# Patient Record
Sex: Male | Born: 1950 | Race: White | Hispanic: No | Smoking: Former smoker
Health system: Southern US, Community
[De-identification: ages and names within clinical notes are randomized; demographics above are authoritative.]

## PROBLEM LIST (undated history)

## (undated) ENCOUNTER — Emergency Department (HOSPITAL_COMMUNITY): Payer: Medicare PPO

## (undated) DIAGNOSIS — I10 Essential (primary) hypertension: Secondary | ICD-10-CM

## (undated) DIAGNOSIS — I82409 Acute embolism and thrombosis of unspecified deep veins of unspecified lower extremity: Secondary | ICD-10-CM

## (undated) DIAGNOSIS — I639 Cerebral infarction, unspecified: Secondary | ICD-10-CM

## (undated) DIAGNOSIS — I809 Phlebitis and thrombophlebitis of unspecified site: Secondary | ICD-10-CM

## (undated) DIAGNOSIS — F32A Depression, unspecified: Secondary | ICD-10-CM

## (undated) DIAGNOSIS — E785 Hyperlipidemia, unspecified: Secondary | ICD-10-CM

## (undated) DIAGNOSIS — I517 Cardiomegaly: Secondary | ICD-10-CM

## (undated) DIAGNOSIS — J189 Pneumonia, unspecified organism: Secondary | ICD-10-CM

## (undated) DIAGNOSIS — M5430 Sciatica, unspecified side: Secondary | ICD-10-CM

## (undated) DIAGNOSIS — K219 Gastro-esophageal reflux disease without esophagitis: Secondary | ICD-10-CM

## (undated) DIAGNOSIS — M5431 Sciatica, right side: Secondary | ICD-10-CM

## (undated) DIAGNOSIS — G4730 Sleep apnea, unspecified: Secondary | ICD-10-CM

## (undated) DIAGNOSIS — J690 Pneumonitis due to inhalation of food and vomit: Secondary | ICD-10-CM

## (undated) DIAGNOSIS — Z9981 Dependence on supplemental oxygen: Secondary | ICD-10-CM

## (undated) DIAGNOSIS — F419 Anxiety disorder, unspecified: Secondary | ICD-10-CM

## (undated) DIAGNOSIS — M1990 Unspecified osteoarthritis, unspecified site: Secondary | ICD-10-CM

## (undated) DIAGNOSIS — F329 Major depressive disorder, single episode, unspecified: Secondary | ICD-10-CM

## (undated) DIAGNOSIS — N200 Calculus of kidney: Secondary | ICD-10-CM

## (undated) DIAGNOSIS — E119 Type 2 diabetes mellitus without complications: Secondary | ICD-10-CM

## (undated) HISTORY — PX: NASAL SEPTUM SURGERY: SHX37

## (undated) HISTORY — PX: KNEE CARTILAGE SURGERY: SHX688

## (undated) HISTORY — PX: FRACTURE SURGERY: SHX138

## (undated) HISTORY — PX: TONSILLECTOMY: SUR1361

## (undated) HISTORY — DX: Type 2 diabetes mellitus without complications: E11.9

---

## 1970-12-25 HISTORY — PX: FEMUR FRACTURE SURGERY: SHX633

## 2014-04-18 ENCOUNTER — Encounter (HOSPITAL_BASED_OUTPATIENT_CLINIC_OR_DEPARTMENT_OTHER): Payer: Self-pay | Admitting: Emergency Medicine

## 2014-04-18 ENCOUNTER — Emergency Department (HOSPITAL_BASED_OUTPATIENT_CLINIC_OR_DEPARTMENT_OTHER)
Admission: EM | Admit: 2014-04-18 | Discharge: 2014-04-18 | Disposition: A | Discharge status: Home / Self Care | Payer: Self-pay | Attending: Emergency Medicine | Admitting: Emergency Medicine

## 2014-04-18 DIAGNOSIS — T40605A Adverse effect of unspecified narcotics, initial encounter: Secondary | ICD-10-CM | POA: Insufficient documentation

## 2014-04-18 DIAGNOSIS — I10 Essential (primary) hypertension: Secondary | ICD-10-CM | POA: Insufficient documentation

## 2014-04-18 DIAGNOSIS — Z87891 Personal history of nicotine dependence: Secondary | ICD-10-CM | POA: Insufficient documentation

## 2014-04-18 DIAGNOSIS — T50905A Adverse effect of unspecified drugs, medicaments and biological substances, initial encounter: Secondary | ICD-10-CM

## 2014-04-18 DIAGNOSIS — M5430 Sciatica, unspecified side: Secondary | ICD-10-CM | POA: Insufficient documentation

## 2014-04-18 HISTORY — DX: Sleep apnea, unspecified: G47.30

## 2014-04-18 HISTORY — DX: Phlebitis and thrombophlebitis of unspecified site: I80.9

## 2014-04-18 HISTORY — DX: Sciatica, unspecified side: M54.30

## 2014-04-18 HISTORY — DX: Cardiomegaly: I51.7

## 2014-04-18 HISTORY — DX: Essential (primary) hypertension: I10

## 2014-04-18 MED ORDER — LISINOPRIL 40 MG PO TABS: 40.0000 mg | ORAL_TABLET | Freq: Every day | ORAL | Status: DC

## 2014-04-18 MED ORDER — HYDROCODONE-ACETAMINOPHEN 5-325 MG PO TABS: 1.0000 | ORAL_TABLET | Freq: Four times a day (QID) | ORAL | Status: DC | PRN

## 2014-04-18 MED ORDER — DIAZEPAM 5 MG PO TABS: 5.0000 mg | ORAL_TABLET | Freq: Three times a day (TID) | ORAL | Status: DC | PRN

## 2014-04-18 NOTE — ED Provider Notes (Signed)
CSN: [id number]     Arrival date & time 04/18/14  0413 History   First MD Initiated Contact with Patient 04/18/14 [phone]     Chief Complaint  Patient presents with  . Anxiety     (Consider location/radiation/quality/duration/timing/severity/associated sxs/prior Treatment) HPI This is a 63 year old male with a history of sciatica. His episodes of sciatica usually last about 3 days and resolve on their own. He's been having sciatic pain bilaterally, right greater than left, for about 2 weeks now. He had been taking 800 mg of ibuprofen 3 times a day. For the last 3 days he has been taking his mother's oxycodone tablets as needed for acute exacerbations of the pain. Since about 2:00 this morning he has felt anxious, agitated, fearful of going to sleep and claustrophobic. He is not accustomed to such symptoms and attributes it to the oxycodone. He states he has not taken any narcotic in years. Ortho-last date controlled substances data base shows no controlled substances dispensed to him in the past year.   Past Medical History  Diagnosis Date  . Sciatica   . Hypertension   . Enlarged heart   . Phlebitis   . Sleep apnea    Past Surgical History  Procedure Laterality Date  . Knee cartilage surgery    . Femur fracture surgery     History reviewed. No pertinent family history. History  Substance Use Topics  . Smoking status: Former Games developermoker  . Smokeless tobacco: Not on file  . Alcohol Use: Yes    Review of Systems  All other systems reviewed and are negative.   Allergies  Ivp dye  Home Medications   Prior to Admission medications   Medication Sig Start Date End Date Taking? Authorizing Provider  lisinopril (PRINIVIL,ZESTRIL) 40 MG tablet Take 40 mg by mouth daily.    Historical Provider, MD   BP 175/82  Pulse 74  Temp(Src) 97.8 F (36.6 C) (Oral)  Resp 20  Ht 5' 11.5" (1.816 m)  Wt 275 lb (124.739 kg)  BMI 37.82 kg/m2  SpO2 98%  Physical Exam General: Well-developed,  well-nourished male in no acute distress; appearance consistent with age of record HENT: normocephalic; atraumatic Eyes: pupils equal, round and reactive to light; extraocular muscles intact Neck: supple Heart: regular rate and rhythm Lungs: clear to auscultation bilaterally Abdomen: soft; nondistended Back: Mild right sciatic notch tenderness; negative left straight leg raise; positive right straight leg raise at about 75 Extremities: No deformity; full range of motion; pulses normal Neurologic: Awake, alert and oriented; motor function intact in all extremities and symmetric; no facial droop Skin: Warm and dry Psychiatric: Anxious    ED Course  Procedures (including critical care time)   MDM  We will have him discontinue the oxycodone as this may be causing his anxiety. We will have him take hydrocodone as this may be more tolerated.    Hanley SeamenJohn L Shlok Raz, MD 04/18/14 [phone]

## 2014-04-18 NOTE — ED Notes (Signed)
Pt ambulating independently w/ steady gait on d/c in no acute distress, A&Ox4. D/c instructions reviewed w/ pt - pt denies any further questions or concerns at present. Rx given x3  

## 2014-04-18 NOTE — ED Notes (Signed)
Pt reports hx of sciatica pain - pt recently tried taking his mother's oxycodone x3 days, today pt has been experiencing anxiety and feeling "jittery" - pt attributing this to possible side effects of medication. Pt has also been of BP medication x1 month.

## 2014-10-27 ENCOUNTER — Encounter (HOSPITAL_BASED_OUTPATIENT_CLINIC_OR_DEPARTMENT_OTHER): Payer: Self-pay

## 2014-10-27 ENCOUNTER — Emergency Department (HOSPITAL_BASED_OUTPATIENT_CLINIC_OR_DEPARTMENT_OTHER): Payer: Self-pay

## 2014-10-27 ENCOUNTER — Emergency Department (HOSPITAL_BASED_OUTPATIENT_CLINIC_OR_DEPARTMENT_OTHER)
Admission: EM | Admit: 2014-10-27 | Discharge: 2014-10-27 | Disposition: A | Discharge status: Home / Self Care | Payer: Self-pay | Attending: Emergency Medicine | Admitting: Emergency Medicine

## 2014-10-27 DIAGNOSIS — Z8739 Personal history of other diseases of the musculoskeletal system and connective tissue: Secondary | ICD-10-CM | POA: Insufficient documentation

## 2014-10-27 DIAGNOSIS — I10 Essential (primary) hypertension: Secondary | ICD-10-CM | POA: Insufficient documentation

## 2014-10-27 DIAGNOSIS — Z79899 Other long term (current) drug therapy: Secondary | ICD-10-CM | POA: Insufficient documentation

## 2014-10-27 DIAGNOSIS — Z87891 Personal history of nicotine dependence: Secondary | ICD-10-CM | POA: Insufficient documentation

## 2014-10-27 DIAGNOSIS — R0602 Shortness of breath: Secondary | ICD-10-CM | POA: Insufficient documentation

## 2014-10-27 LAB — COMPREHENSIVE METABOLIC PANEL
ALT: 17 U/L (ref 0–53)
AST: 24 U/L (ref 0–37)
Albumin: 4 g/dL (ref 3.5–5.2)
Alkaline Phosphatase: 70 U/L (ref 39–117)
Anion gap: 12 (ref 5–15)
BUN: 11 mg/dL (ref 6–23)
CO2: 27 mEq/L (ref 19–32)
Calcium: 9.8 mg/dL (ref 8.4–10.5)
Chloride: 101 mEq/L (ref 96–112)
Creatinine, Ser: 1 mg/dL (ref 0.50–1.35)
GFR calc Af Amer: >90 mL/min (ref >90)
GFR calc non Af Amer: 78 mL/min — ABNORMAL LOW (ref >90)
Glucose, Bld: 130 mg/dL — ABNORMAL HIGH (ref 70–99)
Potassium: 4.9 mEq/L (ref 3.7–5.3)
Sodium: 140 mEq/L (ref 137–147)
Total Bilirubin: 0.5 mg/dL (ref 0.3–1.2)
Total Protein: 7.9 g/dL (ref 6.0–8.3)

## 2014-10-27 LAB — CBC WITH DIFFERENTIAL/PLATELET
Basophils Absolute: 0 10*3/uL (ref 0.0–0.1)
Basophils Relative: 1 % (ref 0–1)
Eosinophils Absolute: 0.4 10*3/uL (ref 0.0–0.7)
Eosinophils Relative: 5 % (ref 0–5)
HCT: 44.1 % (ref 39.0–52.0)
Hemoglobin: 14.7 g/dL (ref 13.0–17.0)
Lymphocytes Relative: 16 % (ref 12–46)
Lymphs Abs: 1.4 10*3/uL (ref 0.7–4.0)
MCH: 31.2 pg (ref 26.0–34.0)
MCHC: 33.3 g/dL (ref 30.0–36.0)
MCV: 93.6 fL (ref 78.0–100.0)
Monocytes Absolute: 0.5 10*3/uL (ref 0.1–1.0)
Monocytes Relative: 6 % (ref 3–12)
Neutro Abs: 6.5 10*3/uL (ref 1.7–7.7)
Neutrophils Relative %: 72 % (ref 43–77)
Platelets: 227 10*3/uL (ref 150–400)
RBC: 4.71 MIL/uL (ref 4.22–5.81)
RDW: 13.8 % (ref 11.5–15.5)
WBC: 8.8 10*3/uL (ref 4.0–10.5)

## 2014-10-27 LAB — TROPONIN I: Troponin I: <0.3 ng/mL (ref <0.30)

## 2014-10-27 LAB — D-DIMER, QUANTITATIVE: D-Dimer, Quant: 0.32 ug/mL-FEU (ref 0.00–0.48)

## 2014-10-27 LAB — PRO B NATRIURETIC PEPTIDE: Pro B Natriuretic peptide (BNP): 145.9 pg/mL — ABNORMAL HIGH (ref 0–125)

## 2014-10-27 MED ORDER — LORAZEPAM 2 MG/ML IJ SOLN
1.0000 mg | Freq: Once | INTRAMUSCULAR | Status: AC
  Administered 2014-10-27: 1 mg via INTRAVENOUS
  Filled 2014-10-27: qty 1

## 2014-10-27 NOTE — Discharge Instructions (Signed)

## 2014-10-27 NOTE — ED Notes (Signed)
Walked into the room and the pt was very anxious and standing up. Pt stated that he just couldn't get comfortable and was very anxious. Pt stated that he closed his eyes and woke up and was startled and stated that he was breathing hard. Pt stated that he is scared to go to sleep because he is afraid that he will stop breathing.

## 2014-10-27 NOTE — ED Provider Notes (Signed)
CSN: [id number]     Arrival date & time 10/27/14  0820 History   First MD Initiated Contact with Patient 10/27/14 0857     Chief Complaint  Patient presents with  . Shortness of Breath     (Consider location/radiation/quality/duration/timing/severity/associated sxs/prior Treatment) HPI Comments: Patient is a 63 year old male with history of hypertension, DVT, and a "enlarged heart". He presents with complaints of shortness of breath that started yesterday evening around 9:00. He states that he is having increased discomfort from sciatica and took 2 of his mother's Percocet. When he tried to lie flat he became short of breath and "stops breathing". He is been awake all night as he has been afraid to go to sleep. He presents this morning for evaluation of the above complaints. He denies to me he is having any chest pain. He denies any fevers or chills. He denies any productive cough.   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  Patient is a 63 y.o. male presenting with shortness of breath. The history is provided by the patient.  Shortness of Breath Severity:  Moderate Onset quality:  Sudden Duration:  12 hours Timing:  Constant Progression:  Unchanged Chronicity:  New Relieved by:  Nothing Ineffective treatments:  None tried Associated symptoms: no chest pain, no cough and no fever     Past Medical History  Diagnosis Date  . Sciatica   . Hypertension   . Enlarged heart   . Phlebitis   . Sleep apnea    Past Surgical History  Procedure Laterality Date  . Knee cartilage surgery    . Femur fracture surgery     No family history on file. History  Substance Use Topics  . Smoking status: Former Smoker    Quit date: 10/27/1992  . Smokeless tobacco: Never Used  . Alcohol Use: Yes     Comment: Pt stated that he drinks scotch and wine on weekends    Review of Systems  Constitutional: Negative for fever.   Respiratory: Positive for shortness of breath. Negative for cough.   Cardiovascular: Negative for chest pain.  All other systems reviewed and are negative.     Allergies  Ivp dye  Home Medications   Prior to Admission medications   Medication Sig Start Date End Date Taking? Authorizing Provider  oxyCODONE (ROXICODONE) 15 MG immediate release tablet Take 15 mg by mouth every 4 (four) hours as needed for pain (Pt stated that he took oxycodone last evening at 1700 and again at midnight today for sciatica).   Yes Historical Provider, MD  lisinopril (PRINIVIL,ZESTRIL) 40 MG tablet Take 40 mg by mouth daily.    Historical Provider, MD  lisinopril (PRINIVIL,ZESTRIL) 40 MG tablet Take 1 tablet (40 mg total) by mouth daily. 04/18/14   John L Molpus, MD   BP 188/97 mmHg  Pulse 66  Temp(Src) 98.5 F (36.9 C) (Oral)  Resp 22  Ht 5\' 11"  (1.803 m)  Wt 280 lb (127.007 kg)  BMI 39.07 kg/m2  SpO2 100% Physical Exam  Constitutional: He is oriented to person, place, and time. He appears well-developed and well-nourished. No distress.  HENT:  Head: Normocephalic and atraumatic.  Neck: Normal range of motion. Neck supple.  Cardiovascular: Normal rate, regular rhythm and normal heart sounds.   No murmur heard. Pulmonary/Chest: Effort normal and breath sounds normal. No respiratory distress. He has no wheezes.  Abdominal: Soft. Bowel sounds  are normal.  Musculoskeletal:  The left lower extremity is enlarged in comparison to the right lower extremity. The patient tells me that this is normal for him. There is no pitting edema present. There is no calf tenderness bilaterally and Homans sign is absent bilaterally.  Neurological: He is alert and oriented to person, place, and time.  Skin: Skin is warm and dry. He is not diaphoretic.  Nursing note and vitals reviewed.   ED Course  Procedures (including critical care time) Labs Review Labs Reviewed  COMPREHENSIVE METABOLIC PANEL  CBC WITH  DIFFERENTIAL  D-DIMER, QUANTITATIVE  TROPONIN I  PRO B NATRIURETIC PEPTIDE    Imaging Review No results found.   EKG Interpretation   Date/Time:  Tuesday October 27 2014 09:05:55 EST Ventricular Rate:  69 PR Interval:  170 QRS Duration: 100 QT Interval:  428 QTC Calculation: 458 R Axis:   -12 Text Interpretation:  Sinus rhythm with occasional Premature ventricular  complexes Otherwise normal ECG Confirmed by DELOS  MD, Lavonne Kinderman ([id number]) on  10/27/2014 9:48:56 AM      MDM   Final diagnoses:  None    Patient presents with complaints of difficulty breathing. He states this started last night when he attempted to lie down. He had just taken 2 of his mother's Percocet prior to the onset of symptoms and it is possible that this could be a reaction to the medication. His workup reveals no evidence for a cardiopulmonary abnormality. Considered and ruled out are pulmonary embolism, congestive heart failure, pneumonia. What he describes sounds like sleep apnea. He has had this in the past, however could not complete a sleep study due to anxiety. He appears to be feeling better and oxygen saturations have remained stable. He was able to rest and sleep here for a little while without difficulty. At this point I feel as though he is appropriate for discharge, to follow-up with his doctor and return as needed if he worsens.    Geoffery Lyonsouglas Daritza Brees, MD 10/27/14 1128

## 2014-10-27 NOTE — ED Notes (Signed)
MD at bedside with this RN 

## 2014-10-27 NOTE — ED Notes (Signed)
Pt stated that he started having difficulty breathing last night around 2100. Pt stated that he started using his mother's oxygen bottles set at 3 Liters and it helped until this morning around 0200 the pt woke up and stated that his face was "feeling funny" and he was scared to go back to sleep. The pt stated that he got up at 0400 and is short of breath. Pt also stated that he took an oxycodone at 1700 yesterday afternoon and then another one at midnight for sciatica/back pain.

## 2015-12-10 ENCOUNTER — Inpatient Hospital Stay (HOSPITAL_COMMUNITY)
Admission: EM | Admit: 2015-12-10 | Discharge: 2015-12-12 | DRG: 064 | Disposition: A | Discharge status: Home / Self Care | Payer: Medicare Other | Attending: Internal Medicine | Admitting: Internal Medicine

## 2015-12-10 ENCOUNTER — Inpatient Hospital Stay (HOSPITAL_COMMUNITY): Payer: Medicare Other

## 2015-12-10 ENCOUNTER — Emergency Department (HOSPITAL_COMMUNITY): Payer: Medicare Other

## 2015-12-10 ENCOUNTER — Encounter (HOSPITAL_COMMUNITY): Payer: Self-pay | Admitting: Emergency Medicine

## 2015-12-10 DIAGNOSIS — R471 Dysarthria and anarthria: Secondary | ICD-10-CM | POA: Diagnosis present

## 2015-12-10 DIAGNOSIS — G8929 Other chronic pain: Secondary | ICD-10-CM | POA: Diagnosis present

## 2015-12-10 DIAGNOSIS — Z91041 Radiographic dye allergy status: Secondary | ICD-10-CM

## 2015-12-10 DIAGNOSIS — G4733 Obstructive sleep apnea (adult) (pediatric): Secondary | ICD-10-CM | POA: Diagnosis present

## 2015-12-10 DIAGNOSIS — M25551 Pain in right hip: Secondary | ICD-10-CM | POA: Diagnosis present

## 2015-12-10 DIAGNOSIS — I6339 Cerebral infarction due to thrombosis of other cerebral artery: Secondary | ICD-10-CM

## 2015-12-10 DIAGNOSIS — E669 Obesity, unspecified: Secondary | ICD-10-CM | POA: Diagnosis present

## 2015-12-10 DIAGNOSIS — G8194 Hemiplegia, unspecified affecting left nondominant side: Secondary | ICD-10-CM | POA: Diagnosis present

## 2015-12-10 DIAGNOSIS — K219 Gastro-esophageal reflux disease without esophagitis: Secondary | ICD-10-CM | POA: Diagnosis present

## 2015-12-10 DIAGNOSIS — R0602 Shortness of breath: Secondary | ICD-10-CM

## 2015-12-10 DIAGNOSIS — I10 Essential (primary) hypertension: Secondary | ICD-10-CM | POA: Diagnosis not present

## 2015-12-10 DIAGNOSIS — F1410 Cocaine abuse, uncomplicated: Secondary | ICD-10-CM | POA: Diagnosis present

## 2015-12-10 DIAGNOSIS — I6789 Other cerebrovascular disease: Secondary | ICD-10-CM

## 2015-12-10 DIAGNOSIS — W19XXXA Unspecified fall, initial encounter: Secondary | ICD-10-CM | POA: Diagnosis present

## 2015-12-10 DIAGNOSIS — M5430 Sciatica, unspecified side: Secondary | ICD-10-CM | POA: Diagnosis present

## 2015-12-10 DIAGNOSIS — I739 Peripheral vascular disease, unspecified: Secondary | ICD-10-CM | POA: Diagnosis present

## 2015-12-10 DIAGNOSIS — J9601 Acute respiratory failure with hypoxia: Secondary | ICD-10-CM | POA: Diagnosis not present

## 2015-12-10 DIAGNOSIS — E785 Hyperlipidemia, unspecified: Secondary | ICD-10-CM | POA: Diagnosis present

## 2015-12-10 DIAGNOSIS — E119 Type 2 diabetes mellitus without complications: Secondary | ICD-10-CM | POA: Diagnosis present

## 2015-12-10 DIAGNOSIS — Z6836 Body mass index (BMI) 36.0-36.9, adult: Secondary | ICD-10-CM

## 2015-12-10 DIAGNOSIS — J690 Pneumonitis due to inhalation of food and vomit: Secondary | ICD-10-CM | POA: Diagnosis present

## 2015-12-10 DIAGNOSIS — I639 Cerebral infarction, unspecified: Secondary | ICD-10-CM

## 2015-12-10 DIAGNOSIS — Z87891 Personal history of nicotine dependence: Secondary | ICD-10-CM

## 2015-12-10 DIAGNOSIS — D72829 Elevated white blood cell count, unspecified: Secondary | ICD-10-CM | POA: Diagnosis present

## 2015-12-10 DIAGNOSIS — Z79899 Other long term (current) drug therapy: Secondary | ICD-10-CM

## 2015-12-10 HISTORY — DX: Pneumonitis due to inhalation of food and vomit: J69.0

## 2015-12-10 HISTORY — DX: Cerebral infarction, unspecified: I63.9

## 2015-12-10 LAB — I-STAT CHEM 8, ED
BUN: 16 mg/dL (ref 6–20)
Calcium, Ion: 1.22 mmol/L (ref 1.13–1.30)
Chloride: 102 mmol/L (ref 101–111)
Creatinine, Ser: 0.9 mg/dL (ref 0.61–1.24)
Glucose, Bld: 118 mg/dL — ABNORMAL HIGH (ref 65–99)
HCT: 46 % (ref 39.0–52.0)
Hemoglobin: 15.6 g/dL (ref 13.0–17.0)
Potassium: 4.3 mmol/L (ref 3.5–5.1)
Sodium: 141 mmol/L (ref 135–145)
TCO2: 27 mmol/L (ref 0–100)

## 2015-12-10 LAB — CBC WITH DIFFERENTIAL/PLATELET
Basophils Absolute: 0 10*3/uL (ref 0.0–0.1)
Basophils Relative: 0 %
EOS PCT: 1 %
Eosinophils Absolute: 0.3 10*3/uL (ref 0.0–0.7)
HEMATOCRIT: 41.5 % (ref 39.0–52.0)
HEMOGLOBIN: 13.4 g/dL (ref 13.0–17.0)
LYMPHS PCT: 5 %
Lymphs Abs: 1.4 10*3/uL (ref 0.7–4.0)
MCH: 29.8 pg (ref 26.0–34.0)
MCHC: 32.3 g/dL (ref 30.0–36.0)
MCV: 92.2 fL (ref 78.0–100.0)
MONO ABS: 2 10*3/uL — AB (ref 0.1–1.0)
MONOS PCT: 7 %
NEUTROS PCT: 87 %
Neutro Abs: 24.8 10*3/uL — ABNORMAL HIGH (ref 1.7–7.7)
PLATELETS: 255 10*3/uL (ref 150–400)
RBC: 4.5 MIL/uL (ref 4.22–5.81)
RDW: 14.7 % (ref 11.5–15.5)
WBC MORPHOLOGY: INCREASED
WBC: 28.5 10*3/uL — AB (ref 4.0–10.5)

## 2015-12-10 LAB — COMPREHENSIVE METABOLIC PANEL
ALBUMIN: 3.5 g/dL (ref 3.5–5.0)
ALBUMIN: 3.1 g/dL — AB (ref 3.5–5.0)
ALK PHOS: 66 U/L (ref 38–126)
ALT: 18 U/L (ref 17–63)
ALT: 19 U/L (ref 17–63)
ANION GAP: 9 (ref 5–15)
ANION GAP: 9 (ref 5–15)
AST: 27 U/L (ref 15–41)
AST: 23 U/L (ref 15–41)
Alkaline Phosphatase: 65 U/L (ref 38–126)
BILIRUBIN TOTAL: 0.9 mg/dL (ref 0.3–1.2)
BUN: 13 mg/dL (ref 6–20)
BUN: 12 mg/dL (ref 6–20)
CALCIUM: 9.2 mg/dL (ref 8.9–10.3)
CHLORIDE: 104 mmol/L (ref 101–111)
CO2: 25 mmol/L (ref 22–32)
CO2: 26 mmol/L (ref 22–32)
Calcium: 9.4 mg/dL (ref 8.9–10.3)
Chloride: 104 mmol/L (ref 101–111)
Creatinine, Ser: 1.01 mg/dL (ref 0.61–1.24)
Creatinine, Ser: 0.99 mg/dL (ref 0.61–1.24)
GFR calc Af Amer: >60 mL/min (ref >60)
GFR calc Af Amer: >60 mL/min (ref >60)
GFR calc non Af Amer: >60 mL/min (ref >60)
GLUCOSE: 115 mg/dL — AB (ref 65–99)
GLUCOSE: 118 mg/dL — AB (ref 65–99)
POTASSIUM: 4.3 mmol/L (ref 3.5–5.1)
Potassium: 4.3 mmol/L (ref 3.5–5.1)
SODIUM: 139 mmol/L (ref 135–145)
Sodium: 138 mmol/L (ref 135–145)
TOTAL PROTEIN: 6.8 g/dL (ref 6.5–8.1)
Total Bilirubin: 0.8 mg/dL (ref 0.3–1.2)
Total Protein: 6.5 g/dL (ref 6.5–8.1)

## 2015-12-10 LAB — LIPID PANEL
CHOL/HDL RATIO: 4.6 ratio
Cholesterol: 123 mg/dL (ref 0–200)
HDL: 27 mg/dL — AB (ref >40)
LDL Cholesterol: 80 mg/dL (ref 0–99)
Triglycerides: 81 mg/dL (ref <150)
VLDL: 16 mg/dL (ref 0–40)

## 2015-12-10 LAB — URINALYSIS, ROUTINE W REFLEX MICROSCOPIC
Glucose, UA: NEGATIVE mg/dL
Hgb urine dipstick: NEGATIVE
KETONES UR: NEGATIVE mg/dL
LEUKOCYTES UA: NEGATIVE
NITRITE: NEGATIVE
PH: 7 (ref 5.0–8.0)
PROTEIN: NEGATIVE mg/dL
Specific Gravity, Urine: 1.029 (ref 1.005–1.030)

## 2015-12-10 LAB — DIFFERENTIAL
BASOS ABS: 0 10*3/uL (ref 0.0–0.1)
BASOS PCT: 0 %
EOS ABS: 0.1 10*3/uL (ref 0.0–0.7)
EOS PCT: 0 %
LYMPHS ABS: 0.7 10*3/uL (ref 0.7–4.0)
Lymphocytes Relative: 3 %
Monocytes Absolute: 1.6 10*3/uL — ABNORMAL HIGH (ref 0.1–1.0)
Monocytes Relative: 7 %
NEUTROS PCT: 90 %
Neutro Abs: 21.5 10*3/uL — ABNORMAL HIGH (ref 1.7–7.7)

## 2015-12-10 LAB — RAPID URINE DRUG SCREEN, HOSP PERFORMED
Amphetamines: NOT DETECTED
Barbiturates: NOT DETECTED
Benzodiazepines: POSITIVE — AB
Cocaine: POSITIVE — AB
OPIATES: POSITIVE — AB
TETRAHYDROCANNABINOL: NOT DETECTED

## 2015-12-10 LAB — CBC
HCT: 43.4 % (ref 39.0–52.0)
Hemoglobin: 14 g/dL (ref 13.0–17.0)
MCH: 29.6 pg (ref 26.0–34.0)
MCHC: 32.3 g/dL (ref 30.0–36.0)
MCV: 91.8 fL (ref 78.0–100.0)
Platelets: 229 K/uL (ref 150–400)
RBC: 4.73 MIL/uL (ref 4.22–5.81)
RDW: 14.6 % (ref 11.5–15.5)
WBC: 23.9 K/uL — ABNORMAL HIGH (ref 4.0–10.5)

## 2015-12-10 LAB — I-STAT TROPONIN, ED: TROPONIN I, POC: 0.03 ng/mL (ref 0.00–0.08)

## 2015-12-10 LAB — LACTIC ACID, PLASMA: LACTIC ACID, VENOUS: 1.4 mmol/L (ref 0.5–2.0)

## 2015-12-10 LAB — CK: Total CK: 57 U/L (ref 49–397)

## 2015-12-10 LAB — PROTIME-INR
INR: 1.14 (ref 0.00–1.49)
Prothrombin Time: 14.8 s (ref 11.6–15.2)

## 2015-12-10 LAB — CBG MONITORING, ED: Glucose-Capillary: 105 mg/dL — ABNORMAL HIGH (ref 65–99)

## 2015-12-10 LAB — APTT: APTT: 30 s (ref 24–37)

## 2015-12-10 LAB — PROCALCITONIN: PROCALCITONIN: 0.81 ng/mL

## 2015-12-10 MED ORDER — ALBUTEROL SULFATE (2.5 MG/3ML) 0.083% IN NEBU: 2.5000 mg | INHALATION_SOLUTION | RESPIRATORY_TRACT | Status: DC | PRN

## 2015-12-10 MED ORDER — MORPHINE SULFATE (PF) 2 MG/ML IV SOLN
2.0000 mg | INTRAVENOUS | Status: DC | PRN
  Administered 2015-12-10 – 2015-12-12 (×5): 2 mg via INTRAVENOUS
  Filled 2015-12-10 (×5): qty 1

## 2015-12-10 MED ORDER — ASPIRIN 300 MG RE SUPP
300.0000 mg | Freq: Every day | RECTAL | Status: DC
Start: 2015-12-10 — End: 2015-12-12
  Filled 2015-12-10: qty 1

## 2015-12-10 MED ORDER — ACETAMINOPHEN 325 MG PO TABS
650.0000 mg | ORAL_TABLET | Freq: Four times a day (QID) | ORAL | Status: DC | PRN
Start: 2015-12-10 — End: 2015-12-10

## 2015-12-10 MED ORDER — SODIUM CHLORIDE 0.9 % IV SOLN
INTRAVENOUS | Status: DC
  Administered 2015-12-10: 06:00:00 via INTRAVENOUS

## 2015-12-10 MED ORDER — OXYCODONE HCL 5 MG PO TABS
10.0000 mg | ORAL_TABLET | Freq: Four times a day (QID) | ORAL | Status: DC | PRN
  Administered 2015-12-10 – 2015-12-12 (×6): 10 mg via ORAL
  Filled 2015-12-10 (×7): qty 2

## 2015-12-10 MED ORDER — HYDRALAZINE HCL 20 MG/ML IJ SOLN: 10.0000 mg | INTRAMUSCULAR | Status: DC | PRN

## 2015-12-10 MED ORDER — ACETAMINOPHEN 325 MG PO TABS: 650.0000 mg | ORAL_TABLET | Freq: Four times a day (QID) | ORAL | Status: DC | PRN

## 2015-12-10 MED ORDER — SENNOSIDES-DOCUSATE SODIUM 8.6-50 MG PO TABS: 1.0000 | ORAL_TABLET | Freq: Every evening | ORAL | Status: DC | PRN

## 2015-12-10 MED ORDER — ASPIRIN 325 MG PO TABS
325.0000 mg | ORAL_TABLET | Freq: Every day | ORAL | Status: DC
  Administered 2015-12-10 – 2015-12-12 (×3): 325 mg via ORAL
  Filled 2015-12-10 (×3): qty 1

## 2015-12-10 MED ORDER — ATORVASTATIN CALCIUM 10 MG PO TABS
20.0000 mg | ORAL_TABLET | Freq: Every day | ORAL | Status: DC
  Administered 2015-12-10 – 2015-12-11 (×2): 20 mg via ORAL
  Filled 2015-12-10 (×2): qty 2

## 2015-12-10 MED ORDER — OXYCODONE HCL 5 MG PO TABS
10.0000 mg | ORAL_TABLET | Freq: Four times a day (QID) | ORAL | Status: DC | PRN
  Administered 2015-12-10: 10 mg via ORAL
  Filled 2015-12-10: qty 2

## 2015-12-10 MED ORDER — ACETAMINOPHEN 650 MG RE SUPP: 650.0000 mg | RECTAL | Status: DC | PRN

## 2015-12-10 MED ORDER — AMPICILLIN-SULBACTAM SODIUM 3 (2-1) G IJ SOLR
3.0000 g | Freq: Three times a day (TID) | INTRAMUSCULAR | Status: DC
  Administered 2015-12-10 – 2015-12-12 (×6): 3 g via INTRAVENOUS
  Filled 2015-12-10 (×7): qty 3

## 2015-12-10 MED ORDER — PANTOPRAZOLE SODIUM 40 MG PO TBEC
40.0000 mg | DELAYED_RELEASE_TABLET | Freq: Every day | ORAL | Status: DC
  Administered 2015-12-10 – 2015-12-12 (×3): 40 mg via ORAL
  Filled 2015-12-10 (×3): qty 1

## 2015-12-10 MED ORDER — IPRATROPIUM-ALBUTEROL 0.5-2.5 (3) MG/3ML IN SOLN
3.0000 mL | Freq: Four times a day (QID) | RESPIRATORY_TRACT | Status: DC
  Administered 2015-12-10 – 2015-12-12 (×7): 3 mL via RESPIRATORY_TRACT
  Filled 2015-12-10 (×8): qty 3

## 2015-12-10 MED ORDER — ENOXAPARIN SODIUM 40 MG/0.4ML ~~LOC~~ SOLN
40.0000 mg | SUBCUTANEOUS | Status: DC
Start: 2015-12-10 — End: 2015-12-12
  Administered 2015-12-10 – 2015-12-12 (×3): 40 mg via SUBCUTANEOUS
  Filled 2015-12-10 (×3): qty 0.4

## 2015-12-10 MED ORDER — STROKE: EARLY STAGES OF RECOVERY BOOK
Freq: Once | Status: AC
  Administered 2015-12-10: 1
  Filled 2015-12-10: qty 1

## 2015-12-10 NOTE — H&P (Signed)
[facility] History and Physical  George ErmJames Gatt ZOX:[id number] DOB: 07-10-1951 DOA: 12/10/2015  Referring physician: Dr.Oni. PCP: Emilio AspenJACOBUCCI,NICOLA J, MD  Specialists: None.  Chief Complaint: Left-sided weakness.  HPI: George Frank is a 64 y.o. male with history of hypertension presently on no medications since we have because of left-sided weakness. Patient's symptoms started last evening around 4 PM. At around 8 PM and patient came back from the bathroom and sat on the bed patient slowly slid to the floor. In the ER patient was found to have left upper and lower extremity weakness and slurred speech with some facial asymmetry. CT of the head did not show anything acute. On-call neurologist Dr. Noel Christmasharles Stewart was consulted. Patient has been admitted for stroke. Patient states he has not been taking antihypertensives for last 5 years after his blood pressure was found to be improved. Patient also has leukocytosis with mild fever. Has mild cough but chest x-ray and physical exam does not show any definite evidence of pneumonia. Denies any dysuria and UA is pending. Denies any nausea vomiting or diarrhea.  Review of Systems: As presented in the history of presenting illness, rest negative.  Past Medical History  Diagnosis Date  . Sciatica   . Hypertension   . Enlarged heart   . Phlebitis   . Sleep apnea    Past Surgical History  Procedure Laterality Date  . Knee cartilage surgery    . Femur fracture surgery     Social History:  reports that he quit smoking about 23 years ago. He has never used smokeless tobacco. He reports that he drinks alcohol. He reports that he uses illicit drugs (Cocaine). Where does patient live home. Can patient participate in ADLs? Yes.  Allergies  Allergen Reactions  . Ivp Dye [Iodinated Diagnostic Agents]     Family History:  Family History  Problem Relation Age of Onset  . Stroke Father   . Diabetes Mellitus II Neg Hx       Prior to  Admission medications   Medication Sig Start Date End Date Taking? Authorizing Provider  lisinopril (PRINIVIL,ZESTRIL) 40 MG tablet Take 1 tablet (40 mg total) by mouth daily. Patient not taking: Reported on 12/10/2015 04/18/14   Paula LibraJohn Molpus, MD    Physical Exam: Filed Vitals:   12/10/15 0345 12/10/15 0400 12/10/15 0405 12/10/15 0430  BP: 164/88 164/90  126/85  Pulse: 92 101  88  Temp:   98.9 F (37.2 C) 98.4 F (36.9 C)  TempSrc:    Oral  Resp: 26 17  18   Height:      Weight:      SpO2: 91% 96%  92%     General:  Moderately built and nourished.  Eyes: Anicteric no pallor.  ENT: No discharge from the ears eyes nose and mouth.  Neck: No mass felt. No neck rigidity.  Cardiovascular: S1 and S2 heard.  Respiratory: No rhonchi or crepitations.  Abdomen: Soft nontender bowel sounds present.  Skin: No rash.  Musculoskeletal: No edema.  Psychiatric: Appears normal.  Neurologic: Alert awake oriented to time place and person. Left upper extremity is 3 x 5 in strength and left lower extremity is 2 x 5 in strength. Mild left facial asymmetry. Tongue is midline. PERRLA positive.  Labs on Admission:  Basic Metabolic Panel:  Recent Labs Lab 12/10/15 0149 12/10/15 0155  NA 138 141  K 4.3 4.3  CL 104 102  CO2 25  --   GLUCOSE 115* 118*  BUN 13 16  CREATININE 1.01 0.90  CALCIUM 9.4  --    Liver Function Tests:  Recent Labs Lab 12/10/15 0149  AST 27  ALT 18  ALKPHOS 65  BILITOT 0.9  PROT 6.8  ALBUMIN 3.5   No results for input(s): LIPASE, AMYLASE in the last 168 hours. No results for input(s): AMMONIA in the last 168 hours. CBC:  Recent Labs Lab 12/10/15 0149 12/10/15 0155  WBC 23.9*  --   NEUTROABS 21.5*  --   HGB 14.0 15.6  HCT 43.4 46.0  MCV 91.8  --   PLT 229  --    Cardiac Enzymes: No results for input(s): CKTOTAL, CKMB, CKMBINDEX, TROPONINI in the last 168 hours.  BNP (last 3 results) No results for input(s): BNP in the last 8760  hours.  ProBNP (last 3 results) No results for input(s): PROBNP in the last 8760 hours.  CBG:  Recent Labs Lab 12/10/15 0148  GLUCAP 105*    Radiological Exams on Admission: Dg Chest 2 View  12/10/2015  CLINICAL DATA:  Left-sided weakness in the arm and leg. Slurred speech with asymmetrical smile. Fall. Elevated white cell count. EXAM: CHEST  2 VIEW COMPARISON:  06/22/2015 FINDINGS: Atelectasis in the lung bases. Normal heart size and pulmonary vascularity. No focal airspace disease. No blunting of costophrenic angles. No pneumothorax. Tortuous aorta. Degenerative changes in the spine. IMPRESSION: Atelectasis in the lung bases. Electronically Signed   By: Burman Nieves M.D.   On: 12/10/2015 03:15   Ct Head Wo Contrast  12/10/2015  CLINICAL DATA:  Left-sided weakness EXAM: CT HEAD WITHOUT CONTRAST TECHNIQUE: Contiguous axial images were obtained from the base of the skull through the vertex without intravenous contrast. COMPARISON:  None. FINDINGS: Ventricles and sulci appear symmetrical. Mild ventricular dilatation consistent with mild central atrophy. No mass effect or midline shift. No abnormal extra-axial fluid collections. Gray-white matter junctions are distinct. Basal cisterns are not effaced. No evidence of acute intracranial hemorrhage. No depressed skull fractures. Retention cyst in the right maxillary antrum. No acute air-fluid levels in the paranasal sinuses. Mastoid air cells are not opacified. IMPRESSION: No acute intracranial abnormalities.  Mild central atrophy. Electronically Signed   By: Burman Nieves M.D.   On: 12/10/2015 02:57    EKG: Independently reviewed. Sinus tachycardia.  Assessment/Plan Principal Problem:   Stroke with cerebral ischemia [facility]) Active Problems:   Essential hypertension   Leucocytosis   Stroke (cerebrum) (HCC)   1. Stroke with left-sided hemiplegia - appreciate neurology consult. Patient has been placed on neurochecks for evaluation.  Check MRI/MRA brain 2-D echo and carotid Doppler. Physical therapy consult. Check hemoglobin A1c and lipid panel. Aspirin. 2. Hypertension - has not been taking medications for last 5 years. For now and allow permissive hypertension due to possible stroke. I have placed patient on when necessary IV hydralazine for systolic blood pressure more than 210. 3. Leukocytosis - patient is mildly febrile. I have ordered blood cultures procalcitonin levels. There is no definite source for infection at this time. Closely follow CBC. UA is pending.   DVT Prophylaxis Lovenox.  Code Status: Full code.  Family Communication: Discussed with patient.  Disposition Plan: Admit to inpatient.    Kazuma Elena N. [facility] Pager [phone].  If 7PM-7AM, please contact night-coverage [email or web address] Password [email or web address] 12/10/2015, 4:58 AM

## 2015-12-10 NOTE — Progress Notes (Signed)
Utilization review completed. Anum Palecek, RN, BSN. 

## 2015-12-10 NOTE — Progress Notes (Signed)
PROGRESS NOTE    George Frank WJX:[id number] DOB: 1951-11-02 DOA: 12/10/2015 PCP: Emilio Aspen, MD  HPI/Brief narrative 64 year old male with history of HTN-not on medications, OSA-could not tolerate CPAP and hence not using, former heavy smoker, sciatica presented to [facility] ED with left-sided weakness and slurred speech. CT head negative but MRI confirmed acute stroke. Neurology following. On 12/16, complained of dyspnea and chest x-ray suggestive of aspiration pneumonia.   Assessment/Plan:  Stroke: Non-dominant infarct secondary to small vessel disease. Resultant facial asymmetry MRI Acute RIGHT paramedian pontine infarct MRA - Central atrophy with relatively mild small vessel disease.Dolichoectatic cerebral vasculature without proximal stenosis or dissection. Carotid Doppler - pending 2D Echo - pending LDL - 80 HgbA1c pending No antithrombotic prior to admission, now on aspirin 325 mg daily Therapy recommendations: Pending Diet: Pending speech therapy evaluation.  Right lower lobe aspiration pneumonia - Patient complains of nonproductive cough, dyspnea, has confirmed stroke and possible dysphagia related to that, low-grade fever and marked leukocytosis with bandemia - Speech therapy consulted for swallow evaluation - IV Unasyn per pharmacy - We will need follow-up chest x-ray in 3-4 weeks to ensure resolution of pneumonia findings.  Acute respiratory failure with hypoxia - Secondary to aspiration pneumonia complicating underlying OSA and possible COPD - Treat pneumonia. Oxygen, bronchodilator nebulizations - Discussed extensively with patient's nursing. If patient decompensates, will need transfer to ICU and CCM consult.  Essential hypertension - Controlled. Allow for permissive hypertension given recent stroke.  Hyperlipidemia - LDL 80, goal <70. Started Lipitor 20 MG daily  Former smoker - States that he smoked 2-3 packs per day for 40 years and quit when his  wife was pregnant with their first child. Denies alcohol abuse.  Obesity/Body mass index is 36.59 kg/(m^2).   OSA, not on CPAP   DVT prophylaxis: Lovenox Code Status: Full Family Communication: None at bedside Disposition Plan: To be determined.   Consultants:  Neurology  Procedures:  None  Antibiotics:  IV Unasyn 12/16 >   Subjective: Planed of chronic right hip pain/sciatica and asking for pain meds. Dyspnea and nonproductive cough.  Objective: Filed Vitals:   12/10/15 0400 12/10/15 0405 12/10/15 0430 12/10/15 0630  BP: 164/90  126/85 149/65  Pulse: 101  88 77  Temp:  98.9 F (37.2 C) 98.4 F (36.9 C) 97.9 F (36.6 C)  TempSrc:   Oral Oral  Resp: Height:      Weight:      SpO2: 96%  92% 96%   No intake or output data in the 24 hours ending 12/10/15 0729 Filed Weights   12/10/15 0126  Weight: 120.657 kg (266 lb)     Exam:  General exam: Moderately built and morbidly obese male patient sitting up at edge of bed this morning with mild increased work of breathing. Respiratory system: Reduced breath sounds bilaterally with scattered bilateral few medium pitched expiratory rhonchi and occasional basal crackles. No increased work of breathing. Cardiovascular system: S1 & S2 heard, RRR. No JVD, murmurs, gallops, clicks or pedal edema. Telemetry: Sinus rhythm. Gastrointestinal system: Abdomen is nondistended, soft and nontender. Normal bowel sounds heard. Central nervous system: Alert and oriented 3. Dysarthria. Facial asymmetry. No focal neurological deficits. Extremities: Symmetric 5 x 5 power.   Data Reviewed: Basic Metabolic Panel:  Recent Labs Lab 12/10/15 0149 12/10/15 0155 12/10/15 0600  NA 138 141 139  K 4.3 4.3 4.3  CL 104 102 104  CO2 25  --  26  GLUCOSE 115* 118*  118*  BUN 13 16 12   CREATININE 1.01 0.90 0.99  CALCIUM 9.4  --  9.2   Liver Function Tests:  Recent Labs Lab 12/10/15 0149 12/10/15 0600  AST 27 23  ALT 18  19  ALKPHOS 65 66  BILITOT 0.9 0.8  PROT 6.8 6.5  ALBUMIN 3.5 3.1*   No results for input(s): LIPASE, AMYLASE in the last 168 hours. No results for input(s): AMMONIA in the last 168 hours. CBC:  Recent Labs Lab 12/10/15 0149 12/10/15 0155 12/10/15 0600  WBC 23.9*  --  28.5*  NEUTROABS 21.5*  --  PENDING  HGB 14.0 15.6 13.4  HCT 43.4 46.0 41.5  MCV 91.8  --  92.2  PLT 229  --  255   Cardiac Enzymes:  Recent Labs Lab 12/10/15 0600  CKTOTAL 57   BNP (last 3 results) No results for input(s): PROBNP in the last 8760 hours. CBG:  Recent Labs Lab 12/10/15 0148  GLUCAP 105*    No results found for this or any previous visit (from the past 240 hour(s)).         Studies: Dg Chest 2 View  12/10/2015  CLINICAL DATA:  Left-sided weakness in the arm and leg. Slurred speech with asymmetrical smile. Fall. Elevated white cell count. EXAM: CHEST  2 VIEW COMPARISON:  06/22/2015 FINDINGS: Atelectasis in the lung bases. Normal heart size and pulmonary vascularity. No focal airspace disease. No blunting of costophrenic angles. No pneumothorax. Tortuous aorta. Degenerative changes in the spine. IMPRESSION: Atelectasis in the lung bases. Electronically Signed   By: Burman NievesWilliam  Stevens M.D.   On: 12/10/2015 03:15   Ct Head Wo Contrast  12/10/2015  CLINICAL DATA:  Left-sided weakness EXAM: CT HEAD WITHOUT CONTRAST TECHNIQUE: Contiguous axial images were obtained from the base of the skull through the vertex without intravenous contrast. COMPARISON:  None. FINDINGS: Ventricles and sulci appear symmetrical. Mild ventricular dilatation consistent with mild central atrophy. No mass effect or midline shift. No abnormal extra-axial fluid collections. Gray-white matter junctions are distinct. Basal cisterns are not effaced. No evidence of acute intracranial hemorrhage. No depressed skull fractures. Retention cyst in the right maxillary antrum. No acute air-fluid levels in the paranasal sinuses.  Mastoid air cells are not opacified. IMPRESSION: No acute intracranial abnormalities.  Mild central atrophy. Electronically Signed   By: Burman NievesWilliam  Stevens M.D.   On: 12/10/2015 02:57        Scheduled Meds: . aspirin  300 mg Rectal Daily   Or  . aspirin  325 mg Oral Daily  . enoxaparin (LOVENOX) injection  40 mg Subcutaneous Q24H   Continuous Infusions: . sodium chloride 50 mL/hr at 12/10/15 0544    Principal Problem:   Stroke with cerebral ischemia [facility](HCC) Active Problems:   Essential hypertension   Leucocytosis   Stroke (cerebrum) (HCC)    Time spent: 45 minutes.    Marcellus ScottHONGALGI,ANAND, MD, FACP, FHM. [facility] Pager [phone]  If 7PM-7AM, please contact night-coverage [email or web address] Password [email or web address] 12/10/2015, 7:29 AM    LOS: 0 days

## 2015-12-10 NOTE — Progress Notes (Signed)
Paged Speech regarding evaluation. Awaiting for call back.   Sim BoastHavy, RN

## 2015-12-10 NOTE — ED Provider Notes (Signed)
CSN: [id number]     Arrival date & time 12/10/15  0113 History  By signing my name below, I, Bethel Born, attest that this documentation has been prepared under the direction and in the presence of Tomasita Crumble, MD. Electronically Signed: Bethel Born, ED Scribe. 12/10/2015. 3:16 AM.   Chief Complaint  Patient presents with  . Weakness    The history is provided by the patient. No language interpreter was used.   Brought in by EMS from home, George Frank is a 64 y.o. male with PMHx of HTN and sciatica who presents to the Emergency Department complaining of new left-sided weakness with onset yesterday at approximately 4 PM. Last night the pt slid to the floor while trying to get in bed and called EMS for help. Associated symptoms include slurred speech. Pt denies recent illness including fever, rhinorrhea, cough, vomiting, and diarrhea. He is on oxycodone for his sciatica. He denies history of stroke.   Past Medical History  Diagnosis Date  . Sciatica   . Hypertension   . Enlarged heart   . Phlebitis   . Sleep apnea    Past Surgical History  Procedure Laterality Date  . Knee cartilage surgery    . Femur fracture surgery     No family history on file. Social History  Substance Use Topics  . Smoking status: Former Smoker    Quit date: 10/27/1992  . Smokeless tobacco: Never Used  . Alcohol Use: Yes     Comment: Pt stated that he drinks scotch and wine on weekends    Review of Systems 10 Systems reviewed and all are negative for acute change except as noted in the HPI.  Allergies  Ivp dye  Home Medications   Prior to Admission medications   Medication Sig Start Date End Date Taking? Authorizing Provider  lisinopril (PRINIVIL,ZESTRIL) 40 MG tablet Take 40 mg by mouth daily.    Historical Provider, MD  lisinopril (PRINIVIL,ZESTRIL) 40 MG tablet Take 1 tablet (40 mg total) by mouth daily. 04/18/14   John Molpus, MD  oxyCODONE (ROXICODONE) 15 MG immediate release tablet  Take 15 mg by mouth every 4 (four) hours as needed for pain (Pt stated that he took oxycodone last evening at 1700 and again at midnight today for sciatica).    Historical Provider, MD   BP 150/82 mmHg  Pulse 103  Temp(Src) 99.2 F (37.3 C) (Oral)  Resp 19  Ht 5' 11.5" (1.816 m)  Wt 266 lb (120.657 kg)  BMI 36.59 kg/m2  SpO2 96% Physical Exam  Constitutional: He is oriented to person, place, and time. Vital signs are normal. He appears well-developed and well-nourished.  Non-toxic appearance. He does not appear ill. No distress.  HENT:  Head: Normocephalic and atraumatic.  Nose: Nose normal.  Mouth/Throat: Oropharynx is clear and moist. No oropharyngeal exudate.  Eyes: Conjunctivae and EOM are normal. Pupils are equal, round, and reactive to light. No scleral icterus.  Neck: Normal range of motion. Neck supple. No tracheal deviation, no edema, no erythema and normal range of motion present. No thyroid mass and no thyromegaly present.  Cardiovascular: Normal rate, regular rhythm, S1 normal, S2 normal, normal heart sounds, intact distal pulses and normal pulses.  Exam reveals no gallop and no friction rub.   No murmur heard. Pulmonary/Chest: Effort normal and breath sounds normal. No respiratory distress. He has no wheezes. He has no rhonchi. He has no rales.  Abdominal: Soft. Normal appearance and bowel sounds are normal. He exhibits no  distension, no ascites and no mass. There is no hepatosplenomegaly. There is no tenderness. There is no rebound, no guarding and no CVA tenderness.  Musculoskeletal: Normal range of motion. He exhibits no edema or tenderness.  Lymphadenopathy:    He has no cervical adenopathy.  Neurological: He is alert and oriented to person, place, and time. No cranial nerve deficit or sensory deficit.  Slurred speech  Left upper and lower extremity strength 4/5 Right upper and lower extremity strength  5/5   Skin: Skin is warm, dry and intact. No petechiae and no rash  noted. He is not diaphoretic. No erythema. No pallor.  Psychiatric: He has a normal mood and affect. His behavior is normal. Judgment normal.  Nursing note and vitals reviewed.   ED Course  Procedures (including critical care time) DIAGNOSTIC STUDIES: Oxygen Saturation is 96% on RA,  normal by my interpretation.    COORDINATION OF CARE: 1:53 AM Discussed treatment plan which includes lab work, CT head, and EKG with pt at bedside and pt agreed to plan.  2:04 AM-Consult complete with Dr. Roseanne Reno (Neurology). Patient case explained and discussed. Recommends to admit to hospitalist for further evaluation and treatment. Call ended at 2:05 AM  3:16 AM-Consult complete with Dr. Toniann Fail [facility]). Patient case explained and discussed. Agrees to admit patient for further evaluation and treatment. Call ended at 3:16 AM  Labs Review Labs Reviewed  CBC - Abnormal; Notable for the following:    WBC 23.9 (*)    All other components within normal limits  DIFFERENTIAL - Abnormal; Notable for the following:    Neutro Abs 21.5 (*)    Monocytes Absolute 1.6 (*)    All other components within normal limits  COMPREHENSIVE METABOLIC PANEL - Abnormal; Notable for the following:    Glucose, Bld 115 (*)    All other components within normal limits  CBG MONITORING, ED - Abnormal; Notable for the following:    Glucose-Capillary 105 (*)    All other components within normal limits  I-STAT CHEM 8, ED - Abnormal; Notable for the following:    Glucose, Bld 118 (*)    All other components within normal limits  PROTIME-INR  APTT  URINALYSIS, ROUTINE W REFLEX MICROSCOPIC (NOT AT [facility])  I-STAT TROPOININ, ED    Imaging Review Ct Head Wo Contrast  12/10/2015  CLINICAL DATA:  Left-sided weakness EXAM: CT HEAD WITHOUT CONTRAST TECHNIQUE: Contiguous axial images were obtained from the base of the skull through the vertex without intravenous contrast. COMPARISON:  None. FINDINGS: Ventricles and sulci  appear symmetrical. Mild ventricular dilatation consistent with mild central atrophy. No mass effect or midline shift. No abnormal extra-axial fluid collections. Gray-white matter junctions are distinct. Basal cisterns are not effaced. No evidence of acute intracranial hemorrhage. No depressed skull fractures. Retention cyst in the right maxillary antrum. No acute air-fluid levels in the paranasal sinuses. Mastoid air cells are not opacified. IMPRESSION: No acute intracranial abnormalities.  Mild central atrophy. Electronically Signed   By: Burman Nieves M.D.   On: 12/10/2015 02:57   I have personally reviewed and evaluated these images and lab results as part of my medical decision-making.   EKG Interpretation   Date/Time:  Friday December 10 2015 01:28:12 EST Ventricular Rate:  103 PR Interval:  168 QRS Duration: 107 QT Interval:  350 QTC Calculation: 458 R Axis:   45 Text Interpretation:  Sinus tachycardia Borderline low voltage, extremity  leads Abnormal R-wave progression, late transition PVCs have resolved  Confirmed by  Tomasita CrumbleOni, Deontay Ladnier [place] [phone]([id number]) on 12/10/2015 1:44:30 AM      MDM   Final diagnoses:  None   Patient presents emergency department for possible stroke symptoms. My physical exam shows continued neurologic abnormalities. I spoke with Dr. Roseanne RenoStewart with neurology who recommends the patient be admitted to the hospitalist for stroke workup.  CT scan of the head does not show any acute bleeding. Will page the hospitalist for admission.  I personally performed the services described in this documentation, which was scribed in my presence. The recorded information has been reviewed and is accurate.      Tomasita CrumbleAdeleke Quanasia Defino, MD 12/10/15 [phone]

## 2015-12-10 NOTE — Progress Notes (Signed)
Report to oncoming on RN to call MD if pt's decline per Dr. Waymon AmatoHongalgi request.   Sim BoastHavy, RN

## 2015-12-10 NOTE — Progress Notes (Signed)
STROKE TEAM PROGRESS NOTE   HISTORY George Frank is a 64 y.o. male history of hypertension, cardiac enlargement, sleep apnea and sciatica, presenting with new onset weakness involving left face arm and leg. In terms started at 4 PM on 12/09/2015. He has no previous history of stroke nor TIA. He has not been on antiplatelet therapy. CT scan of his head showed no acute intracranial abnormality. NIH stroke score was 4.  LSN: 1 PM on 12/09/2015 tPA Given: No: Beyond time window for treatment consideration mRankin:   SUBJECTIVE (INTERVAL HISTORY) No family members present. The patient complains of feeling short of breath. He has a previous tobacco history. There are some concerns that he may have aspirated. We will ask speech therapy to reevaluate the patient. The patient also reports a history of reflux. He requests medications for treatment of such.   OBJECTIVE Temp:  [97.9 F (36.6 C)-99.2 F (37.3 C)] 98.2 F (36.8 C) (12/16 0800) Pulse Rate:  [70-103] 70 (12/16 0800) Cardiac Rhythm:  [-] Normal sinus rhythm (12/16 0500) Resp:  [14-26] 20 (12/16 0800) BP: (126-164)/(65-116) 129/116 mmHg (12/16 0800) SpO2:  [91 %-96 %] 92 % (12/16 0800) Weight:  [120.657 kg (266 lb)] 120.657 kg (266 lb) (12/16 0126)  CBC:  Recent Labs Lab 12/10/15 0149 12/10/15 0155 12/10/15 0600  WBC 23.9*  --  28.5*  NEUTROABS 21.5*  --  24.8*  HGB 14.0 15.6 13.4  HCT 43.4 46.0 41.5  MCV 91.8  --  92.2  PLT 229  --  255    Basic Metabolic Panel:  Recent Labs Lab 12/10/15 0149 12/10/15 0155 12/10/15 0600  NA 138 141 139  K 4.3 4.3 4.3  CL 104 102 104  CO2 25  --  26  GLUCOSE 115* 118* 118*  BUN CREATININE 1.01 0.90 0.99  CALCIUM 9.4  --  9.2    Lipid Panel:    Component Value Date/Time   CHOL 123 12/10/2015 0600   TRIG 81 12/10/2015 0600   HDL 27* 12/10/2015 0600   CHOLHDL 4.6 12/10/2015 0600   VLDL 16 12/10/2015 0600   LDLCALC 80 12/10/2015 0600   HgbA1c: No results found  for: HGBA1C Urine Drug Screen: No results found for: LABOPIA, COCAINSCRNUR, LABBENZ, AMPHETMU, THCU, LABBARB    IMAGING  Dg Chest 2 View 12/10/2015   Atelectasis in the lung bases.   Ct Head Wo Contrast 12/10/2015   No acute intracranial abnormalities.  Mild central atrophy.   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 This abnormality is not visible on prior CT. Central atrophy with relatively mild small vessel disease. 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     PHYSICAL EXAM Middle-aged Caucasian male in mild respiratory distress. . Afebrile. Head is nontraumatic. Neck is supple without bruit.    Cardiac exam no murmur or gallop. Lungs are clear to auscultation. Distal pulses are well felt. Neurological Exam :  Awake alert oriented x 3 normal speech and language. Mild left lower face asymmetry. Tongue midline. No drift. Mild diminished fine finger movements on left. Orbits right over left upper extremity. Mild left grip weak.. Normal sensation . Normal coordination. ASSESSMENT/PLAN Mr. Odis Turck is a 64 y.o. male with history of hypertension, obstructive sleep apnea, remote tobacco history, and history of cocaine use  presenting with dysarthria and left hemiparesis. He did not receive IV t-PA due to late presentation.  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  Resultant  facial asymmetry  MRI  Acute RIGHT paramedian pontine infarct   MRA - Central atrophy with relatively mild small vessel disease.Dolichoectatic cerebral vasculature without proximal stenosis or dissection.  Carotid Doppler - pending  2D Echo - pending  LDL - 80  HgbA1c pending  VTE prophylaxis - Lovenox  Diet Heart Room service appropriate?: Yes; Fluid consistency:: Thin  No antithrombotic prior to admission, now on aspirin 325 mg daily  Patient counseled to be compliant with his  antithrombotic medications  Ongoing aggressive stroke risk factor management  Therapy recommendations:  Pending  Disposition: Pending  Hypertension  Blood pressure running somewhat high  Permissive hypertension (OK if < 220/120) but gradually normalize in 5-7 days  Hyperlipidemia  Home meds:  No lipid lowering medications prior to admission  LDL 80, goal < 70  Add Lipitor 20 mg daily  Continue statin at discharge  Diabetes  HgbA1c pending, goal < 7.0  Controlled  Other Stroke Risk Factors  Advanced age  Cigarette smoker, quit smoking 23 years ago  ETOH use  Obesity, Body mass index is 36.59 kg/(m^2).   Family hx stroke (father)  Obstructive sleep apnea, on CPAP at home  Other Active Problems  IVP dye allergy  Leukocytosis - afebrile - a #2 IV Unasyn  Rule out aspiration  Hospital day # 0  Delton SeeDavid Rinehuls PA-C [facility] Pager [phone] 12/10/2015, 12:34 PM I have personally examined this patient, reviewed notes, independently viewed imaging studies, participated in medical decision making and plan of care. I have made any additions or clarifications directly to the above note. Agree with note above. He presented with left-sided numbness and weakness secondary to right paramedian pontine infarct etiology likely small vessel disease. He remains at risk for neurological worsening, recurrent stroke, TIA needs ongoing stroke evaluation and aggressive risk factor modification. Start aspirin and Lipitor He has mild respiratory distress and may have possible aspiration pneumonia. Advise nothing by mouth until evaluated by speech pathologist. Continue oxygen. Discussed with Dr. Gust RungHongalgi  Pramod Sethi, MD Medical Director [facility] Pager: [phone] 12/10/2015 3:43 PM     To contact Stroke Continuity provider, please refer to [email or web address]. After hours, contact General Neurology

## 2015-12-10 NOTE — Progress Notes (Signed)
Pt arrived to unit from ED via NT x 1. Pt alert and oriented upon arrival. No complaints of pain or discomfort. No signs or symptoms of acute distress. Pt oriented to unit, as well as unit procedures. Pt connected to telemetry and central monitoring notified. Pt now resting in bed at lowest position, bed alarm, call light in reach. Will continue to monitor. Delfino Lovettichie Mathan Darroch, RN, BSN 12/10/2015 5:03 AM

## 2015-12-10 NOTE — Consult Note (Signed)
Admission H&P    Chief Complaint: Onset left-sided weakness.  HPI: George Frank is an 64 y.o. male history of hypertension, cardiac enlargement, sleep apnea sciatica, presenting with new onset weakness involving left face arm and leg. In terms started at 4 PM on 12/09/2015. He has no previous history of stroke nor TIA. He has not been on antiplatelet therapy. CT scan of his head showed no acute intracranial abnormality. NIH stroke score was 4.  LSN: 1 PM on 12/09/2015 tPA Given: No: Beyond time window for treatment consideration mRankin:  Past Medical History  Diagnosis Date  . Sciatica   . Hypertension   . Enlarged heart   . Phlebitis   . Sleep apnea     Past Surgical History  Procedure Laterality Date  . Knee cartilage surgery    . Femur fracture surgery      Family history: Positive for stroke involving his father.  Social History:  reports that he quit smoking about 23 years ago. He has never used smokeless tobacco. He reports that he drinks alcohol. He reports that he uses illicit drugs (Cocaine).  Allergies:  Allergies  Allergen Reactions  . Ivp Dye [Iodinated Diagnostic Agents]     Medications: Patient's preadmission medications were reviewed by me.  ROS: History obtained from the patient  General ROS: negative for - chills, fatigue, fever, night sweats, weight gain or weight loss Psychological ROS: negative for - behavioral disorder, hallucinations, memory difficulties, mood swings or suicidal ideation Ophthalmic ROS: negative for - blurry vision, double vision, eye pain or loss of vision ENT ROS: negative for - epistaxis, nasal discharge, oral lesions, sore throat, tinnitus or vertigo Allergy and Immunology ROS: negative for - hives or itchy/watery eyes Hematological and Lymphatic ROS: negative for - bleeding problems, bruising or swollen lymph nodes Endocrine ROS: negative for - galactorrhea, hair pattern changes, polydipsia/polyuria or temperature  intolerance Respiratory ROS: negative for - cough, hemoptysis, shortness of breath or wheezing Cardiovascular ROS: negative for - chest pain, dyspnea on exertion, edema or irregular heartbeat Gastrointestinal ROS: negative for - abdominal pain, diarrhea, hematemesis, nausea/vomiting or stool incontinence Genito-Urinary ROS: negative for - dysuria, hematuria, incontinence or urinary frequency/urgency Musculoskeletal ROS: negative for - joint swelling or muscular weakness Neurological ROS: as noted in HPI Dermatological ROS: negative for rash and skin lesion changes  Physical Examination: Blood pressure 149/79, pulse 96, temperature 99.2 F (37.3 C), temperature source Oral, resp. rate 18, height 5' 11.5" (1.816 m), weight 120.657 kg (266 lb), SpO2 96 %.  HEENT-  Normocephalic, no lesions, without obvious abnormality.  Normal external eye and conjunctiva.  Normal TM's bilaterally.  Normal auditory canals and external ears. Normal external nose, mucus membranes and septum.  Normal pharynx. Neck supple with no masses, nodes, nodules or enlargement. Cardiovascular - regular rate and rhythm, S1, S2 normal, no murmur, click, rub or gallop Lungs - chest clear, no wheezing, rales, normal symmetric air entry Abdomen - soft, non-tender; bowel sounds normal; no masses,  no organomegaly Extremities - no joint deformities, effusion, or inflammation, bilateral distal lower extremity edema, left greater than right  Neurologic Examination: Mental Status: Alert, oriented, thought content appropriate.  Speech moderately dysarthric without evidence of aphasia. Able to follow commands without difficulty. Cranial Nerves: II-Visual fields were normal. III/IV/VI-Pupils were equal and reacted normally to light. Extraocular movements were full and conjugate.    V/VII-no facial numbness; mild left lower facial weakness. VIII-normal. X-moderate dysarthria; symmetrical palatal movement. XI: trapezius strength/neck  flexion strength normal bilaterally XII-midline  tongue extension with normal strength. Motor: 5/5 bilaterally with normal tone and bulk Sensory: Normal throughout. Deep Tendon Reflexes: 1+ and symmetric. Plantars: Mute bilaterally Cerebellar: Normal finger-to-nose testing slightly impaired with use of left upper extremity. Carotid auscultation: Normal  Results for orders placed or performed during the hospital encounter of 12/10/15 (from the past 48 hour(s))  CBG monitoring, ED     Status: Abnormal   Collection Time: 12/10/15  1:48 AM  Result Value Ref Range   Glucose-Capillary 105 (H) 65 - 99 mg/dL  Protime-INR     Status: None   Collection Time: 12/10/15  1:49 AM  Result Value Ref Range   Prothrombin Time 14.8 11.6 - 15.2 seconds   INR 1.14 0.00 - 1.49  APTT     Status: None   Collection Time: 12/10/15  1:49 AM  Result Value Ref Range   aPTT 30 24 - 37 seconds  CBC     Status: Abnormal   Collection Time: 12/10/15  1:49 AM  Result Value Ref Range   WBC 23.9 (H) 4.0 - 10.5 K/uL   RBC 4.73 4.22 - 5.81 MIL/uL   Hemoglobin 14.0 13.0 - 17.0 g/dL   HCT 43.4 39.0 - 52.0 %   MCV 91.8 78.0 - 100.0 fL   MCH 29.6 26.0 - 34.0 pg   MCHC 32.3 30.0 - 36.0 g/dL   RDW 14.6 11.5 - 15.5 %   Platelets 229 150 - 400 K/uL  Differential     Status: Abnormal   Collection Time: 12/10/15  1:49 AM  Result Value Ref Range   Neutrophils Relative % 90 %   Neutro Abs 21.5 (H) 1.7 - 7.7 K/uL   Lymphocytes Relative 3 %   Lymphs Abs 0.7 0.7 - 4.0 K/uL   Monocytes Relative 7 %   Monocytes Absolute 1.6 (H) 0.1 - 1.0 K/uL   Eosinophils Relative 0 %   Eosinophils Absolute 0.1 0.0 - 0.7 K/uL   Basophils Relative 0 %   Basophils Absolute 0.0 0.0 - 0.1 K/uL  Comprehensive metabolic panel     Status: Abnormal   Collection Time: 12/10/15  1:49 AM  Result Value Ref Range   Sodium 138 135 - 145 mmol/L   Potassium 4.3 3.5 - 5.1 mmol/L   Chloride 104 101 - 111 mmol/L   CO2 25 22 - 32 mmol/L   Glucose, Bld  115 (H) 65 - 99 mg/dL   BUN 13 6 - 20 mg/dL   Creatinine, Ser 1.01 0.61 - 1.24 mg/dL   Calcium 9.4 8.9 - 10.3 mg/dL   Total Protein 6.8 6.5 - 8.1 g/dL   Albumin 3.5 3.5 - 5.0 g/dL   AST 27 15 - 41 U/L   ALT 18 17 - 63 U/L   Alkaline Phosphatase 65 38 - 126 U/L   Total Bilirubin 0.9 0.3 - 1.2 mg/dL   GFR calc non Af Amer >60 >60 mL/min   GFR calc Af Amer >60 >60 mL/min    Comment: (NOTE) The eGFR has been calculated using the CKD EPI equation. This calculation has not been validated in all clinical situations. eGFR's persistently <60 mL/min signify possible Chronic Kidney Disease.    Anion gap 9 5 - 15  I-stat troponin, ED (not at [facility])     Status: None   Collection Time: 12/10/15  1:52 AM  Result Value Ref Range   Troponin i, poc 0.03 0.00 - 0.08 ng/mL   Comment 3  Comment: Due to the release kinetics of cTnI, a negative result within the first hours of the onset of symptoms does not rule out myocardial infarction with certainty. If myocardial infarction is still suspected, repeat the test at appropriate intervals.   I-Stat Chem 8, ED  (not at [facility], [facility])     Status: Abnormal   Collection Time: 12/10/15  1:55 AM  Result Value Ref Range   Sodium 141 135 - 145 mmol/L   Potassium 4.3 3.5 - 5.1 mmol/L   Chloride 102 101 - 111 mmol/L   BUN 16 6 - 20 mg/dL   Creatinine, Ser 0.90 0.61 - 1.24 mg/dL   Glucose, Bld 118 (H) 65 - 99 mg/dL   Calcium, Ion 1.22 1.13 - 1.30 mmol/L   TCO2 27 0 - 100 mmol/L   Hemoglobin 15.6 13.0 - 17.0 g/dL   HCT 46.0 39.0 - 52.0 %   Dg Chest 2 View  12/10/2015  CLINICAL DATA:  Left-sided weakness in the arm and leg. Slurred speech with asymmetrical smile. Fall. Elevated white cell count. EXAM: CHEST  2 VIEW COMPARISON:  06/22/2015 FINDINGS: Atelectasis in the lung bases. Normal heart size and pulmonary vascularity. No focal airspace disease. No blunting of costophrenic angles. No pneumothorax. Tortuous aorta. Degenerative changes in the  spine. IMPRESSION: Atelectasis in the lung bases. Electronically Signed   By: Lucienne Capers M.D.   On: 12/10/2015 03:15   Ct Head Wo Contrast  12/10/2015  CLINICAL DATA:  Left-sided weakness EXAM: CT HEAD WITHOUT CONTRAST TECHNIQUE: Contiguous axial images were obtained from the base of the skull through the vertex without intravenous contrast. COMPARISON:  None. FINDINGS: Ventricles and sulci appear symmetrical. Mild ventricular dilatation consistent with mild central atrophy. No mass effect or midline shift. No abnormal extra-axial fluid collections. Gray-white matter junctions are distinct. Basal cisterns are not effaced. No evidence of acute intracranial hemorrhage. No depressed skull fractures. Retention cyst in the right maxillary antrum. No acute air-fluid levels in the paranasal sinuses. Mastoid air cells are not opacified. IMPRESSION: No acute intracranial abnormalities.  Mild central atrophy. Electronically Signed   By: Lucienne Capers M.D.   On: 12/10/2015 02:57    Assessment: 64 y.o. male presenting with probable acute right subcortical ischemic cerebral infarction.  Stroke Risk Factors - family history and hypertension  Plan: 1. HgbA1c, fasting lipid panel 2. MRI, MRA  of the brain without contrast 3. PT consult, OT consult, Speech consult 4. Echocardiogram 5. Carotid dopplers 6. Prophylactic therapy-Antiplatelet med: Aspirin  7. Risk factor modification 8. Telemetry monitoring  C.R. Nicole Kindred, MD [facility] [phone]  12/10/2015, 3:27 AM

## 2015-12-10 NOTE — ED Notes (Signed)
Pt given urinal to attempt to give urine specimen

## 2015-12-10 NOTE — Progress Notes (Signed)
ANTIBIOTIC CONSULT NOTE - INITIAL  Pharmacy Consult for Unasyn Indication: aspiration pneumonia  Allergies  Allergen Reactions  . Ivp Dye [Iodinated Diagnostic Agents] Other (See Comments)    intolerance    Patient Measurements: Height: 5' 11.5" (181.6 cm) Weight: 266 lb (120.657 kg) IBW/kg (Calculated) : 76.45  Vital Signs: Temp: 98.2 F (36.8 C) (12/16 0800) Temp Source: Oral (12/16 0800) BP: 129/116 mmHg (12/16 0800) Pulse Rate: 70 (12/16 0800) Intake/Output from previous day:   Intake/Output from this shift:    Labs:  Recent Labs  12/10/15 0149 12/10/15 0155 12/10/15 0600  WBC 23.9*  --  28.5*  HGB 14.0 15.6 13.4  PLT 229  --  255  CREATININE 1.01 0.90 0.99   Estimated Creatinine Clearance: 99.1 mL/min (by C-G formula based on Cr of 0.99). No results for input(s): VANCOTROUGH, VANCOPEAK, VANCORANDOM, GENTTROUGH, GENTPEAK, GENTRANDOM, TOBRATROUGH, TOBRAPEAK, TOBRARND, AMIKACINPEAK, AMIKACINTROU, AMIKACIN in the last 72 hours.   Microbiology: No results found for this or any previous visit (from the past 720 hour(s)).  Medical History: Past Medical History  Diagnosis Date  . Sciatica   . Hypertension   . Enlarged heart   . Phlebitis   . Sleep apnea    Assessment:  64 y/o M with pmh of HTN and sciatica presents on 12/16 w/ new left-sided weakness. Pharmacy is consulted to start Unasyn for aspiration pneumonia. CrCl ~ 100 mL/min. Afeb, WBC 28.5.   Goal of Therapy:  Resolution of infection  Plan:  Unasyn 3g q8h F/u renal function, C&S, LOT  Sandi CarneNick Aedon Deason, PharmD Pharmacy Resident Pager: [phone] 12/10/2015,11:02 AM

## 2015-12-10 NOTE — ED Notes (Signed)
EMS reports that pt called for help b/c he could not get, he "slid down his mattress to the floor".  Upon arrival they noticed left sided weakness in the arm and leg, speech is slurred and smile is asymetrical.

## 2015-12-10 NOTE — Progress Notes (Signed)
Echocardiogram 2D Echocardiogram has been performed.  George Frank, George Frank 12/10/2015, 12:22 PM

## 2015-12-11 ENCOUNTER — Inpatient Hospital Stay (HOSPITAL_COMMUNITY): Payer: Medicare Other

## 2015-12-11 DIAGNOSIS — D72829 Elevated white blood cell count, unspecified: Secondary | ICD-10-CM

## 2015-12-11 DIAGNOSIS — J690 Pneumonitis due to inhalation of food and vomit: Secondary | ICD-10-CM

## 2015-12-11 DIAGNOSIS — I639 Cerebral infarction, unspecified: Secondary | ICD-10-CM

## 2015-12-11 DIAGNOSIS — J9601 Acute respiratory failure with hypoxia: Secondary | ICD-10-CM

## 2015-12-11 LAB — CBC
HCT: 37.7 % — ABNORMAL LOW (ref 39.0–52.0)
HEMOGLOBIN: 12.6 g/dL — AB (ref 13.0–17.0)
MCH: 30.4 pg (ref 26.0–34.0)
MCHC: 33.4 g/dL (ref 30.0–36.0)
MCV: 90.8 fL (ref 78.0–100.0)
Platelets: 241 10*3/uL (ref 150–400)
RBC: 4.15 MIL/uL — AB (ref 4.22–5.81)
RDW: 14.7 % (ref 11.5–15.5)
WBC: 18.8 10*3/uL — ABNORMAL HIGH (ref 4.0–10.5)

## 2015-12-11 LAB — HEMOGLOBIN A1C
HEMOGLOBIN A1C: 6.2 % — AB (ref 4.8–5.6)
Mean Plasma Glucose: 131 mg/dL
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
## 2015-12-11 NOTE — Evaluation (Signed)
Clinical/Bedside Swallow Evaluation Patient Details  Name: George Frank MRN: [id number] Date of Birth: 1951-07-08  Today's Date: 12/11/2015 Time: SLP Start Time (ACUTE ONLY): 0804 SLP Stop Time (ACUTE ONLY): 0826 SLP Time Calculation (min) (ACUTE ONLY): 22 min  Past Medical History:  Past Medical History  Diagnosis Date  . Sciatica   . Hypertension   . Enlarged heart   . Phlebitis   . Sleep apnea    Past Surgical History:  Past Surgical History  Procedure Laterality Date  . Knee cartilage surgery    . Femur fracture surgery     HPI:  Pt is a 64 year old male with history of HTN-not on medications, OSA-could not tolerate CPAP and hence not using, former heavy smoker, sciatica [facility] with left-sided weakness and slurred speech. 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 On 12/16, complained of dyspnea and chest x-ray suggestive of aspiration pneumonia with patchy infiltrate at right base.    Assessment / Plan / Recommendation Clinical Impression  Pt without overt signs or symptoms of reduced airway protection this date with any consistencies. Delayed throat clear exhibited x1 following thin liquids via straw sip, however vocal quality remained clear throughout BSE.  Oral motor exam unremarkable. Swallow initiation appeared timely. RN reports difficulty with pm meds last night with overt coughing exhibited. Note right base infiltrate on recent chest x ray. Recommend diet implementation of thin liquids and regular consistencies with aspiration precautions including no straws and medicines whole in puree. ST to follow warranted to ensure diet tolerance.      Aspiration Risk  Mild aspiration risk    Diet Recommendation     Medication Administration: Whole meds with puree    Other  Recommendations Oral Care Recommendations: Oral care BID   Follow up Recommendations       Frequency and Duration min 2x/week  1  week       Prognosis Prognosis for Safe Diet Advancement: Good      Swallow Study   General Date of Onset: 12/10/15 HPI: Pt is a 64 year old male with history of HTN-not on medications, OSA-could not tolerate CPAP and hence not using, former heavy smoker, sciatica [facility] with left-sided weakness and slurred speech. 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 On 12/16, complained of dyspnea and chest x-ray suggestive of aspiration pneumonia with patchy infiltrate at right base.  Type of Study: Bedside Swallow Evaluation Diet Prior to this Study: NPO Temperature Spikes Noted: Yes Respiratory Status: Nasal cannula History of Recent Intubation: No Behavior/Cognition: Alert;Cooperative;Pleasant mood Oral Cavity Assessment: Dry Oral Cavity - Dentition: Missing dentition;Adequate natural dentition Vision: Functional for self-feeding Self-Feeding Abilities: Able to feed self Patient Positioning: Upright in bed Baseline Vocal Quality: Normal Volitional Cough: Strong Volitional Swallow: Able to elicit    Oral/Motor/Sensory Function Overall Oral Motor/Sensory Function: Within functional limits   Ice Chips Ice chips: Within functional limits   Thin Liquid Thin Liquid: Impaired Presentation: Cup;Straw;Spoon Pharyngeal  Phase Impairments: Throat Clearing - Delayed    Nectar Thick Nectar Thick Liquid: Not tested   Honey Thick Honey Thick Liquid: Not tested   Puree Puree: Within functional limits   Solid Solid: Within functional limits      George Duoshelsea Sumney MA, CCC-SLP Acute Care Speech Language Pathologist    George DuosSumney, George Frank E 12/11/2015,8:35 AM

## 2015-12-11 NOTE — Progress Notes (Signed)
STROKE TEAM PROGRESS NOTE   HISTORY George Frank is a 64 y.o. male history of hypertension, cardiac enlargement, sleep apnea and sciatica, presenting with new onset weakness involving left face arm and leg. In terms started at 4 PM on 12/09/2015. He has no previous history of stroke nor TIA. He has not been on antiplatelet therapy. CT scan of his head showed no acute intracranial abnormality. NIH stroke score was 4.  LSN: 1 PM on 12/09/2015 tPA Given: No: Beyond time window for treatment consideration mRankin:   SUBJECTIVE (INTERVAL HISTORY) No family members present. The patient states he's feeling much better today. He believes he is getting stronger. He has been up walking in the halls with nursing assistance.   OBJECTIVE Temp:  [98.1 F (36.7 C)-99.3 F (37.4 C)] 98.4 F (36.9 C) (12/17 1005) Pulse Rate:  [74-98] 91 (12/17 1005) Cardiac Rhythm:  [-] Normal sinus rhythm (12/17 0701) Resp:  [18-20] 18 (12/17 1005) BP: (116-166)/(42-85) 149/63 mmHg (12/17 1005) SpO2:  [90 %-97 %] 94 % (12/17 1005) FiO2 (%):  [2 %] 2 % (12/17 0514)  CBC:  Recent Labs Lab 12/10/15 0149  12/10/15 0600 12/11/15 0503  WBC 23.9*  --  28.5* 18.8*  NEUTROABS 21.5*  --  24.8*  --   HGB 14.0  < > 13.4 12.6*  HCT 43.4  < > 41.5 37.7*  MCV 91.8  --  92.2 90.8  PLT 229  --  255 241  < > = values in this interval not displayed.  Basic Metabolic Panel:   Recent Labs Lab 12/10/15 0149 12/10/15 0155 12/10/15 0600  NA 138 141 139  K 4.3 4.3 4.3  CL 104 102 104  CO2 25  --  26  GLUCOSE 115* 118* 118*  BUN CREATININE 1.01 0.90 0.99  CALCIUM 9.4  --  9.2    Lipid Panel:     Component Value Date/Time   CHOL 123 12/10/2015 0600   TRIG 81 12/10/2015 0600   HDL 27* 12/10/2015 0600   CHOLHDL 4.6 12/10/2015 0600   VLDL 16 12/10/2015 0600   LDLCALC 80 12/10/2015 0600   HgbA1c:  Lab Results  Component Value Date   HGBA1C 6.2* 12/10/2015   Urine Drug Screen:     Component Value  Date/Time   LABOPIA POSITIVE* 12/10/2015 1110   COCAINSCRNUR POSITIVE* 12/10/2015 1110   LABBENZ POSITIVE* 12/10/2015 1110   AMPHETMU NONE DETECTED 12/10/2015 1110   THCU NONE DETECTED 12/10/2015 1110   LABBARB NONE DETECTED 12/10/2015 1110      IMAGING  Dg Chest 2 View 12/10/2015   Atelectasis in the lung bases.   Dg Chest portable 1 view 12/10/2015 Patchy infiltrate in the right base. Question early pneumonia versus aspiration. Lungs elsewhere are clear except for mild scarring in the left base which is stable. Stable cardiac prominence.  Ct Head Wo Contrast 12/10/2015   No acute intracranial abnormalities.  Mild central atrophy.   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 This abnormality is not visible on prior CT. Central atrophy with relatively mild small vessel disease. 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     PHYSICAL EXAM Middle-aged Caucasian male in mild respiratory distress. . Afebrile. Head is nontraumatic. Neck is supple without bruit.    Cardiac exam no murmur or gallop. Lungs are clear to auscultation. Distal pulses are well felt. Neurological Exam :  Awake alert oriented x 3 normal speech and language.  Mild left lower face asymmetry. Tongue midline. No drift. Mild diminished fine finger movements on left. Orbits right over left upper extremity. Mild left grip weak.. Normal sensation . Normal coordination.    ASSESSMENT/PLAN George Frank is a 64 y.o. male with history of hypertension, obstructive sleep apnea, remote tobacco history, and history of cocaine use  presenting with dysarthria and left hemiparesis. He did not receive IV t-PA due to late presentation.  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  Resultant  facial asymmetry  MRI  Acute RIGHT paramedian pontine infarct   MRA - Central atrophy with relatively mild small vessel  disease.Dolichoectatic cerebral vasculature without proximal stenosis or dissection.  Carotid Doppler - pending  2D Echo - EF 60-65%. No cardiac source of emboli identified.  LDL - 80  HgbA1c 6.2  VTE prophylaxis - Lovenox Diet regular Room service appropriate?: Yes; Fluid consistency:: Thin  No antithrombotic prior to admission, now on aspirin 325 mg daily  Patient counseled to be compliant with his antithrombotic medications  Ongoing aggressive stroke risk factor management  Therapy recommendations:  Pending  Disposition: Pending  Hypertension  Blood pressure running somewhat high  Permissive hypertension (OK if < 220/120) but gradually normalize in 5-7 days  Hyperlipidemia  Home meds:  No lipid lowering medications prior to admission  LDL 80, goal < 70  Add Lipitor 20 mg daily  Continue statin at discharge  Diabetes  HgbA1c 6.2, goal < 7.0  Controlled  Other Stroke Risk Factors  Advanced age  Cigarette smoker, quit smoking 23 years ago  ETOH use  Obesity, Body mass index is 36.59 kg/(m^2).   Family hx stroke (father)  Obstructive sleep apnea, on CPAP at home  Other Active Problems  IVP dye allergy  Aspiration pneumonia- a #2 IV Unasyn  The therapist reviewed the patient's swallowing again. He is at mild risk for aspiration. Recommendation is for thin  liquids and regular consistencies with aspiration precautions.  UDS - positive for opiates, benzodiazepines, and cocaine  Hospital day # 1  Delton SeeDavid Rinehuls PA-C [facility] Pager [phone] 12/11/2015, 1:08 PM  I have personally examined this patient, reviewed notes, independently viewed imaging studies, participated in medical decision making and plan of care. I have made any additions or clarifications directly to the above note. Agree with note above.   PT/OT D/c planning   Pauletta BrownsZEYLIKMAN, Helma Argyle    To contact Stroke Continuity provider, please refer to [email or web address]. After  hours, contact General Neurology

## 2015-12-11 NOTE — Progress Notes (Signed)
PROGRESS NOTE    George Frank WUJ:[id number] DOB: [birth year] DOA: 12/10/2015 PCP: Emilio Aspen, MD  HPI/Brief narrative 64 year old male with history of HTN-not on medications, OSA-could not tolerate CPAP and hence not using, former heavy smoker, sciatica presented to [facility] ED with left-sided weakness and slurred speech. CT head negative but MRI confirmed acute stroke. Neurology following. On 12/16, complained of dyspnea and chest x-ray suggestive of aspiration pneumonia.   Assessment/Plan:  Stroke: Non-dominant infarct secondary to small vessel disease. Resultant facial asymmetry MRI Acute RIGHT paramedian pontine infarct MRA - Central atrophy with relatively mild small vessel disease.Dolichoectatic cerebral vasculature without proximal stenosis or dissection. Carotid Doppler - pending 2D Echo - LVEF 60 - 65% LDL - 80  HgbA1c: 6.2 No antithrombotic prior to admission, now on aspirin 325 mg daily Therapy recommendations: Pending Diet: Pending speech therapy evaluation.  Right lower lobe aspiration pneumonia - On 12/16, Patient complained of nonproductive cough, dyspnea, has confirmed stroke and possible dysphagia related to that, low-grade fever and marked leukocytosis with bandemia - Speech therapy consulted for swallow evaluation - IV Unasyn per pharmacy - We will need follow-up chest x-ray in 3-4 weeks to ensure resolution of pneumonia findings. -  Improved. Continue additional day of IV antibiotics and switch to oral Augmentin on 12/ 18  Acute respiratory failure with hypoxia - Secondary to aspiration pneumonia complicating underlying OSA and possible COPD - Treat pneumonia. Oxygen, bronchodilator nebulizations -  Improved. Wean off of oxygen as long as oxygen saturations >92%.  Essential hypertension - Controlled. Allow for permissive hypertension given recent stroke.  Hyperlipidemia - LDL 80, goal <70. Started Lipitor 20 MG daily  Former smoker - States  that he smoked 2-3 packs per day for 40 years and quit when his wife was pregnant with their first child. Denies alcohol abuse.  Cocaine abuse -  Cessation counseled  Obesity/Body mass index is 36.59 kg/(m^2).   OSA, not on CPAP   DVT prophylaxis: Lovenox Code Status: Full Family Communication: None at bedside Disposition Plan:  DC home possibly 12/ 18   Consultants:  Neurology  Procedures:   2-D echo 12/10/15: Study Conclusions  - Left ventricle: The cavity size was normal. Wall thickness was normal. Systolic function was normal. The estimated ejection fraction was in the range of 60% to 65%. Doppler parameters are consistent with abnormal left ventricular relaxation (grade 1 diastolic dysfunction). - Left atrium: The atrium was mildly dilated. - Right ventricle: The cavity size was mildly dilated. - Right atrium: The atrium was mildly dilated.  Antibiotics:  IV Unasyn 12/16 >   Subjective:  feels much better. No dyspnea. Cough improved. Chronic right hip /Sciatica pain.  No focal weakness or numbness.  Objective: Filed Vitals:   12/10/15 2037 12/10/15 2133 12/11/15 0135 12/11/15 0514  BP:  144/68 116/42 166/85  Pulse:  98 84 85  Temp:  99.3 F (37.4 C) 98.2 F (36.8 C) 98.5 F (36.9 C)  TempSrc:  Oral Oral Oral  Resp:  Height:      Weight:      SpO2: 96% 90% 95% 94%    Intake/Output Summary (Last 24 hours) at 12/11/15 0726 Last data filed at 12/10/15 1800  Gross per 24 hour  Intake    340 ml  Output    625 ml  Net   -285 ml   Filed Weights   12/10/15 0126  Weight: 120.657 kg (266 lb)     Exam:  General exam: Moderately built  and morbidly obese male patient sitting in chair this morning. Looks much improved compared to yesterday. Respiratory system:  Much improved breath sounds. Occasional right basal crackles otherwise clear to auscultation. No increased work of breathing. Cardiovascular system: S1 & S2 heard, RRR. No JVD,  murmurs, gallops, clicks or pedal edema. Telemetry: Sinus rhythm. Gastrointestinal system: Abdomen is nondistended, soft and nontender. Normal bowel sounds heard. Central nervous system: Alert and oriented 3. Dysarthria -resolved. Facial asymmetry improved/resolved. No focal neurological deficits. Extremities: Symmetric 5 x 5 power.   Data Reviewed: Basic Metabolic Panel:  Recent Labs Lab 12/10/15 0149 12/10/15 0155 12/10/15 0600  NA 138 141 139  K 4.3 4.3 4.3  CL 104 102 104  CO2 25  --  26  GLUCOSE 115* 118* 118*  BUN CREATININE 1.01 0.90 0.99  CALCIUM 9.4  --  9.2   Liver Function Tests:  Recent Labs Lab 12/10/15 0149 12/10/15 0600  AST 27 23  ALT 18 19  ALKPHOS 65 66  BILITOT 0.9 0.8  PROT 6.8 6.5  ALBUMIN 3.5 3.1*   No results for input(s): LIPASE, AMYLASE in the last 168 hours. No results for input(s): AMMONIA in the last 168 hours. CBC:  Recent Labs Lab 12/10/15 0149 12/10/15 0155 12/10/15 0600 12/11/15 0503  WBC 23.9*  --  28.5* 18.8*  NEUTROABS 21.5*  --  24.8*  --   HGB 14.0 15.6 13.4 12.6*  HCT 43.4 46.0 41.5 37.7*  MCV 91.8  --  92.2 90.8  PLT 229  --  255 241   Cardiac Enzymes:  Recent Labs Lab 12/10/15 0600  CKTOTAL 57   BNP (last 3 results) No results for input(s): PROBNP in the last 8760 hours. CBG:  Recent Labs Lab 12/10/15 0148  GLUCAP 105*    No results found for this or any previous visit (from the past 240 hour(s)).         Studies: Dg Chest 2 View  12/10/2015  CLINICAL DATA:  Left-sided weakness in the arm and leg. Slurred speech with asymmetrical smile. Fall. Elevated white cell count. EXAM: CHEST  2 VIEW COMPARISON:  06/22/2015 FINDINGS: Atelectasis in the lung bases. Normal heart size and pulmonary vascularity. No focal airspace disease. No blunting of costophrenic angles. No pneumothorax. Tortuous aorta. Degenerative changes in the spine. IMPRESSION: Atelectasis in the lung bases. Electronically  Signed   By: Burman Nieves M.D.   On: 12/10/2015 03:15   Dg Pelvis 1-2 Views  12/10/2015  CLINICAL DATA:  Fall yesterday.  Low back and pelvic pain. EXAM: PELVIS - 1-2 VIEW COMPARISON:  None. FINDINGS: Mild degenerative changes in the hips bilaterally and visualized lower lumbar spine. SI joints are symmetric and unremarkable. No acute bony abnormality. Specifically, no fracture, subluxation, or dislocation. Soft tissues are intact. IMPRESSION: No acute bony abnormality. Electronically Signed   By: Charlett Nose M.D.   On: 12/10/2015 09:29   Ct Head Wo Contrast  12/10/2015  CLINICAL DATA:  Left-sided weakness EXAM: CT HEAD WITHOUT CONTRAST TECHNIQUE: Contiguous axial images were obtained from the base of the skull through the vertex without intravenous contrast. COMPARISON:  None. FINDINGS: Ventricles and sulci appear symmetrical. Mild ventricular dilatation consistent with mild central atrophy. No mass effect or midline shift. No abnormal extra-axial fluid collections. Gray-white matter junctions are distinct. Basal cisterns are not effaced. No evidence of acute intracranial hemorrhage. No depressed skull fractures. Retention cyst in the right maxillary antrum. No acute air-fluid levels in the paranasal sinuses.  Mastoid air cells are not opacified. IMPRESSION: No acute intracranial abnormalities.  Mild central atrophy. Electronically Signed   By: Burman Nieves M.D.   On: 12/10/2015 02:57   Mr Brain Wo Contrast  12/10/2015  CLINICAL DATA:  Acute onset of symptoms that started 12/09/2015 around 4 p.m. LEFT-sided weakness. Slurred speech. Stroke risk factors include hypertension. EXAM: MRI HEAD WITHOUT CONTRAST MRA HEAD WITHOUT CONTRAST TECHNIQUE: Multiplanar, multiecho pulse sequences of the brain and surrounding structures were obtained without intravenous contrast. Angiographic images of the head were obtained using MRA technique without contrast. COMPARISON:  None. FINDINGS: MRI HEAD FINDINGS The  patient had difficulty holding still and the exam was prematurely truncated. Overall the exam is diagnostic for ischemia. There is acute infarction affecting the RIGHT paramedian pons. This extends over an anterior-posterior length of approximately 15 mm, but is only 5 mm thick maximally, near its base. Within limits for assessment due to the truncated exam, there is no associated hemorrhage, mass lesion, or extra-axial fluid. Hydrocephalus ex vacuo representing central atrophy. Minor T2 and FLAIR hyperintensities throughout the white matter, likely chronic microvascular ischemic change. Negative orbits. No acute sinus or mastoid disease. No midline abnormality is evident. MRA HEAD FINDINGS There are dolichoectatic but widely patent BILATERAL internal carotid arteries. Both anterior cerebral arteries are robust. Proximal M1 MCA vessels widely patent. Artifactual signal loss at the BILATERAL MCA trifurcations, as well as the BILATERAL ambient segments PCA, as seen on axial source images from series 5. Basilar artery shows no irregularity, stenosis, or dissection. Both vertebrals are contributory, LEFT dominant. No cerebellar branch occlusion. Proximal PCA vessels widely patent. No visible intracranial aneurysm. 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 This abnormality is not visible on prior CT. Central atrophy with relatively mild small vessel disease. 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 Electronically Signed   By: Elsie Stain M.D.   On: 12/10/2015 09:25   Dg Chest Port 1 View  12/10/2015  CLINICAL DATA:  Shortness of breath with productive cough EXAM: PORTABLE CHEST 1 VIEW COMPARISON:  December 10, 2015 study obtained earlier in the day and October 27, 2014 FINDINGS: There is patchy airspace opacity in the right base. There is mild scarring in the left base. Lungs elsewhere clear. Heart is borderline prominent with pulmonary  vascularity within normal limits. No adenopathy. There is degenerative change in the thoracic spine. IMPRESSION: Patchy infiltrate in the right base. Question early pneumonia versus aspiration. Lungs elsewhere are clear except for mild scarring in the left base which is stable. Stable cardiac prominence. Electronically Signed   By: Bretta Bang III M.D.   On: 12/10/2015 10:37   Mr Maxine Glenn Head/brain Wo Cm  12/10/2015  CLINICAL DATA:  Acute onset of symptoms that started 12/09/2015 around 4 p.m. LEFT-sided weakness. Slurred speech. Stroke risk factors include hypertension. EXAM: MRI HEAD WITHOUT CONTRAST MRA HEAD WITHOUT CONTRAST TECHNIQUE: Multiplanar, multiecho pulse sequences of the brain and surrounding structures were obtained without intravenous contrast. Angiographic images of the head were obtained using MRA technique without contrast. COMPARISON:  None. FINDINGS: MRI HEAD FINDINGS The patient had difficulty holding still and the exam was prematurely truncated. Overall the exam is diagnostic for ischemia. There is acute infarction affecting the RIGHT paramedian pons. This extends over an anterior-posterior length of approximately 15 mm, but is only 5 mm thick maximally, near its base. Within limits for assessment due to the truncated exam, there is no associated hemorrhage, mass lesion, or extra-axial fluid. Hydrocephalus ex  vacuo representing central atrophy. Minor T2 and FLAIR hyperintensities throughout the white matter, likely chronic microvascular ischemic change. Negative orbits. No acute sinus or mastoid disease. No midline abnormality is evident. MRA HEAD FINDINGS There are dolichoectatic but widely patent BILATERAL internal carotid arteries. Both anterior cerebral arteries are robust. Proximal M1 MCA vessels widely patent. Artifactual signal loss at the BILATERAL MCA trifurcations, as well as the BILATERAL ambient segments PCA, as seen on axial source images from series 5. Basilar artery shows  no irregularity, stenosis, or dissection. Both vertebrals are contributory, LEFT dominant. No cerebellar branch occlusion. Proximal PCA vessels widely patent. No visible intracranial aneurysm. 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 This abnormality is not visible on prior CT. Central atrophy with relatively mild small vessel disease. 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 Electronically Signed   By: Elsie StainJohn T Curnes M.D.   On: 12/10/2015 09:25        Scheduled Meds: . ampicillin-sulbactam (UNASYN) IV  3 g Intravenous Q8H  . aspirin  300 mg Rectal Daily   Or  . aspirin  325 mg Oral Daily  . atorvastatin  20 mg Oral q1800  . enoxaparin (LOVENOX) injection  40 mg Subcutaneous Q24H  . ipratropium-albuterol  3 mL Nebulization Q6H  . pantoprazole  40 mg Oral Daily   Continuous Infusions:    Principal Problem:   Stroke with cerebral ischemia (HCC) Active Problems:   Essential hypertension   Leucocytosis   Stroke (cerebrum) (HCC)    Time spent: 25 minutes.    Marcellus ScottHONGALGI,Priscella Donna, MD, FACP, FHM. [facility] Pager [phone]  If 7PM-7AM, please contact night-coverage [email or web address] Password [email or web address] 12/11/2015, 7:26 AM    LOS: 1 day

## 2015-12-12 DIAGNOSIS — J9601 Acute respiratory failure with hypoxia: Secondary | ICD-10-CM | POA: Diagnosis not present

## 2015-12-12 DIAGNOSIS — G4733 Obstructive sleep apnea (adult) (pediatric): Secondary | ICD-10-CM | POA: Diagnosis present

## 2015-12-12 DIAGNOSIS — J690 Pneumonitis due to inhalation of food and vomit: Secondary | ICD-10-CM | POA: Diagnosis not present

## 2015-12-12 LAB — CBC
HCT: 39.6 % (ref 39.0–52.0)
Hemoglobin: 12.8 g/dL — ABNORMAL LOW (ref 13.0–17.0)
MCH: 29.5 pg (ref 26.0–34.0)
MCHC: 32.3 g/dL (ref 30.0–36.0)
MCV: 91.2 fL (ref 78.0–100.0)
PLATELETS: 228 10*3/uL (ref 150–400)
RBC: 4.34 MIL/uL (ref 4.22–5.81)
RDW: 14.5 % (ref 11.5–15.5)
WBC: 13.4 10*3/uL — AB (ref 4.0–10.5)

## 2015-12-12 LAB — PROCALCITONIN: Procalcitonin: 0.27 ng/mL

## 2015-12-12 MED ORDER — PANTOPRAZOLE SODIUM 40 MG PO TBEC: 40.0000 mg | DELAYED_RELEASE_TABLET | Freq: Every day | ORAL | Status: DC

## 2015-12-12 MED ORDER — AMOXICILLIN-POT CLAVULANATE 875-125 MG PO TABS
1.0000 | ORAL_TABLET | Freq: Two times a day (BID) | ORAL | Status: DC
  Administered 2015-12-12: 1 via ORAL
  Filled 2015-12-12: qty 1

## 2015-12-12 MED ORDER — ATORVASTATIN CALCIUM 20 MG PO TABS: 20.0000 mg | ORAL_TABLET | Freq: Every day | ORAL | Status: DC

## 2015-12-12 MED ORDER — AMOXICILLIN-POT CLAVULANATE 875-125 MG PO TABS: 1.0000 | ORAL_TABLET | Freq: Two times a day (BID) | ORAL | Status: DC

## 2015-12-12 MED ORDER — ASPIRIN EC 325 MG PO TBEC: 325.0000 mg | DELAYED_RELEASE_TABLET | Freq: Every day | ORAL | Status: DC

## 2015-12-12 NOTE — Progress Notes (Signed)
Speech Language Pathology Treatment: Dysphagia  Patient Details Name: George Frank MRN: [id number] DOB: 1951-06-13 Today's Date: 12/12/2015 Time: 0755-0809 SLP Time Calculation (min) (ACUTE ONLY): 14 min  Assessment / Plan / Recommendation Clinical Impression  Pt continues to tolerate diet with no signs of aspiration. Pt observed with am meal. Taking small single cup sips independently and able to verbalize strategy from prior SLP. When asked to take straw sips for diagnostic purpose, pt still tolerated well with no signs of aspiration. He reports articulation and oral weakness has resolved and only very slight asymmetry visible to SLP. Will defer cognitive linguistic eval as pt appears [facility] He may continue diet with basic precautions. No SLP f/u needed at this time.    HPI HPI: Pt is a 64 year old male with history of HTN-not on medications, OSA-could not tolerate CPAP and hence not using, former heavy smoker, sciatica presented to [facility] ED with left-sided weakness and slurred speech. 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 On 12/16, complained of dyspnea and chest x-ray suggestive of aspiration pneumonia with patchy infiltrate at right base.       SLP Plan  Discharge SLP treatment due to (comment);All goals met     Recommendations  Diet recommendations: Regular;Thin liquid Liquids provided via: Cup Medication Administration: Whole meds with puree Supervision: Patient able to self feed Compensations: Slow rate;Small sips/bites;Lingual sweep for clearance of pocketing Postural Changes and/or Swallow Maneuvers: Seated upright 90 degrees              Oral Care Recommendations: Oral care BID Follow up Recommendations: None Plan: Discharge SLP treatment due to (comment);All goals met  Herbie [place], [place] CCC-SLP [phone]  Lynann Beaver 12/12/2015, 8:10 AM

## 2015-12-12 NOTE — Progress Notes (Signed)
STROKE TEAM PROGRESS NOTE   HISTORY George Frank is a 64 y.o. male history of hypertension, cardiac enlargement, sleep apnea and sciatica, presenting with new onset weakness involving left face arm and leg. In terms started at 4 PM on 12/09/2015. He has no previous history of stroke nor TIA. He has not been on antiplatelet therapy. CT scan of his head showed no acute intracranial abnormality. NIH stroke score was 4.  LSN: 1 PM on 12/09/2015 tPA Given: No: Beyond time window for treatment consideration mRankin:   SUBJECTIVE (INTERVAL HISTORY) No family members present. The patient states he feels fine. He is anxious for discharge.   OBJECTIVE Temp:  [98.3 F (36.8 C)-99 F (37.2 C)] 98.6 F (37 C) (12/18 0535) Pulse Rate:  [58-91] 68 (12/18 0535) Cardiac Rhythm:  [-] Normal sinus rhythm (12/17 1900) Resp:  [16-18] 18 (12/18 0535) BP: (119-149)/(59-83) 130/67 mmHg (12/18 0535) SpO2:  [90 %-98 %] 91 % (12/18 0844)  CBC:  Recent Labs Lab 12/10/15 0149  12/10/15 0600 12/11/15 0503 12/12/15 0500  WBC 23.9*  --  28.5* 18.8* 13.4*  NEUTROABS 21.5*  --  24.8*  --   --   HGB 14.0  < > 13.4 12.6* 12.8*  HCT 43.4  < > 41.5 37.7* 39.6  MCV 91.8  --  92.2 90.8 91.2  PLT 229  --  255 241 228  < > = values in this interval not displayed.  Basic Metabolic Panel:   Recent Labs Lab 12/10/15 0149 12/10/15 0155 12/10/15 0600  NA 138 141 139  K 4.3 4.3 4.3  CL 104 102 104  CO2 25  --  26  GLUCOSE 115* 118* 118*  BUN CREATININE 1.01 0.90 0.99  CALCIUM 9.4  --  9.2    Lipid Panel:     Component Value Date/Time   CHOL 123 12/10/2015 0600   TRIG 81 12/10/2015 0600   HDL 27* 12/10/2015 0600   CHOLHDL 4.6 12/10/2015 0600   VLDL 16 12/10/2015 0600   LDLCALC 80 12/10/2015 0600   HgbA1c:  Lab Results  Component Value Date   HGBA1C 6.2* 12/10/2015   Urine Drug Screen:     Component Value Date/Time   LABOPIA POSITIVE* 12/10/2015 1110   COCAINSCRNUR POSITIVE*  12/10/2015 1110   LABBENZ POSITIVE* 12/10/2015 1110   AMPHETMU NONE DETECTED 12/10/2015 1110   THCU NONE DETECTED 12/10/2015 1110   LABBARB NONE DETECTED 12/10/2015 1110      IMAGING  Dg Chest 2 View 12/10/2015   Atelectasis in the lung bases.   Dg Chest portable 1 view 12/10/2015 Patchy infiltrate in the right base. Question early pneumonia versus aspiration. Lungs elsewhere are clear except for mild scarring in the left base which is stable. Stable cardiac prominence.  Ct Head Wo Contrast 12/10/2015   No acute intracranial abnormalities.  Mild central atrophy.   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 This abnormality is not visible on prior CT. Central atrophy with relatively mild small vessel disease. 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     PHYSICAL EXAM Middle-aged Caucasian male in mild respiratory distress. . Afebrile. Head is nontraumatic. Neck is supple without bruit.    Cardiac exam no murmur or gallop. Lungs are clear to auscultation. Distal pulses are well felt. Neurological Exam :  Awake alert oriented x 3 normal speech and language. Mild left lower face asymmetry. Tongue midline. No drift. Mild diminished fine finger  movements on left. Orbits right over left upper extremity. Mild left grip weak.. Normal sensation . Normal coordination.    ASSESSMENT/PLAN George Frank is a 64 y.o. male with history of hypertension, obstructive sleep apnea, remote tobacco history, and history of cocaine use  presenting with dysarthria and left hemiparesis. He did not receive IV t-PA due to late presentation.  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  Resultant  facial asymmetry  MRI  Acute RIGHT paramedian pontine infarct   MRA - Central atrophy with relatively mild small vessel disease.Dolichoectatic cerebral vasculature without proximal stenosis or  dissection.  Carotid Doppler - Bilateral: No significant (1-39%) ICA stenosis. Antegrade vertebral flow.   2D Echo - EF 60-65%. No cardiac source of emboli identified.  LDL - 80  HgbA1c 6.2  VTE prophylaxis - Lovenox Diet regular Room service appropriate?: Yes; Fluid consistency:: Thin  No antithrombotic prior to admission, now on aspirin 325 mg daily  Patient counseled to be compliant with his antithrombotic medications  Ongoing aggressive stroke risk factor management  Therapy recommendations:  No follow-up physical therapy recommended.  Disposition: Discharge today  Hypertension  Blood pressure running somewhat high  Permissive hypertension (OK if < 220/120) but gradually normalize in 5-7 days  Hyperlipidemia  Home meds:  No lipid lowering medications prior to admission  LDL 80, goal < 70  Add Lipitor 20 mg daily  Continue statin at discharge  Diabetes  HgbA1c 6.2, goal < 7.0  Controlled  Other Stroke Risk Factors  Advanced age  Cigarette smoker, quit smoking 23 years ago  ETOH use  Obesity, Body mass index is 36.59 kg/(m^2).   Family hx stroke (father)  Obstructive sleep apnea, on CPAP at home  Other Active Problems  IVP dye allergy  Aspiration pneumonia- treated  The therapist reviewed the patient's swallowing again. He is at mild risk for aspiration. Recommendation is for thin  liquids and regular consistencies with aspiration precautions.  UDS - positive for opiates, benzodiazepines, and cocaine  Follow-up with Dr. Pearlean BrownieSethi in 2 months  Hospital day # 2  Delton SeeDavid Dolan Xia PA-C [facility] Pager [phone] 12/12/2015, 9:12 AM  I have personally examined this patient, reviewed notes, independently viewed imaging studies, participated in medical decision making and plan of care. I have made any additions or clarifications directly to the above note. Agree with note above.      To contact Stroke Continuity provider,  please refer to [email or web address]. After hours, contact General Neurology
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
## 2015-12-12 NOTE — Discharge Summary (Addendum)
Physician DischarPPI started. PPIge Summary  George Frank George Frank:[id number] DOB: 08-09-1951 DOA: 12/10/2015  PCP: Dennard Schaumann, MD  Admit date: 12/10/2015 Discharge date: 12/12/2015  Time spent: Greater than 30 minutes  Recommendations for Outpatient Follow-up:  1. Dr. Dennard Schaumann, PCP in 5 days. Please follow final blood culture results that were sent from the hospital. 2. Recommend follow-up chest x-ray in 3-4 weeks to insure resolution of pneumonia findings. 3. Recommend outpatient follow-up with PCP regarding evaluation for sleep apnea with repeat sleep study. 4. Dr. Delia Heady, Neurology in 2 months. 5. DME Cane  Discharge Diagnoses:  Principal Problem:   Stroke with cerebral ischemia [facility]) Active Problems:   Essential hypertension   Leucocytosis   Stroke (cerebrum) (HCC)   Discharge Condition: Improved & Stable  Diet recommendation: Heart healthy diet.   Filed Weights   12/10/15 0126  Weight: 120.657 kg (266 lb)    History of present illness:  64 year old male with history of HTN-not on medications, OSA-could not tolerate CPAP and hence not using, former heavy smoker, cocaine abuse, sciatica on opioids prescribed by PCP, presented to [facility] ED with left-sided weakness and slurred speech. CT head negative but MRI confirmed acute stroke. Neurology following. On 12/16, complained of dyspnea and chest x-ray suggestive of aspiration pneumonia  Hospital Course:   Stroke: Non-dominant infarct secondary to small vessel disease. Resultantfacial asymmetry-resolved.  MRI Acute RIGHT paramedian pontine infarct MRA - Central atrophy with relatively mild small vessel disease.Dolichoectatic cerebral vasculature without proximal stenosis or dissection. Carotid Doppler - no significant ICA stenosis. 2D Echo - LVEF 60 - 65% LDL - 80  HgbA1c: 6.2 No antithrombotic prior to admission, now on aspirin 325 mg daily Therapy recommendations: PT recommends no PT follow up. ST  recommends regular diet & thin liquids.  Right lower lobe aspiration pneumonia - On 12/16, Patient complained of nonproductive cough, dyspnea, has confirmed stroke and possible dysphagia related to that, low-grade fever and marked leukocytosis with bandemia - Speech therapy consulted for swallow evaluation > recommended regular diet and thin liquids.  - Blood cultures 2: Negative to date. - Treated for 2 days with IV Unasyn. Clinically improved. No fevers. Leukocytosis almost resolved. We'll transition to oral Augmentin to complete total 7 days treatment. - Recommend repeating chest x-ray in 3-4 weeks to ensure resolution of pneumonia findings.  Acute respiratory failure with hypoxia - Secondary to aspiration pneumonia complicating underlying OSA and possible COPD - Treat pneumonia. Oxygen, bronchodilator nebulizations - Improved.  - Reassessed oxygen requirement this morning and desaturates on room air with activity. Case management consultation for home oxygen which she may need briefly and can be evaluated as outpatient and weaned off as deemed necessary. - Also recommended outpatient follow-up for repeat sleep study.  Essential hypertension - Controlled off medications.  Hyperlipidemia - LDL 80, goal <70. Started Lipitor 20 MG daily  Former smoker - States that he smoked 2-3 packs per day for 40 years and quit when his wife was pregnant with their first child. Denies alcohol abuse.  Cocaine abuse - Cessation counseled  Obesity/Body mass index is 36.59 kg/(m^2).   OSA, not on CPAP - Recommend outpatient repeat sleep study and evaluation for CPAP.  Presumed GERD - PPI started.   Sciatica OP follow up.   Consultants:  Neurology  Procedures:  2-D echo 12/10/15: Study Conclusions  - Left ventricle: The cavity size was normal. Wall thickness was normal. Systolic function was normal. The estimated ejection fraction was in the range of 60% to 65%. Doppler  parameters are consistent with abnormal left ventricular relaxation (grade 1 diastolic dysfunction). - Left atrium: The atrium was mildly dilated. - Right ventricle: The cavity size was mildly dilated. - Right atrium: The atrium was mildly dilated.  Vascular Ultrasound Carotid Duplex (Doppler) has been completed. Preliminary findings: Bilateral: No significant (1-39%) ICA stenosis. Antegrade vertebral flow.    Discharge Exam:  Complaints:  continues to improve. Denies slurred speech or asymmetric limb weakness or numbness. Dyspnea resolved. Occasional dry cough. No chest pain reported. Anxious to go home.   Filed Vitals:   12/12/15 0103 12/12/15 0535 12/12/15 0844 12/12/15 1021  BP: 119/69 130/67  134/67  Pulse: 58 68  71  Temp: 98.8 F (37.1 C) 98.6 F (37 C)    TempSrc: Oral Oral    Resp: 18 18  17   Height:      Weight:      SpO2: 90% 91% 91% 94%    General exam: Moderately built and morbidly obese male patient sitting up comfortably in chair this morning. Respiratory system: Occasional right basal crackles otherwise clear to auscultation. No increased work of breathing. Cardiovascular system: S1 & S2 heard, RRR. No JVD, murmurs, gallops, clicks or pedal edema. Telemetry: Sinus rhythm. Gastrointestinal system: Abdomen is nondistended, soft and nontender. Normal bowel sounds heard. Central nervous system: Alert and oriented 3. Dysarthria -resolved. Facial asymmetry improved/resolved. No focal neurological deficits. Extremities: Symmetric 5 x 5 power.  Discharge Instructions      Discharge Instructions    Ambulatory referral to Neurology    Complete by:  As directed   An appointment is requested in approximately: 8 weeks     Call MD for:  difficulty breathing, headache or visual disturbances    Complete by:  As directed      Call MD for:  extreme fatigue    Complete by:  As directed      Call MD for:  persistant dizziness or light-headedness    Complete by:  As  directed      Call MD for:  persistant nausea and vomiting    Complete by:  As directed      Call MD for:  severe uncontrolled pain    Complete by:  As directed      Call MD for:  temperature >100.4    Complete by:  As directed      Call MD for:    Complete by:  As directed   Strokelike symptoms.  Strokelike symptoms.     Diet - low sodium heart healthy    Complete by:  As directed      Discharge instructions    Complete by:  As directed   Oxygen via nasal cannula at 2 L/m, continuously but especially with activity.     Increase activity slowly    Complete by:  As directed             Medication List    STOP taking these medications        HYDROcodone-acetaminophen 5-325 MG tablet  Commonly known as:  NORCO/VICODIN     naproxen 500 MG tablet  Commonly known as:  NAPROSYN      TAKE these medications        amoxicillin-clavulanate 875-125 MG tablet  Commonly known as:  AUGMENTIN  Take 1 tablet by mouth 2 (two) times daily.     aspirin EC 325 MG tablet  Take 1 tablet (325 mg total) by mouth daily.     atorvastatin 20 MG  tablet  Commonly known as:  LIPITOR  Take 1 tablet (20 mg total) by mouth daily at 6 PM.     ergocalciferol 50000 UNITS capsule  Commonly known as:  VITAMIN D2  Take 50,000 Units by mouth once a week.     Oxycodone HCl 10 MG Tabs  Take 10 mg by mouth 3 (three) times daily.     pantoprazole 40 MG tablet  Commonly known as:  PROTONIX  Take 1 tablet (40 mg total) by mouth daily.       Follow-up Information    Follow up with PARUCHURI,VAMSEE, MD. Schedule an appointment as soon as possible for a visit in 5 days.   Specialty:  Internal Medicine   Why:  At the [facility] in [place], [place]. Post hospital discharge follow-up. Will need follow-up chest x-ray in 3-4 weeks.      Follow up with SETHI,PRAMOD, MD. Schedule an appointment as soon as possible for a visit in 2 months.   Specialties:  Neurology, Radiology   Why:  Stroke  follow-up.   Contact information:   [address] [place] [address] [phone]        The results of significant diagnostics from this hospitalization (including imaging, microbiology, ancillary and laboratory) are listed below for reference.    Significant Diagnostic Studies: Dg Chest 2 View  12/10/2015  CLINICAL DATA:  Left-sided weakness in the arm and leg. Slurred speech with asymmetrical smile. Fall. Elevated white cell count. EXAM: CHEST  2 VIEW COMPARISON:  06/22/2015 FINDINGS: Atelectasis in the lung bases. Normal heart size and pulmonary vascularity. No focal airspace disease. No blunting of costophrenic angles. No pneumothorax. Tortuous aorta. Degenerative changes in the spine. IMPRESSION: Atelectasis in the lung bases. Electronically Signed   By: Burman Nieves M.D.   On: 12/10/2015 03:15   Dg Pelvis 1-2 Views  12/10/2015  CLINICAL DATA:  Fall yesterday.  Low back and pelvic pain. EXAM: PELVIS - 1-2 VIEW COMPARISON:  None. FINDINGS: Mild degenerative changes in the hips bilaterally and visualized lower lumbar spine. SI joints are symmetric and unremarkable. No acute bony abnormality. Specifically, no fracture, subluxation, or dislocation. Soft tissues are intact. IMPRESSION: No acute bony abnormality. Electronically Signed   By: Charlett Nose M.D.   On: 12/10/2015 09:29   Ct Head Wo Contrast  12/10/2015  CLINICAL DATA:  Left-sided weakness EXAM: CT HEAD WITHOUT CONTRAST TECHNIQUE: Contiguous axial images were obtained from the base of the skull through the vertex without intravenous contrast. COMPARISON:  None. FINDINGS: Ventricles and sulci appear symmetrical. Mild ventricular dilatation consistent with mild central atrophy. No mass effect or midline shift. No abnormal extra-axial fluid collections. Gray-white matter junctions are distinct. Basal cisterns are not effaced. No evidence of acute intracranial hemorrhage. No depressed skull fractures. Retention cyst  in the right maxillary antrum. No acute air-fluid levels in the paranasal sinuses. Mastoid air cells are not opacified. IMPRESSION: No acute intracranial abnormalities.  Mild central atrophy. Electronically Signed   By: Burman Nieves M.D.   On: 12/10/2015 02:57   Mr Brain Wo Contrast  12/10/2015  CLINICAL DATA:  Acute onset of symptoms that started 12/09/2015 around 4 p.m. LEFT-sided weakness. Slurred speech. Stroke risk factors include hypertension. EXAM: MRI HEAD WITHOUT CONTRAST MRA HEAD WITHOUT CONTRAST TECHNIQUE: Multiplanar, multiecho pulse sequences of the brain and surrounding structures were obtained without intravenous contrast. Angiographic images of the head were obtained using MRA technique without contrast. COMPARISON:  None. FINDINGS: MRI HEAD FINDINGS  The patient had difficulty holding still and the exam was prematurely truncated. Overall the exam is diagnostic for ischemia. There is acute infarction affecting the RIGHT paramedian pons. This extends over an anterior-posterior length of approximately 15 mm, but is only 5 mm thick maximally, near its base. Within limits for assessment due to the truncated exam, there is no associated hemorrhage, mass lesion, or extra-axial fluid. Hydrocephalus ex vacuo representing central atrophy. Minor T2 and FLAIR hyperintensities throughout the white matter, likely chronic microvascular ischemic change. Negative orbits. No acute sinus or mastoid disease. No midline abnormality is evident. MRA HEAD FINDINGS There are dolichoectatic but widely patent BILATERAL internal carotid arteries. Both anterior cerebral arteries are robust. Proximal M1 MCA vessels widely patent. Artifactual signal loss at the BILATERAL MCA trifurcations, as well as the BILATERAL ambient segments PCA, as seen on axial source images from series 5. Basilar artery shows no irregularity, stenosis, or dissection. Both vertebrals are contributory, LEFT dominant. No cerebellar branch occlusion.  Proximal PCA vessels widely patent. No visible intracranial aneurysm. 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 This abnormality is not visible on prior CT. Central atrophy with relatively mild small vessel disease. 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 Electronically Signed   By: Elsie Stain M.D.   On: 12/10/2015 09:25   Dg Chest Port 1 View  12/10/2015  CLINICAL DATA:  Shortness of breath with productive cough EXAM: PORTABLE CHEST 1 VIEW COMPARISON:  December 10, 2015 study obtained earlier in the day and October 27, 2014 FINDINGS: There is patchy airspace opacity in the right base. There is mild scarring in the left base. Lungs elsewhere clear. Heart is borderline prominent with pulmonary vascularity within normal limits. No adenopathy. There is degenerative change in the thoracic spine. IMPRESSION: Patchy infiltrate in the right base. Question early pneumonia versus aspiration. Lungs elsewhere are clear except for mild scarring in the left base which is stable. Stable cardiac prominence. Electronically Signed   By: Bretta Bang III M.D.   On: 12/10/2015 10:37   Mr Maxine Glenn Head/brain Wo Cm  12/10/2015  CLINICAL DATA:  Acute onset of symptoms that started 12/09/2015 around 4 p.m. LEFT-sided weakness. Slurred speech. Stroke risk factors include hypertension. EXAM: MRI HEAD WITHOUT CONTRAST MRA HEAD WITHOUT CONTRAST TECHNIQUE: Multiplanar, multiecho pulse sequences of the brain and surrounding structures were obtained without intravenous contrast. Angiographic images of the head were obtained using MRA technique without contrast. COMPARISON:  None. FINDINGS: MRI HEAD FINDINGS The patient had difficulty holding still and the exam was prematurely truncated. Overall the exam is diagnostic for ischemia. There is acute infarction affecting the RIGHT paramedian pons. This extends over an anterior-posterior length of approximately 15 mm,  but is only 5 mm thick maximally, near its base. Within limits for assessment due to the truncated exam, there is no associated hemorrhage, mass lesion, or extra-axial fluid. Hydrocephalus ex vacuo representing central atrophy. Minor T2 and FLAIR hyperintensities throughout the white matter, likely chronic microvascular ischemic change. Negative orbits. No acute sinus or mastoid disease. No midline abnormality is evident. MRA HEAD FINDINGS There are dolichoectatic but widely patent BILATERAL internal carotid arteries. Both anterior cerebral arteries are robust. Proximal M1 MCA vessels widely patent. Artifactual signal loss at the BILATERAL MCA trifurcations, as well as the BILATERAL ambient segments PCA, as seen on axial source images from series 5. Basilar artery shows no irregularity, stenosis, or dissection. Both vertebrals are contributory, LEFT dominant. No cerebellar branch occlusion. Proximal PCA vessels widely patent.  No visible intracranial aneurysm. 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 This abnormality is not visible on prior CT. Central atrophy with relatively mild small vessel disease. 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 Electronically Signed   By: Elsie StainJohn T Curnes M.D.   On: 12/10/2015 09:25    Microbiology: Recent Results (from the past 240 hour(s))  Culture, blood (routine x 2)     Status: None (Preliminary result)   Collection Time: 12/10/15  4:12 AM  Result Value Ref Range Status   Specimen Description BLOOD RIGHT ARM  Final   Special Requests BOTTLES DRAWN AEROBIC AND ANAEROBIC 5ML  Final   Culture NO GROWTH 2 DAYS  Final   Report Status PENDING  Incomplete  Culture, blood (routine x 2)     Status: None (Preliminary result)   Collection Time: 12/10/15  4:17 AM  Result Value Ref Range Status   Specimen Description BLOOD RIGHT HAND  Final   Special Requests AEROBIC BOTTLE ONLY 5ML  Final   Culture NO  GROWTH 2 DAYS  Final   Report Status PENDING  Incomplete     Labs: Basic Metabolic Panel:  Recent Labs Lab 12/10/15 0149 12/10/15 0155 12/10/15 0600  NA 138 141 139  K 4.3 4.3 4.3  CL 104 102 104  CO2 25  --  26  GLUCOSE 115* 118* 118*  BUN 13 16 12   CREATININE 1.01 0.90 0.99  CALCIUM 9.4  --  9.2   Liver Function Tests:  Recent Labs Lab 12/10/15 0149 12/10/15 0600  AST 27 23  ALT 18 19  ALKPHOS 65 66  BILITOT 0.9 0.8  PROT 6.8 6.5  ALBUMIN 3.5 3.1*   No results for input(s): LIPASE, AMYLASE in the last 168 hours. No results for input(s): AMMONIA in the last 168 hours. CBC:  Recent Labs Lab 12/10/15 0149 12/10/15 0155 12/10/15 0600 12/11/15 0503 12/12/15 0500  WBC 23.9*  --  28.5* 18.8* 13.4*  NEUTROABS 21.5*  --  24.8*  --   --   HGB 14.0 15.6 13.4 12.6* 12.8*  HCT 43.4 46.0 41.5 37.7* 39.6  MCV 91.8  --  92.2 90.8 91.2  PLT 229  --  255 241 228   Cardiac Enzymes:  Recent Labs Lab 12/10/15 0600  CKTOTAL 57   BNP: BNP (last 3 results) No results for input(s): BNP in the last 8760 hours.  ProBNP (last 3 results) No results for input(s): PROBNP in the last 8760 hours.  CBG:  Recent Labs Lab 12/10/15 0148  GLUCAP 105*     Signed:  Marcellus ScottHONGALGI,ANAND, MD, FACP, FHM. [facility] Pager [phone]  If 7PM-7AM, please contact night-coverage [email or web address] Password [email or web address] 12/12/2015, 1:18 PM

## 2015-12-12 NOTE — Progress Notes (Signed)
Patient discharged home, escorted to daughter's car via wheelchair and tech. Patient denied any pain, neuro assessment unchanged, patient left with oxygen. Ambulating saturations on 2L [place] are 94%. Patient educated again regarding oxygenation.

## 2015-12-12 NOTE — Progress Notes (Signed)
*  PRELIMINARY RESULTS* Vascular Ultrasound Carotid Duplex (Doppler) has been completed.  Preliminary findings: Bilateral: No significant (1-39%) ICA stenosis. Antegrade vertebral flow.    Farrel DemarkJill Eunice, RDMS, RVT 12/11/15

## 2015-12-12 NOTE — Progress Notes (Signed)
Patient's sitting O2 92% SaO2 with pulse 72, standing SaO2 93% with pulse 80. With ambulation, oxygen saturation is 85% with pulse 78. Patient denies any lightheadedness, dizziness, patient's palor remains normal. Will continue to monitor.

## 2015-12-12 NOTE — Progress Notes (Signed)
Pharmacy Antibiotic Follow-up Note  Vita ErmJames Schiraldi is a 64 y.o. year-old male admitted on 12/10/2015.  The patient is currently on day Day #3 of Unasyn for PNA. To change to po augmentin today.  Assessment/Plan: Augmentin 875mg  po q12h Please consider 7-8 day total LOT with abx  Temp (24hrs), Avg:98.7 F (37.1 C), Min:98.3 F (36.8 C), Max:99 F (37.2 C)   Recent Labs Lab 12/10/15 0149 12/10/15 0600 12/11/15 0503 12/12/15 0500  WBC 23.9* 28.5* 18.8* 13.4*    Recent Labs Lab 12/10/15 0149 12/10/15 0155 12/10/15 0600  CREATININE 1.01 0.90 0.99   Estimated Creatinine Clearance: 99.1 mL/min (by C-G formula based on Cr of 0.99).    Allergies  Allergen Reactions  . Ivp Dye [Iodinated Diagnostic Agents] Other (See Comments)    intolerance    Antimicrobials this admission: 12/16 Unasyn-->12/18 12/18 Augmentin>>  Microbiology results: 12/16 blood cx x 2 --> ngtd  Thank you for allowing pharmacy to be a part of this patient's care.  Lavonia Danamend, Brayley Mackowiak George PharmD 12/12/2015 7:44 AM

## 2015-12-12 NOTE — Care Management Note (Signed)
Case Management Note  Patient Details  Name: Vita ErmJames Malicki MRN: [id number] Date of Birth: July 09, 1951  Subjective/Objective:                  Acute respiratory failure with hypoxia  Action/Plan: Referral for home oxygen called to [facility]. [facility]. Oxygen will be delivered to the patient's room. CM spoke with patient at the bedside. Verbalizes understanding [facility] will provide home oxygen.   Expected Discharge Date:                  Expected Discharge Plan:  Home/Self Care  In-House Referral:     Discharge planning Services  CM Consult  Post Acute Care Choice:    Choice offered to:     DME Arranged:  Oxygen DME Agency:  [facility]  HH Arranged:  NA HH Agency:  NA  Status of Service:  Completed, signed off  Medicare Important Message Given:    Date Medicare IM Given:    Medicare IM give by:    Date Additional Medicare IM Given:    Additional Medicare Important Message give by:     If discussed at Long Length of Stay Meetings, dates discussed:    Additional Comments:  Antony HasteBennett, Kurtiss Wence Harris, RN 12/12/2015, 2:25 PM

## 2015-12-12 NOTE — Discharge Instructions (Signed)
Stroke Prevention Some medical conditions and behaviors are associated with an increased chance of having a stroke. You may prevent a stroke by making healthy choices and managing medical conditions. HOW CAN I REDUCE MY RISK OF HAVING A STROKE?   Stay physically active. Get at least 30 minutes of activity on most or all days.  Do not smoke. It may also be helpful to avoid exposure to secondhand smoke.  Limit alcohol use. Moderate alcohol use is considered to be:  No more than 2 drinks per day for men.  No more than 1 drink per day for nonpregnant women.  Eat healthy foods. This involves:  Eating 5 or more servings of fruits and vegetables a day.  Making dietary changes that address high blood pressure (hypertension), high cholesterol, diabetes, or obesity.  Manage your cholesterol levels.  Making food choices that are high in fiber and low in saturated fat, trans fat, and cholesterol may control cholesterol levels.  Take any prescribed medicines to control cholesterol as directed by your health care provider.  Manage your diabetes.  Controlling your carbohydrate and sugar intake is recommended to manage diabetes.  Take any prescribed medicines to control diabetes as directed by your health care provider.  Control your hypertension.  Making food choices that are low in salt (sodium), saturated fat, trans fat, and cholesterol is recommended to manage hypertension.  Ask your health care provider if you need treatment to lower your blood pressure. Take any prescribed medicines to control hypertension as directed by your health care provider.  If you are 75-81 years of age, have your blood pressure checked every 3-5 years. If you are 35 years of age or older, have your blood pressure checked every year.  Maintain a healthy weight.  Reducing calorie intake and making food choices that are low in sodium, saturated fat, trans fat, and cholesterol are recommended to manage  weight.  Stop drug abuse.  Avoid taking birth control pills.  Talk to your health care provider about the risks of taking birth control pills if you are over 67 years old, smoke, get migraines, or have ever had a blood clot.  Get evaluated for sleep disorders (sleep apnea).  Talk to your health care provider about getting a sleep evaluation if you snore a lot or have excessive sleepiness.  Take medicines only as directed by your health care provider.  For some people, aspirin or blood thinners (anticoagulants) are helpful in reducing the risk of forming abnormal blood clots that can lead to stroke. If you have the irregular heart rhythm of atrial fibrillation, you should be on a blood thinner unless there is a good reason you cannot take them.  Understand all your medicine instructions.  Make sure that other conditions (such as anemia or atherosclerosis) are addressed. SEEK IMMEDIATE MEDICAL CARE IF:   You have sudden weakness or numbness of the face, arm, or leg, especially on one side of the body.  Your face or eyelid droops to one side.  You have sudden confusion.  You have trouble speaking (aphasia) or understanding.  You have sudden trouble seeing in one or both eyes.  You have sudden trouble walking.  You have dizziness.  You have a loss of balance or coordination.  You have a sudden, severe headache with no known cause.  You have new chest pain or an irregular heartbeat. Any of these symptoms may represent a serious problem that is an emergency. Do not wait to see if the symptoms will  go away. Get medical help at once. Call your local emergency services (911 in [place] Do not drive yourself to the hospital.   This information is not intended to replace advice given to you by your health care provider. Make sure you discuss any questions you have with your health care provider.   Document Released: 01/18/2005 Document Revised: 01/01/2015 Document Reviewed:  06/13/2013 Elsevier Interactive Patient Education 2016 Elsevier Inc.  Aspiration Pneumonia Aspiration pneumonia is an infection in your lungs. It occurs when food, liquid, or stomach contents (vomit) are inhaled (aspirated) into your lungs. When these things get into your lungs, swelling (inflammation) and infection can occur. This can make it difficult for you to breathe. Aspiration pneumonia is a serious condition and can be life threatening. RISK FACTORS Aspiration pneumonia is more likely to occur when a person's cough (gag) reflex or ability to swallow has been decreased. Some things that can do this include:   Having a brain injury or disease, such as stroke, seizures, Parkinson's disease, dementia, or amyotrophic lateral sclerosis (ALS).   Being given general anesthetic for procedures.   Being in a coma (unconscious).   Having a narrowing of the tube that carries food to the stomach (esophagus).   Drinking too much alcohol. If a person passes out and vomits, vomit can be swallowed into the lungs.   Taking certain medicines, such as tranquilizers or sedatives.  SIGNS AND SYMPTOMS   Coughing after swallowing food or liquids.   Breathing problems, such as wheezing or shortness of breath.   Bluish skin. This can be caused by lack of oxygen.   Coughing up food or mucus. The mucus might contain blood, greenish material, or yellowish-white fluid (pus).   Fever.   Chest pain.   Being more tired than usual (fatigue).   Sweating more than usual.   Bad breath.  DIAGNOSIS  A physical exam will be done. During the exam, the health care provider will listen to your lungs with a stethoscope to check for:   Crackling sounds in the lungs.  Decreased breath sounds.  A rapid heartbeat. Various tests may be ordered. These may include:   Chest X-ray.   CT scan.   Swallowing study. This test looks at how food is swallowed and whether it goes into your breathing  tube (trachea) or food pipe (esophagus).   Sputum culture. Saliva and mucus (sputum) are collected from the lungs or the tubes that carry air to the lungs (bronchi). The sputum is then tested for bacteria.   Bronchoscopy. This test uses a flexible tube (bronchoscope) to see inside the lungs. TREATMENT  Treatment will usually include antibiotic medicines. Other medicines may also be used to reduce fever or pain. You may need to be treated in the hospital. In the hospital, your breathing will be carefully monitored. Depending on how well you are breathing, you may need to be given oxygen, or you may need breathing support from a breathing machine (ventilator). For people who fail a swallowing study, a feeding tube might be placed in the stomach, or they may be asked to avoid certain food textures or liquids when they eat. HOME CARE INSTRUCTIONS   Carefully follow any special eating instructions you were given, such as avoiding certain food textures or thickening liquids. This reduces the risk of developing aspiration pneumonia again.  Only take over-the-counter or prescription medicines as directed by your health care provider. Follow the directions carefully.   If you were prescribed antibiotics, take them as  directed. Finish them even if you start to feel better.   Rest as instructed by your health care provider.   Keep all follow-up appointments with your health care provider.  SEEK MEDICAL CARE IF:   You develop worsening shortness of breath, wheezing, or difficulty breathing.   You develop a fever.   You have chest pain.  MAKE SURE YOU:   Understand these instructions.  Will watch your condition.  Will get help right away if you are not doing well or get worse.   This information is not intended to replace advice given to you by your health care provider. Make sure you discuss any questions you have with your health care provider.   Document Released: 10/08/2009  Document Revised: 12/16/2013 Document Reviewed: 05/29/2013 Elsevier Interactive Patient Education [employer]2016 Elsevier Inc.

## 2015-12-12 NOTE — Progress Notes (Signed)
RN discussed discharge instructions with patient. Patient vocalized understanding of follow up instructions, new medications, EMMI, and DME oxygen. Patient is to be discharged with 2L [place] d/t desaturation r/t pneumonia. Patient was given discharge instructions, prescriptions have been escribed to pharmacy which was confirmed by patient. EMMI consent signed, faxed, order placed. IV removed, tele removed. Patient is currently waiting on DME oxygen delivery to room. Patient to be escorted by wheelchair with nursing staff to daughter's car for transportation.

## 2015-12-12 NOTE — Progress Notes (Signed)
SATURATION QUALIFICATIONS: (This note is used to comply with regulatory documentation for home oxygen)  Patient Saturations on Room Air at Rest = 91% Patient Saturations on Room Air while Ambulating = 86% Patient Saturations on 2 Liters of oxygen while Ambulating = 92%  Please briefly explain why patient needs home oxygen: Patient desaturated while ambulating. Patient's saturations also occasionally desaturate while patient is sitting in chair. Patient's O2 will also desaturate to 85% while sleeping.

## 2015-12-15 LAB — CULTURE, BLOOD (ROUTINE X 2)
CULTURE: NO GROWTH
Culture: NO GROWTH

## 2015-12-20 ENCOUNTER — Inpatient Hospital Stay (HOSPITAL_BASED_OUTPATIENT_CLINIC_OR_DEPARTMENT_OTHER)
Admission: EM | Admit: 2015-12-20 | Discharge: 2015-12-22 | DRG: 871 | Disposition: A | Discharge status: Home / Self Care | Payer: Medicare Other | Attending: Internal Medicine | Admitting: Internal Medicine

## 2015-12-20 ENCOUNTER — Encounter (HOSPITAL_BASED_OUTPATIENT_CLINIC_OR_DEPARTMENT_OTHER): Payer: Self-pay

## 2015-12-20 ENCOUNTER — Emergency Department (HOSPITAL_BASED_OUTPATIENT_CLINIC_OR_DEPARTMENT_OTHER): Payer: Medicare Other

## 2015-12-20 DIAGNOSIS — G4733 Obstructive sleep apnea (adult) (pediatric): Secondary | ICD-10-CM | POA: Diagnosis present

## 2015-12-20 DIAGNOSIS — E785 Hyperlipidemia, unspecified: Secondary | ICD-10-CM | POA: Diagnosis present

## 2015-12-20 DIAGNOSIS — F1010 Alcohol abuse, uncomplicated: Secondary | ICD-10-CM | POA: Diagnosis not present

## 2015-12-20 DIAGNOSIS — Z8673 Personal history of transient ischemic attack (TIA), and cerebral infarction without residual deficits: Secondary | ICD-10-CM | POA: Diagnosis not present

## 2015-12-20 DIAGNOSIS — I10 Essential (primary) hypertension: Secondary | ICD-10-CM | POA: Diagnosis present

## 2015-12-20 DIAGNOSIS — G8929 Other chronic pain: Secondary | ICD-10-CM | POA: Diagnosis present

## 2015-12-20 DIAGNOSIS — Z86718 Personal history of other venous thrombosis and embolism: Secondary | ICD-10-CM

## 2015-12-20 DIAGNOSIS — A419 Sepsis, unspecified organism: Secondary | ICD-10-CM | POA: Diagnosis present

## 2015-12-20 DIAGNOSIS — F1990 Other psychoactive substance use, unspecified, uncomplicated: Secondary | ICD-10-CM | POA: Diagnosis not present

## 2015-12-20 DIAGNOSIS — Z7982 Long term (current) use of aspirin: Secondary | ICD-10-CM | POA: Diagnosis not present

## 2015-12-20 DIAGNOSIS — M5431 Sciatica, right side: Secondary | ICD-10-CM | POA: Diagnosis present

## 2015-12-20 DIAGNOSIS — I517 Cardiomegaly: Secondary | ICD-10-CM | POA: Diagnosis not present

## 2015-12-20 DIAGNOSIS — Z79899 Other long term (current) drug therapy: Secondary | ICD-10-CM

## 2015-12-20 DIAGNOSIS — R509 Fever, unspecified: Secondary | ICD-10-CM | POA: Diagnosis present

## 2015-12-20 DIAGNOSIS — J690 Pneumonitis due to inhalation of food and vomit: Secondary | ICD-10-CM | POA: Diagnosis not present

## 2015-12-20 DIAGNOSIS — F1910 Other psychoactive substance abuse, uncomplicated: Secondary | ICD-10-CM | POA: Insufficient documentation

## 2015-12-20 DIAGNOSIS — J189 Pneumonia, unspecified organism: Secondary | ICD-10-CM | POA: Diagnosis not present

## 2015-12-20 DIAGNOSIS — Z23 Encounter for immunization: Secondary | ICD-10-CM | POA: Diagnosis not present

## 2015-12-20 DIAGNOSIS — F1410 Cocaine abuse, uncomplicated: Secondary | ICD-10-CM | POA: Diagnosis present

## 2015-12-20 DIAGNOSIS — Z87891 Personal history of nicotine dependence: Secondary | ICD-10-CM

## 2015-12-20 DIAGNOSIS — Y95 Nosocomial condition: Secondary | ICD-10-CM | POA: Diagnosis not present

## 2015-12-20 DIAGNOSIS — I639 Cerebral infarction, unspecified: Secondary | ICD-10-CM | POA: Diagnosis present

## 2015-12-20 DIAGNOSIS — K219 Gastro-esophageal reflux disease without esophagitis: Secondary | ICD-10-CM | POA: Diagnosis present

## 2015-12-20 HISTORY — DX: Unspecified osteoarthritis, unspecified site: M19.90

## 2015-12-20 HISTORY — DX: Cerebral infarction, unspecified: I63.9

## 2015-12-20 HISTORY — DX: Sciatica, right side: M54.31

## 2015-12-20 HISTORY — DX: Anxiety disorder, unspecified: F41.9

## 2015-12-20 HISTORY — DX: Acute embolism and thrombosis of unspecified deep veins of unspecified lower extremity: I82.409

## 2015-12-20 HISTORY — DX: Calculus of kidney: N20.0

## 2015-12-20 HISTORY — DX: Hyperlipidemia, unspecified: E78.5

## 2015-12-20 HISTORY — DX: Depression, unspecified: F32.A

## 2015-12-20 HISTORY — DX: Pneumonitis due to inhalation of food and vomit: J69.0

## 2015-12-20 HISTORY — DX: Pneumonia, unspecified organism: J18.9

## 2015-12-20 HISTORY — DX: Gastro-esophageal reflux disease without esophagitis: K21.9

## 2015-12-20 HISTORY — DX: Major depressive disorder, single episode, unspecified: F32.9

## 2015-12-20 HISTORY — DX: Dependence on supplemental oxygen: Z99.81

## 2015-12-20 LAB — CBC WITH DIFFERENTIAL/PLATELET
BASOS ABS: 0 10*3/uL (ref 0.0–0.1)
BASOS PCT: 0 %
EOS PCT: 1 %
Eosinophils Absolute: 0.3 10*3/uL (ref 0.0–0.7)
HCT: 40.3 % (ref 39.0–52.0)
Hemoglobin: 12.6 g/dL — ABNORMAL LOW (ref 13.0–17.0)
LYMPHS PCT: 6 %
Lymphs Abs: 1.4 10*3/uL (ref 0.7–4.0)
MCH: 28.6 pg (ref 26.0–34.0)
MCHC: 31.3 g/dL (ref 30.0–36.0)
MCV: 91.6 fL (ref 78.0–100.0)
Monocytes Absolute: 0.8 10*3/uL (ref 0.1–1.0)
Monocytes Relative: 3 %
NEUTROS ABS: 20.7 10*3/uL — AB (ref 1.7–7.7)
Neutrophils Relative %: 90 %
PLATELETS: 330 10*3/uL (ref 150–400)
RBC: 4.4 MIL/uL (ref 4.22–5.81)
RDW: 14.3 % (ref 11.5–15.5)
WBC: 23.2 10*3/uL — AB (ref 4.0–10.5)

## 2015-12-20 LAB — URINALYSIS, ROUTINE W REFLEX MICROSCOPIC
Bilirubin Urine: NEGATIVE
GLUCOSE, UA: NEGATIVE mg/dL
Hgb urine dipstick: NEGATIVE
Ketones, ur: NEGATIVE mg/dL
LEUKOCYTES UA: NEGATIVE
NITRITE: NEGATIVE
PROTEIN: NEGATIVE mg/dL
Specific Gravity, Urine: 1.025 (ref 1.005–1.030)
pH: 5 (ref 5.0–8.0)

## 2015-12-20 LAB — COMPREHENSIVE METABOLIC PANEL
ALT: 13 U/L — ABNORMAL LOW (ref 17–63)
ANION GAP: 4 — AB (ref 5–15)
AST: 23 U/L (ref 15–41)
Albumin: 3.2 g/dL — ABNORMAL LOW (ref 3.5–5.0)
Alkaline Phosphatase: 62 U/L (ref 38–126)
BILIRUBIN TOTAL: 0.5 mg/dL (ref 0.3–1.2)
BUN: 13 mg/dL (ref 6–20)
CO2: 27 mmol/L (ref 22–32)
Calcium: 8.6 mg/dL — ABNORMAL LOW (ref 8.9–10.3)
Chloride: 104 mmol/L (ref 101–111)
Creatinine, Ser: 0.88 mg/dL (ref 0.61–1.24)
GFR calc Af Amer: >60 mL/min (ref >60)
Glucose, Bld: 129 mg/dL — ABNORMAL HIGH (ref 65–99)
POTASSIUM: 4.6 mmol/L (ref 3.5–5.1)
Sodium: 135 mmol/L (ref 135–145)
TOTAL PROTEIN: 7.3 g/dL (ref 6.5–8.1)

## 2015-12-20 LAB — I-STAT ARTERIAL BLOOD GAS, ED
Acid-Base Excess: 2 mmol/L (ref 0.0–2.0)
Bicarbonate: 27.4 mEq/L — ABNORMAL HIGH (ref 20.0–24.0)
O2 Saturation: 90 %
PCO2 ART: 46.3 mmHg — AB (ref 35.0–45.0)
PH ART: 7.387 (ref 7.350–7.450)
TCO2: 29 mmol/L (ref 0–100)
pO2, Arterial: 66 mmHg — ABNORMAL LOW (ref 80.0–100.0)

## 2015-12-20 LAB — STREP PNEUMONIAE URINARY ANTIGEN: Strep Pneumo Urinary Antigen: NEGATIVE

## 2015-12-20 LAB — I-STAT CG4 LACTIC ACID, ED: Lactic Acid, Venous: 1.55 mmol/L (ref 0.5–2.0)

## 2015-12-20 MED ORDER — PNEUMOCOCCAL VAC POLYVALENT 25 MCG/0.5ML IJ INJ
0.5000 mL | INJECTION | INTRAMUSCULAR | Status: AC
  Administered 2015-12-21: 0.5 mL via INTRAMUSCULAR
  Filled 2015-12-20: qty 0.5

## 2015-12-20 MED ORDER — SODIUM CHLORIDE 0.9 % IV SOLN
2000.0000 mg | Freq: Once | INTRAVENOUS | Status: DC
  Filled 2015-12-20: qty 2000

## 2015-12-20 MED ORDER — LORAZEPAM 2 MG/ML IJ SOLN: 1.0000 mg | Freq: Four times a day (QID) | INTRAMUSCULAR | Status: DC | PRN

## 2015-12-20 MED ORDER — SODIUM CHLORIDE 0.9 % IV SOLN
INTRAVENOUS | Status: DC
  Administered 2015-12-20: 1000 mL via INTRAVENOUS
  Administered 2015-12-21: 04:00:00 via INTRAVENOUS

## 2015-12-20 MED ORDER — DEXTROSE 5 % IV SOLN
2.0000 g | Freq: Three times a day (TID) | INTRAVENOUS | Status: DC
  Administered 2015-12-20 – 2015-12-22 (×6): 2 g via INTRAVENOUS
  Filled 2015-12-20 (×9): qty 2

## 2015-12-20 MED ORDER — VITAMIN B-1 100 MG PO TABS
100.0000 mg | ORAL_TABLET | Freq: Every day | ORAL | Status: DC
  Administered 2015-12-20 – 2015-12-22 (×3): 100 mg via ORAL
  Filled 2015-12-20 (×3): qty 1

## 2015-12-20 MED ORDER — FOLIC ACID 1 MG PO TABS
1.0000 mg | ORAL_TABLET | Freq: Every day | ORAL | Status: DC
  Administered 2015-12-20 – 2015-12-22 (×3): 1 mg via ORAL
  Filled 2015-12-20 (×3): qty 1

## 2015-12-20 MED ORDER — SODIUM CHLORIDE 0.9 % IV BOLUS (SEPSIS)
1000.0000 mL | Freq: Once | INTRAVENOUS | Status: AC
  Administered 2015-12-20: 1000 mL via INTRAVENOUS

## 2015-12-20 MED ORDER — TRAMADOL HCL 50 MG PO TABS
50.0000 mg | ORAL_TABLET | Freq: Two times a day (BID) | ORAL | Status: DC | PRN
  Administered 2015-12-20 – 2015-12-22 (×4): 50 mg via ORAL
  Filled 2015-12-20 (×4): qty 1

## 2015-12-20 MED ORDER — ATORVASTATIN CALCIUM 20 MG PO TABS
20.0000 mg | ORAL_TABLET | Freq: Every day | ORAL | Status: DC
  Administered 2015-12-20 – 2015-12-21 (×2): 20 mg via ORAL
  Filled 2015-12-20: qty 1

## 2015-12-20 MED ORDER — ASPIRIN EC 325 MG PO TBEC
325.0000 mg | DELAYED_RELEASE_TABLET | Freq: Every day | ORAL | Status: DC
  Administered 2015-12-20 – 2015-12-22 (×3): 325 mg via ORAL
  Filled 2015-12-20 (×3): qty 1

## 2015-12-20 MED ORDER — OXYCODONE HCL 5 MG PO TABS
10.0000 mg | ORAL_TABLET | Freq: Two times a day (BID) | ORAL | Status: DC | PRN
  Administered 2015-12-20: 10 mg via ORAL
  Administered 2015-12-21 – 2015-12-22 (×3): 20 mg via ORAL
  Filled 2015-12-20: qty 2
  Filled 2015-12-20 (×4): qty 4

## 2015-12-20 MED ORDER — VANCOMYCIN HCL 10 G IV SOLR
2000.0000 mg | Freq: Once | INTRAVENOUS | Status: AC
  Administered 2015-12-20: 2000 mg via INTRAVENOUS
  Filled 2015-12-20: qty 2000

## 2015-12-20 MED ORDER — ACETAMINOPHEN 500 MG PO TABS
1000.0000 mg | ORAL_TABLET | Freq: Once | ORAL | Status: AC
  Administered 2015-12-20: 1000 mg via ORAL
  Filled 2015-12-20: qty 2

## 2015-12-20 MED ORDER — ENOXAPARIN SODIUM 40 MG/0.4ML ~~LOC~~ SOLN
40.0000 mg | SUBCUTANEOUS | Status: DC
  Administered 2015-12-20: 40 mg via SUBCUTANEOUS
  Filled 2015-12-20: qty 0.4

## 2015-12-20 MED ORDER — ADULT MULTIVITAMIN W/MINERALS CH
1.0000 | ORAL_TABLET | Freq: Every day | ORAL | Status: DC
  Administered 2015-12-20 – 2015-12-22 (×3): 1 via ORAL
  Filled 2015-12-20 (×3): qty 1

## 2015-12-20 MED ORDER — PIPERACILLIN-TAZOBACTAM 3.375 G IVPB 30 MIN
3.3750 g | Freq: Once | INTRAVENOUS | Status: AC
  Administered 2015-12-20: 3.375 g via INTRAVENOUS
  Filled 2015-12-20 (×2): qty 50

## 2015-12-20 MED ORDER — OXYCODONE HCL 20 MG PO TABS: 20.0000 mg | ORAL_TABLET | Freq: Two times a day (BID) | ORAL | Status: DC | PRN

## 2015-12-20 MED ORDER — ACETAMINOPHEN 500 MG PO TABS: 1000.0000 mg | ORAL_TABLET | Freq: Three times a day (TID) | ORAL | Status: DC | PRN

## 2015-12-20 MED ORDER — THIAMINE HCL 100 MG/ML IJ SOLN
100.0000 mg | Freq: Every day | INTRAMUSCULAR | Status: DC
  Filled 2015-12-20: qty 2

## 2015-12-20 MED ORDER — DEXTROSE 5 % IV SOLN
1.0000 g | Freq: Three times a day (TID) | INTRAVENOUS | Status: DC
  Filled 2015-12-20 (×2): qty 1

## 2015-12-20 MED ORDER — PANTOPRAZOLE SODIUM 40 MG PO TBEC
40.0000 mg | DELAYED_RELEASE_TABLET | Freq: Every day | ORAL | Status: DC
  Administered 2015-12-20 – 2015-12-22 (×3): 40 mg via ORAL
  Filled 2015-12-20 (×3): qty 1

## 2015-12-20 MED ORDER — VANCOMYCIN HCL IN DEXTROSE 750-5 MG/150ML-% IV SOLN
750.0000 mg | Freq: Two times a day (BID) | INTRAVENOUS | Status: DC
  Administered 2015-12-20 – 2015-12-22 (×4): 750 mg via INTRAVENOUS
  Filled 2015-12-20 (×5): qty 150

## 2015-12-20 MED ORDER — ZOLPIDEM TARTRATE 5 MG PO TABS
5.0000 mg | ORAL_TABLET | Freq: Once | ORAL | Status: AC
  Administered 2015-12-20: 5 mg via ORAL
  Filled 2015-12-20: qty 1

## 2015-12-20 MED ORDER — LORAZEPAM 1 MG PO TABS
1.0000 mg | ORAL_TABLET | Freq: Four times a day (QID) | ORAL | Status: DC | PRN
  Administered 2015-12-21: 1 mg via ORAL
  Filled 2015-12-20: qty 1

## 2015-12-20 MED ORDER — OXYCODONE HCL 5 MG PO TABS
10.0000 mg | ORAL_TABLET | Freq: Three times a day (TID) | ORAL | Status: DC
  Administered 2015-12-20 (×2): 10 mg via ORAL
  Filled 2015-12-20 (×2): qty 2

## 2015-12-20 MED ORDER — VANCOMYCIN HCL 500 MG IV SOLR
INTRAVENOUS | Status: AC
  Filled 2015-12-20: qty 2000

## 2015-12-20 NOTE — ED Notes (Signed)
Pt woke about an hour ago with increased SOB, is currently being treated for PNA, started treatment on 12/16.  Diminished lung sounds, fever currently, had 5mg  albuterol and 0.5 atrovent en route with 125mg  solumedrol.

## 2015-12-20 NOTE — Care Management Note (Signed)
Case Management Note  Patient Details  Name: [name] MRN: [id number] Date of Birth: [birth year]  Subjective/Objective:  Patient admitted with Pneumonia. Patient lives at home with his parents to help assist with his mother.                 Action/Plan: CM will continue to follow for discharge needs.    Expected Discharge Date:                  Expected Discharge Plan:     In-House Referral:     Discharge planning Services     Post Acute Care Choice:    Choice offered to:     DME Arranged:    DME Agency:     HH Arranged:    HH Agency:     Status of Service:  In process, will continue to follow  Medicare Important Message Given:    Date Medicare IM Given:    Medicare IM give by:    Date Additional Medicare IM Given:    Additional Medicare Important Message give by:     If discussed at Long Length of Stay Meetings, dates discussed:    Additional Comments:  Kermit BaloKelli F Asjia Berrios, RN 12/20/2015, 1:22 PM

## 2015-12-20 NOTE — Progress Notes (Signed)
Pt c/o numbness in right hand. Pt reports it started  3 hours ago , and started with pain. Pt denies numbness in other parts of the body.  Strong grip on rt hand, sensation intact. On-call provider Merdis DelayK Schorr, NP notified via text page. No new orders received at this time. Will cont to monitor.

## 2015-12-20 NOTE — Progress Notes (Signed)
Patient trasfered from [facility] to [phone] via [facility]; alert and oriented x 4; no complaints of pain; IV saline locked in LAC and fluids running in left hand; skin intact. Orient patient to room and unit; watch safety video; gave patient care guide; instructed how to use the call bell and  fall risk precautions. Will continue to monitor the patient.

## 2015-12-20 NOTE — H&P (Signed)
[facility] History and Physical  Donta Fuster ZOX:[id number] DOB: Jul 02, 1951 DOA: 12/20/2015  Referring physician: Emergency Department PCP: Dennard Schaumann, MD   CHIEF COMPLAINT:   Shortness of breath   HPI: George Frank is a 64 y.o. male with recent hospitalization for CVA and aspiration pneuomonia (RLL), discharged 12/18 on augmentin.Patient saw PCP on Friday, given "something else for PNA". Still taking Augmentin at home. No coughing. No chest pain.  Patient presented to [facility] with increasing shortness of breath and fever. CXR showed worsening infiltrates, WBC 23K.Given dose of Vanc and Zosyn  Patient denies any residual weakness from stroke. No longer needs assistive device to walk. He has chronic LLE swelling secondary to remote trauma. He has pain related to sciatica. No other medical complaints  ED COURSE:   [facility] ABGs pH 7.3,  pCO2 46 bicarbonate 27  Labs:   Lactic acid 1.55 Anon gap 4 Calcitonin 0.27 WBC 23,  absolute neutrophils 20.7   CXR:    Interval worsening of bibasilar airspace opacities. No consolidation or significant pleural effusion        EKG:  Sinus rhythm No significant change since last tracing Confirmed by KNOTT MD, DANIEL [phone]) on 12/20/2015 7:17:25 A                      Medications  vancomycin (VANCOCIN) 2,000 mg in sodium chloride 0.9 % 500 mL IVPB (2,000 mg Intravenous Transfusing/Transfer 12/20/15 0858)  acetaminophen (TYLENOL) tablet 1,000 mg (1,000 mg Oral Given 12/20/15 0701)  piperacillin-tazobactam (ZOSYN) IVPB 3.375 g (0 g Intravenous Stopped 12/20/15 0825)  sodium chloride 0.9 % bolus 1,000 mL (0 mLs Intravenous Stopped 12/20/15 0858)    Followed by  sodium chloride 0.9 % bolus 1,000 mL (0 mLs Intravenous Stopped 12/20/15 0858)  vancomycin (VANCOCIN) 500 MG powder (  Duplicate 12/20/15 0754)    Review of Systems  Constitutional: Negative.   HENT: Negative.   Eyes: Negative.   Respiratory: Positive for shortness of  breath.   Cardiovascular: Positive for leg swelling.  Gastrointestinal: Negative.   Genitourinary: Negative.   Musculoskeletal: Negative.   Skin: Negative.   Neurological: Negative.   Endo/Heme/Allergies: Negative.   Psychiatric/Behavioral: Negative.     Past Medical History  Diagnosis Date  . Sciatica   . Hypertension   . Enlarged heart   . Phlebitis   . Sleep apnea   . Pneumonia    Past Surgical History  Procedure Laterality Date  . Knee cartilage surgery    . Femur fracture surgery      SOCIAL HISTORY:  reports that he quit smoking about 23 years ago. He has never used smokeless tobacco. He reports that he drinks alcohol. He reports that he uses illicit drugs (Cocaine). Lives: at home with mother    Assistive devices:   None needed for ambulation.   Allergies  Allergen Reactions  . Ivp Dye [Iodinated Diagnostic Agents] Other (See Comments)    intolerance    Family History  Problem Relation Age of Onset  . Stroke Father   . Diabetes Mellitus II Neg Hx     Prior to Admission medications   Medication Sig Start Date End Date Taking? Authorizing Provider  amoxicillin-clavulanate (AUGMENTIN) 875-125 MG tablet Take 1 tablet by mouth 2 (two) times daily. 12/12/15  Yes Elease Etienne, MD  aspirin EC 325 MG tablet Take 1 tablet (325 mg total) by mouth daily. 12/12/15  Yes Elease Etienne, MD  atorvastatin (LIPITOR) 20 MG tablet Take 1 tablet (  20 mg total) by mouth daily at 6 PM. 12/12/15  Yes Elease EtienneAnand D Hongalgi, MD  ergocalciferol (VITAMIN D2) 50000 UNITS capsule Take 50,000 Units by mouth once a week.   Yes Historical Provider, MD  Oxycodone HCl 10 MG TABS Take 10 mg by mouth 3 (three) times daily.   Yes Historical Provider, MD  pantoprazole (PROTONIX) 40 MG tablet Take 1 tablet (40 mg total) by mouth daily. 12/12/15  Yes Elease EtienneAnand D Hongalgi, MD   PHYSICAL EXAMCeasar Mons: Filed Vitals:   12/20/15 0830 12/20/15 0839 12/20/15 0854 12/20/15 0942  BP: 139/82 142/84  136/71  Pulse: 74  78  79  Temp:   99.6 F (37.6 C) 99.3 F (37.4 C)  TempSrc:   Oral Axillary  Resp: 15 22    Height:    5\' 11"  (1.803 m)  Weight:    120 kg (264 lb 8.8 oz)  SpO2: 98% 97%  98%    Wt Readings from Last 3 Encounters:  12/20/15 120 kg (264 lb 8.8 oz)  12/10/15 120.657 kg (266 lb)  10/27/14 127.007 kg (280 lb)    General:  Pleasant white male. Appears calm and comfortable Eyes: PER, normal lids, irises & conjunctiva ENT: grossly normal hearing, lips & tongue Neck: no LAD, no masses Cardiovascular: RRR, no murmurs. No LE edema.  Respiratory: Respirations even and unlabored. Inspiratory crackles RLL. No wheezes.   Abdomen: soft, non-distended, non-tender, active bowel sounds. No obvious masses.  Skin: no rash seen on limited exam Musculoskeletal: grossly normal tone BUE/BLE Psychiatric: grossly normal mood and affect, speech fluent and appropriate Neurologic: grossly non-focal.         LABS ON ADMISSION:    Basic Metabolic Panel:  Recent Labs Lab 12/20/15 0650  NA 135  K 4.6  CL 104  CO2 27  GLUCOSE 129*  BUN 13  CREATININE 0.88  CALCIUM 8.6*   Liver Function Tests:  Recent Labs Lab 12/20/15 0650  AST 23  ALT 13*  ALKPHOS 62  BILITOT 0.5  PROT 7.3  ALBUMIN 3.2*    CBC:  Recent Labs Lab 12/20/15 0650  WBC 23.2*  NEUTROABS 20.7*  HGB 12.6*  HCT 40.3  MCV 91.6  PLT 330   CREATININE: 0.88 (12/20/15 0650) Estimated creatinine clearance - 110.3 mL/min  Radiological Exams on Admission: Dg Chest Port 1 View  12/20/2015  CLINICAL DATA:  Worsening shortness of breath with fever. Currently being treated for pneumonia. EXAM: PORTABLE CHEST 1 VIEW COMPARISON:  Radiographs 12/10/2015 and 06/22/2015. FINDINGS: The heart size and mediastinal contours are stable allowing for slightly lower lung volumes. There has been interval worsening in the right-greater-than-left basilar airspace opacities. There is no significant pleural effusion. The bones appear unchanged.  IMPRESSION: Interval worsening of bibasilar airspace opacities. No consolidation or significant pleural effusion. Electronically Signed   By: Carey BullocksWilliam  Veazey M.D.   On: 12/20/2015 07:14    ASSESSMENT / PLAN   HCAP.  Hospitalized last month with CVA and aspiration PNA (RLL). SLP evaluated, felt he was at mild risk for aspiration.  Now with bibasilar airspace opacities on CXR, fever > 101 and WBC of 23.   -admit to Medical bed -IV antibiotics -Denies dysphagia or coughing with PO intake but need to be sure he isn't continuing to aspirate. Will ask SLP to re-evaluate patient - MBSS?  -given illicit drug use will obtain HIV / RPR  Essential hypertension. BP stable. Not on anti-hypertensives at home.  -monitor for now  Obstructive sleep apnea.  Couldn't  tolerate CPAP. Uses 02 at home prn  Sciatica. Continue home Oxycodone as needed    Illicit drug use. UDS + for cocaine last month   CONSULTANTS:  none   Code Status: Full code DVT Prophylaxis: Lovenox  Family Communication:  Patient alert, oriented and understands plan of care.  Disposition Plan: Discharge to home in 3-4 days   Time spent: 60 minutes Willette Cluster  NP [facility] Pager [phone]

## 2015-12-20 NOTE — Progress Notes (Addendum)
From [facility], Dr. Clydene PughKnott, EDP 64 year old with history of recent hospitalization for CVA and aspiration pneuomonia, discharged with augmentin.  [facility] breath overnight and fever. CXR showed worsening infiltrates.  Patient currently on 2L O2.  Currently on Vanc/Zosyn. Being admitted for Sepsis secondary to HCAP  [facility].   Time spent: 10 minute  Briele Lagasse D.O. [facility] Pager [phone]  If 7PM-7AM, please contact night-coverage [email or web address] Password [email or web address] 12/20/2015, 7:59 AM

## 2015-12-20 NOTE — ED Notes (Signed)
Pt removed from [facility] at this time per Dr. Clydene PughKnott. Placed on 50% VM for transport by carelink.

## 2015-12-20 NOTE — ED Provider Notes (Signed)
CSN: [id number]     Arrival date & time 12/20/15  4098 History   First MD Initiated Contact with Patient 12/20/15 [phone]     Chief Complaint  Patient presents with  . Shortness of Breath     (Consider location/radiation/quality/duration/timing/severity/associated sxs/prior Treatment) Patient is a 64 y.o. male presenting with shortness of breath. The history is provided by the patient.  Shortness of Breath Severity:  Severe Onset quality:  Gradual Duration:  1 day Timing:  Constant Progression:  Worsening Chronicity:  Recurrent Context: URI   Context comment:  Recent hospitalization for stroke, aspiration pneumonia, on augmentin therapy Relieved by: breathing treatment. Worsened by:  Movement and exertion Ineffective treatments: antibiotics. Associated symptoms: cough, diaphoresis, fever (starting overnight) and wheezing   Associated symptoms: no abdominal pain, no hemoptysis and no sputum production   Risk factors: no tobacco use   Risk factors comment:  Recent hospitalization   Past Medical History  Diagnosis Date  . Sciatica   . Hypertension   . Enlarged heart   . Phlebitis   . Sleep apnea   . Pneumonia    Past Surgical History  Procedure Laterality Date  . Knee cartilage surgery    . Femur fracture surgery     Family History  Problem Relation Age of Onset  . Stroke Father   . Diabetes Mellitus II Neg Hx    Social History  Substance Use Topics  . Smoking status: Former Smoker    Quit date: 10/27/1992  . Smokeless tobacco: Never Used  . Alcohol Use: Yes     Comment: Pt stated that he drinks scotch and wine on weekends    Review of Systems  Constitutional: Positive for fever (starting overnight) and diaphoresis.  Respiratory: Positive for cough, shortness of breath and wheezing. Negative for hemoptysis and sputum production.   Gastrointestinal: Negative for abdominal pain.  All other systems reviewed and are negative.     Allergies  Ivp dye  Home  Medications   Prior to Admission medications   Medication Sig Start Date End Date Taking? Authorizing Provider  amoxicillin-clavulanate (AUGMENTIN) 875-125 MG tablet Take 1 tablet by mouth 2 (two) times daily. 12/12/15  Yes Elease Etienne, MD  aspirin EC 325 MG tablet Take 1 tablet (325 mg total) by mouth daily. 12/12/15  Yes Elease Etienne, MD  atorvastatin (LIPITOR) 20 MG tablet Take 1 tablet (20 mg total) by mouth daily at 6 PM. 12/12/15  Yes Elease Etienne, MD  ergocalciferol (VITAMIN D2) 50000 UNITS capsule Take 50,000 Units by mouth once a week.   Yes Historical Provider, MD  Oxycodone HCl 10 MG TABS Take 10 mg by mouth 3 (three) times daily.   Yes Historical Provider, MD  pantoprazole (PROTONIX) 40 MG tablet Take 1 tablet (40 mg total) by mouth daily. 12/12/15  Yes Elease Etienne, MD   BP 155/78 mmHg  Pulse 93  Temp(Src) 101.4 F (38.6 C) (Oral)  Resp 24  Ht  (1.803 m)  Wt 266 lb (120.657 kg)  BMI 37.12 kg/m2  SpO2 91% Physical Exam  Constitutional: He is oriented to person, place, and time. He appears well-developed and well-nourished. He appears listless. He has a sickly appearance.  HENT:  Head: Normocephalic and atraumatic.  Eyes: Conjunctivae are normal.  Neck: Neck supple. No tracheal deviation present.  Cardiovascular: Regular rhythm and normal heart sounds.  Tachycardia present.   Pulmonary/Chest: Tachypnea noted. No respiratory distress. He has no wheezes. He has rales (bibasilar).  Abdominal:  Soft. He exhibits no distension.  Neurological: He is oriented to person, place, and time. He has normal strength. He appears listless. No cranial nerve deficit. Coordination normal. GCS eye subscore is 4. GCS verbal subscore is 5. GCS motor subscore is 6.  Skin: Skin is warm. He is diaphoretic.  Psychiatric: He has a normal mood and affect.  Vitals reviewed.   ED Course  Procedures (including critical care time)  CRITICAL CARE Performed by: Lyndal PulleyKnott,  Arshiya Jakes Total critical care time: 30 minutes Critical care time was exclusive of separately billable procedures and treating other patients. Critical care was necessary to treat or prevent imminent or life-threatening deterioration. Critical care was time spent personally by me on the following activities: development of treatment plan with patient and/or surrogate as well as nursing, discussions with consultants, evaluation of patient's response to treatment, examination of patient, obtaining history from patient or surrogate, ordering and performing treatments and interventions, ordering and review of laboratory studies, ordering and review of radiographic studies, pulse oximetry and re-evaluation of patient's condition.   Labs Review Labs Reviewed  COMPREHENSIVE METABOLIC PANEL - Abnormal; Notable for the following:    Glucose, Bld 129 (*)    Calcium 8.6 (*)    Albumin 3.2 (*)    ALT 13 (*)    Anion gap 4 (*)    All other components within normal limits  CBC WITH DIFFERENTIAL/PLATELET - Abnormal; Notable for the following:    WBC 23.2 (*)    Hemoglobin 12.6 (*)    Neutro Abs 20.7 (*)    All other components within normal limits  I-STAT ARTERIAL BLOOD GAS, ED - Abnormal; Notable for the following:    pCO2 arterial 46.3 (*)    pO2, Arterial 66.0 (*)    Bicarbonate 27.4 (*)    All other components within normal limits  CULTURE, BLOOD (ROUTINE X 2)  CULTURE, BLOOD (ROUTINE X 2)  URINE CULTURE  URINALYSIS, ROUTINE W REFLEX MICROSCOPIC (NOT AT [facility])  I-STAT CG4 LACTIC ACID, ED    Imaging Review Dg Chest Port 1 View  12/20/2015  CLINICAL DATA:  Worsening shortness of breath with fever. Currently being treated for pneumonia. EXAM: PORTABLE CHEST 1 VIEW COMPARISON:  Radiographs 12/10/2015 and 06/22/2015. FINDINGS: The heart size and mediastinal contours are stable allowing for slightly lower lung volumes. There has been interval worsening in the right-greater-than-left basilar airspace  opacities. There is no significant pleural effusion. The bones appear unchanged. IMPRESSION: Interval worsening of bibasilar airspace opacities. No consolidation or significant pleural effusion. Electronically Signed   By: Carey BullocksWilliam  Veazey M.D.   On: 12/20/2015 07:14   I have personally reviewed and evaluated these images and lab results as part of my medical decision-making.   EKG Interpretation   Date/Time:  Monday December 20 2015 06:54:32 EST Ventricular Rate:  89 PR Interval:  167 QRS Duration: 107 QT Interval:  354 QTC Calculation: 431 R Axis:   6 Text Interpretation:  Sinus rhythm No significant change since last  tracing Confirmed by Mkayla Steele MD, Cassidi Modesitt ([id number]) on 12/20/2015 7:17:25 AM      MDM   Final diagnoses:  Sepsis, due to unspecified organism (HCC)  HCAP (healthcare-associated pneumonia)    64-year-old male presents with signs of developing HCAP over the last day. He was recently hospitalized for stroke and aspiration pneumonia 10 days ago, was discharged on Augmentin, had follow-up with his primary care physician 3 days ago and was continuing to improve at that time. Has been taking Augmentin and  today was supposed to be his last day of treatment. Chest x-ray is consistent with increased infiltrate and patient with increased work of breathing and new oxygen requirement. BiPap provided PRN for help with oxygenation, pO2 is 66 on ABG.  Patient meets clinical criteria for sepsis with at least 2 SIRS and suspected source of HCAP. Vancomycin and Zosyn administered to broaden antibiotics, multiple fluid boluses administered for sepsis. Hospitalist was consulted for admission and accepted the patient in transfer to facility capable of caring for patient further.  Lyndal Pulley, MD 12/20/15 [phone]

## 2015-12-20 NOTE — ED Notes (Signed)
L Rad Artery x 1 attempt for ABG.  Tolerated well. Bleeding stopped.

## 2015-12-20 NOTE — Progress Notes (Signed)
ANTIBIOTIC CONSULT NOTE - INITIAL  Pharmacy Consult for vancomycin Indication: PNA  Allergies  Allergen Reactions  . Ivp Dye [Iodinated Diagnostic Agents] Other (See Comments)    intolerance    Patient Measurements: Height: 5\' 11"  (180.3 cm) Weight: 264 lb 8.8 oz (120 kg) IBW/kg (Calculated) : 75.3  Vital Signs: Temp: 99.3 F (37.4 C) (12/26 0942) Temp Source: Axillary (12/26 0942) BP: 136/71 mmHg (12/26 0942) Pulse Rate: 79 (12/26 0942) Intake/Output from previous day:   Intake/Output from this shift: Total I/O In: 2100 [I.V.:2100] Out: -   Labs:  Recent Labs  12/20/15 0650  WBC 23.2*  HGB 12.6*  PLT 330  CREATININE 0.88   Estimated Creatinine Clearance: 110.3 mL/min (by C-G formula based on Cr of 0.88). No results for input(s): VANCOTROUGH, VANCOPEAK, VANCORANDOM, GENTTROUGH, GENTPEAK, GENTRANDOM, TOBRATROUGH, TOBRAPEAK, TOBRARND, AMIKACINPEAK, AMIKACINTROU, AMIKACIN in the last 72 hours.   Microbiology: Recent Results (from the past 720 hour(s))  Culture, blood (routine x 2)     Status: None   Collection Time: 12/10/15  4:12 AM  Result Value Ref Range Status   Specimen Description BLOOD RIGHT ARM  Final   Special Requests BOTTLES DRAWN AEROBIC AND ANAEROBIC 5ML  Final   Culture NO GROWTH 5 DAYS  Final   Report Status 12/15/2015 FINAL  Final  Culture, blood (routine x 2)     Status: None   Collection Time: 12/10/15  4:17 AM  Result Value Ref Range Status   Specimen Description BLOOD RIGHT HAND  Final   Special Requests AEROBIC BOTTLE ONLY 5ML  Final   Culture NO GROWTH 5 DAYS  Final   Report Status 12/15/2015 FINAL  Final    Medical History: Past Medical History  Diagnosis Date  . Sciatica   . Hypertension   . Enlarged heart   . Phlebitis   . Sleep apnea   . Pneumonia     Assessment: 64 yo M presents on 12/26 with SOB. CXR shows Interval worsening of bibasilar airspace opacities. Started on cefepime per MD. Pharmacy consulted to start  vancomycin for PNA. Was recently discharged with RLL PNA on Augmentin. Afebrile, WBC elevated at 23.2. SCr stable, normalized CrCl ~4375ml/min. Got a dose of Zosyn and vancomycin in the ED this am.  Goal of Therapy:  Vancomycin trough level 15-20 mcg/ml  Resolution of infection  Plan:  Start vancomycin 750mg  IV Q12 Increase cefepime to 2g IV Q8 per MD Monitor clinical picture, renal function, VT at Css F/U C&S, abx deescalation / LOT

## 2015-12-21 DIAGNOSIS — G4733 Obstructive sleep apnea (adult) (pediatric): Secondary | ICD-10-CM

## 2015-12-21 DIAGNOSIS — I10 Essential (primary) hypertension: Secondary | ICD-10-CM

## 2015-12-21 DIAGNOSIS — F1990 Other psychoactive substance use, unspecified, uncomplicated: Secondary | ICD-10-CM

## 2015-12-21 DIAGNOSIS — J189 Pneumonia, unspecified organism: Secondary | ICD-10-CM

## 2015-12-21 DIAGNOSIS — A419 Sepsis, unspecified organism: Secondary | ICD-10-CM | POA: Diagnosis not present

## 2015-12-21 LAB — CBC
HCT: 36.4 % — ABNORMAL LOW (ref 39.0–52.0)
HEMOGLOBIN: 11.5 g/dL — AB (ref 13.0–17.0)
MCH: 28.2 pg (ref 26.0–34.0)
MCHC: 31.6 g/dL (ref 30.0–36.0)
MCV: 89.2 fL (ref 78.0–100.0)
PLATELETS: 322 10*3/uL (ref 150–400)
RBC: 4.08 MIL/uL — AB (ref 4.22–5.81)
RDW: 14 % (ref 11.5–15.5)
WBC: 21.7 10*3/uL — ABNORMAL HIGH (ref 4.0–10.5)

## 2015-12-21 LAB — BASIC METABOLIC PANEL
Anion gap: 10 (ref 5–15)
BUN: 12 mg/dL (ref 6–20)
CHLORIDE: 107 mmol/L (ref 101–111)
CO2: 27 mmol/L (ref 22–32)
CREATININE: 0.88 mg/dL (ref 0.61–1.24)
Calcium: 9.3 mg/dL (ref 8.9–10.3)
GFR calc Af Amer: >60 mL/min (ref >60)
Glucose, Bld: 116 mg/dL — ABNORMAL HIGH (ref 65–99)
Potassium: 3.9 mmol/L (ref 3.5–5.1)
Sodium: 144 mmol/L (ref 135–145)

## 2015-12-21 LAB — RPR: RPR: NONREACTIVE

## 2015-12-21 LAB — URINE CULTURE: Culture: NO GROWTH

## 2015-12-21 LAB — HIV ANTIBODY (ROUTINE TESTING W REFLEX): HIV Screen 4th Generation wRfx: NONREACTIVE

## 2015-12-21 MED ORDER — ENOXAPARIN SODIUM 60 MG/0.6ML ~~LOC~~ SOLN
60.0000 mg | SUBCUTANEOUS | Status: DC
  Administered 2015-12-21: 60 mg via SUBCUTANEOUS
  Filled 2015-12-21: qty 0.6

## 2015-12-21 MED ORDER — LORAZEPAM 2 MG/ML IJ SOLN
1.0000 mg | Freq: Once | INTRAMUSCULAR | Status: AC
  Administered 2015-12-21: 1 mg via INTRAVENOUS
  Filled 2015-12-21: qty 1

## 2015-12-21 MED ORDER — HYDROCODONE-ACETAMINOPHEN 5-325 MG PO TABS
1.0000 | ORAL_TABLET | ORAL | Status: DC | PRN
  Administered 2015-12-21 – 2015-12-22 (×3): 1 via ORAL
  Filled 2015-12-21 (×3): qty 1

## 2015-12-21 NOTE — Progress Notes (Addendum)
Patient Demographics:    George Frank, is a 64 y.o. male, DOB - 1951-11-06, ZOX:[id number]  Admit date - 12/20/2015   Admitting Physician Edsel PetrinMaryann Mikhail, DO  Outpatient Primary MD for the patient is PARUCHURI,VAMSEE, MD  LOS - 1   Chief Complaint  Patient presents with  . Shortness of Breath        Subjective:    George ErmJames Shaheed today has, No headache, No chest pain, No abdominal pain - No Nausea, No new weakness tingling or numbness, No Cough - SOB. 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 Negative HIV and RPR.    2. Essential hypertension. Not on blood pressure medications will continue to monitor.    3. Recent CVA. Continue aspirin and statin for secondary prevention, PT eval. No acute issues.    4. Cocaine and alcohol abuse. Counseled to quit. On CIWA protocol. No Signs of withdrawal yet.   5. Chronic right-sided back and hip pain. Clearly counseled not to overuse narcotics. Continue supportive care.   6. OSA - does not use CPAP, outpatient pulmonary follow-up and sleep study if needed.    Code Status : Full  Family Communication  : None present  Disposition Plan  : Likely discharge home in 1-2 days  Consults  :  Speech therapy, PT  Procedures  :   DVT Prophylaxis  :  Lovenox    Lab Results  Component Value Date   PLT 322 12/21/2015    Inpatient Medications  Scheduled Meds: . aspirin EC  325 mg Oral Daily  . atorvastatin  20 mg Oral q1800  . ceFEPime (MAXIPIME) IV  2 g Intravenous 3 times per day  .  enoxaparin (LOVENOX) injection  40 mg Subcutaneous Q24H  . folic acid  1 mg Oral Daily  . multivitamin with minerals  1 tablet Oral Daily  . pantoprazole  40 mg Oral Daily  . thiamine  100 mg Oral Daily   Or  . thiamine  100 mg Intravenous Daily  . vancomycin  750 mg Intravenous Q12H   Continuous Infusions: . sodium chloride 75 mL/hr at 12/21/15 0358   PRN Meds:.HYDROcodone-acetaminophen, LORazepam **OR** LORazepam, oxyCODONE, traMADol  Antibiotics  :     Anti-infectives    Start     Dose/Rate Route Frequency Ordered Stop   12/20/15 2100  vancomycin (VANCOCIN) IVPB 750 mg/150 ml premix     750 mg 150 mL/hr over 60 Minutes Intravenous Every 12 hours 12/20/15 1135     12/20/15 1400  ceFEPIme (MAXIPIME) 1 g in dextrose 5 % 50 mL IVPB  Status:  Discontinued     1 g 100 mL/hr over 30 Minutes Intravenous 3 times per day 12/20/15 1118 12/20/15 1135   12/20/15 1200  vancomycin (VANCOCIN) 2,000 mg in sodium chloride 0.9 % 500 mL IVPB  Status:  Discontinued     2,000 mg 250 mL/hr over 120 Minutes Intravenous  Once 12/20/15 1130 12/20/15 1135   12/20/15 1200  ceFEPIme (MAXIPIME) 2 g  in dextrose 5 % 50 mL IVPB     2 g 100 mL/hr over 30 Minutes Intravenous 3 times per day 12/20/15 1135 12/28/15 1159   12/20/15 0732  vancomycin (VANCOCIN) 500 MG powder    Comments:  Cleatrice Burke   : cabinet override      12/20/15 0732 12/20/15 0754   12/20/15 0715  vancomycin (VANCOCIN) 2,000 mg in sodium chloride 0.9 % 500 mL IVPB     2,000 mg 250 mL/hr over 120 Minutes Intravenous  Once 12/20/15 0706 12/20/15 1028   12/20/15 0715  piperacillin-tazobactam (ZOSYN) IVPB 3.375 g     3.375 g 100 mL/hr over 30 Minutes Intravenous  Once 12/20/15 0706 12/20/15 0825        Objective:   Filed Vitals:   12/20/15 1850 12/20/15 2333 12/21/15 0554 12/21/15 0635  BP:  156/80 166/86 155/81  Pulse:  65 59 72  Temp:  98.2 F (36.8 C) 99.6 F (37.6 C)   TempSrc:  Oral Oral   Resp:  18 17   Height:      Weight:       SpO2: 95% 96% 100%     Wt Readings from Last 3 Encounters:  12/20/15 120 kg (264 lb 8.8 oz)  12/10/15 120.657 kg (266 lb)  10/27/14 127.007 kg (280 lb)     Intake/Output Summary (Last 24 hours) at 12/21/15 1152 Last data filed at 12/21/15 0900  Gross per 24 hour  Intake 2281.25 ml  Output   2425 ml  Net -143.75 ml     Physical Exam  Awake Alert, Oriented X 3, No new F.N deficits, Normal affect [place].AT,PERRAL Supple Neck,No JVD, No cervical lymphadenopathy appriciated.  Symmetrical Chest wall movement, Good air movement bilaterally, coarse bilateral breath sounds RRR,No Gallops,Rubs or new Murmurs, No Parasternal Heave +ve B.Sounds, Abd Soft, No tenderness, No organomegaly appriciated, No rebound - guarding or rigidity. No Cyanosis, Clubbing or edema, No new Rash or bruise      Data Review:   Micro Results Recent Results (from the past 240 hour(s))  Blood Culture (routine x 2)     Status: None (Preliminary result)   Collection Time: 12/20/15  6:55 AM  Result Value Ref Range Status   Specimen Description BLOOD HAND LEFT  Final   Special Requests   Final    BOTTLES DRAWN AEROBIC AND ANAEROBIC AER 5cc ANA 5cc   Culture   Final    NO GROWTH < 24 HOURS Performed at [facility]    Report Status PENDING  Incomplete  Blood Culture (routine x 2)     Status: None (Preliminary result)   Collection Time: 12/20/15  7:05 AM  Result Value Ref Range Status   Specimen Description BLOOD LEFT ANTECUBITAL  Final   Special Requests   Final    BOTTLES DRAWN AEROBIC AND ANAEROBIC AER 5cc ANA 5cc   Culture   Final    NO GROWTH < 24 HOURS Performed at [facility]    Report Status PENDING  Incomplete  Urine culture     Status: None   Collection Time: 12/20/15 10:45 AM  Result Value Ref Range Status   Specimen Description URINE, RANDOM  Final   Special Requests NONE  Final   Culture NO GROWTH 1 DAY  Final   Report Status 12/21/2015 FINAL  Final    Radiology  Reports   Dg Chest Port 1 View  12/20/2015  CLINICAL DATA:  Worsening shortness of breath with fever. Currently  being treated for pneumonia. EXAM: PORTABLE CHEST 1 VIEW COMPARISON:  Radiographs 12/10/2015 and 06/22/2015. FINDINGS: The heart size and mediastinal contours are stable allowing for slightly lower lung volumes. There has been interval worsening in the right-greater-than-left basilar airspace opacities. There is no significant pleural effusion. The bones appear unchanged. IMPRESSION: Interval worsening of bibasilar airspace opacities. No consolidation or significant pleural effusion. Electronically Signed   By: Carey Bullocks M.D.   On: 12/20/2015 07:14       CBC  Recent Labs Lab 12/20/15 0650 12/21/15 0953  WBC 23.2* 21.7*  HGB 12.6* 11.5*  HCT 40.3 36.4*  PLT 330 322  MCV 91.6 89.2  MCH 28.6 28.2  MCHC 31.3 31.6  RDW 14.3 14.0  LYMPHSABS 1.4  --   MONOABS 0.8  --   EOSABS 0.3  --   BASOSABS 0.0  --     Chemistries   Recent Labs Lab 12/20/15 0650 12/21/15 0953  NA 135 144  K 4.6 3.9  CL 104 107  CO2 27 27  GLUCOSE 129* 116*  BUN 13 12  CREATININE 0.88 0.88  CALCIUM 8.6* 9.3  AST 23  --   ALT 13*  --   ALKPHOS 62  --   BILITOT 0.5  --    ------------------------------------------------------------------------------------------------------------------ estimated creatinine clearance is 110.3 mL/min (by C-G formula based on Cr of 0.88). ------------------------------------------------------------------------------------------------------------------ No results for input(s): HGBA1C in the last 72 hours. ------------------------------------------------------------------------------------------------------------------ No results for input(s): CHOL, HDL, LDLCALC, TRIG, CHOLHDL, LDLDIRECT in the last 72 hours. ------------------------------------------------------------------------------------------------------------------ No results for input(s): TSH,  T4TOTAL, T3FREE, THYROIDAB in the last 72 hours.  Invalid input(s): FREET3 ------------------------------------------------------------------------------------------------------------------ No results for input(s): VITAMINB12, FOLATE, FERRITIN, TIBC, IRON, RETICCTPCT in the last 72 hours.  Coagulation profile No results for input(s): INR, PROTIME in the last 168 hours.  No results for input(s): DDIMER in the last 72 hours.  Cardiac Enzymes No results for input(s): CKMB, TROPONINI, MYOGLOBIN in the last 168 hours.  Invalid input(s): CK ------------------------------------------------------------------------------------------------------------------ Invalid input(s): POCBNP   Time Spent in minutes  35   SINGH,PRASHANT K M.D on 12/21/2015 at 11:52 AM  Between 7am to 7pm - Pager - [phone]  After 7pm go to [email or web address] - password [facility]  [facility] -  Office  [phone]

## 2015-12-21 NOTE — Care Management Note (Signed)
Case Management Note  Patient Details  Name: George Frank MRN: [id number] Date of Birth: March 27, 1951  Subjective/Objective:                  Date- 12-21-15 Initial Assessment Spoke with patient at the bedside. Introduced self as Sports coachcase manager and explained role in discharge planning and how to be reached.  [place] with [place] mother whom he cares for Verified patient anticipates to go home with family at time of discharge.  Patient has DME walker oxygen through [employer]. Expressed potential need for no other DME.  Patient denied  needing help with their medication.  Patient drives to MD appointments.  Verified patient has PCP Paruchuri. Patient states they currently receive HH services through no one.    Plan: CM will continue to follow [facility] planning and [facility].   George Sabalebbie Tery Hoeger RN BSN CM [phone]   Action/Plan:   Expected Discharge Date:                  Expected Discharge Plan:  Home/Self Care  In-House Referral:     Discharge planning Services  CM Consult  Post Acute Care Choice:    Choice offered to:     DME Arranged:    DME Agency:     HH Arranged:    HH Agency:     Status of Service:  In process, will continue to follow  Medicare Important Message Given:    Date Medicare IM Given:    Medicare IM give by:    Date Additional Medicare IM Given:    Additional Medicare Important Message give by:     If discussed at Long Length of Stay Meetings, dates discussed:    Additional Comments:  George SabalDebbie Payne Garske, RN 12/21/2015, 4:03 PM

## 2015-12-21 NOTE — Evaluation (Signed)
Clinical/Bedside Swallow Evaluation Patient Details  Name: George Frank MRN: [id number] Date of Birth: Sep 19, 1951  Today's Date: 12/21/2015 Time: SLP Start Time (ACUTE ONLY): 0930 SLP Stop Time (ACUTE ONLY): 0950 SLP Time Calculation (min) (ACUTE ONLY): 20 min  Past Medical History:  Past Medical History  Diagnosis Date  . Sciatica   . Hypertension   . Enlarged heart   . Phlebitis   . Sleep apnea   . Kidney stones     "passed them"  . Hyperlipidemia   . DVT (deep venous thrombosis) (HCC) 1990s    LLE  . GERD (gastroesophageal reflux disease)   . Arthritis     "knees" (12/20/2015)  . Sciatica of right side   . Anxiety   . Depression   . Aspiration pneumonia (HCC) 12/10/2015  . HCAP (healthcare-associated pneumonia) 12/20/2015  . Stroke (HCC) 12/10/2015    denies residual on 12/20/2015  . On home oxygen therapy     "2L 24/7 last week; stopped 12/17/2015; dr said I could use it prn; had to use it again 12/19/2015"   Past Surgical History:  Past Surgical History  Procedure Laterality Date  . Knee cartilage surgery Right     scope  . Femur fracture surgery Left 1972  . Fracture surgery    . Tonsillectomy    . Nasal septum surgery  ?2000s   HPI:  64 y.o. male with history of hypertension presently on no medications since we have because of left-sided weakness. Patient's symptoms started on 12/09/15 around 4 PM. At around 8 PM, patient came back from the bathroom and sat on the bed and patient slowly slid to the floor. In the ER, patient was found to have left upper and lower extremity weakness and slurred speech with some facial asymmetry. CT of the head did not show anything acute. On-call neurologist Dr. Noel Christmas was consulted. Patient has been admitted for stroke. Patient states he has not been taking antihypertensives for last 5 years after his blood pressure was found to be improved. Patient also has leukocytosis with mild fever. CXR on 12/20/15 indicated Interval  worsening of bibasilar airspace opacities. No consolidation or significant pleural effusion.  Assessment / Plan / Recommendation Clinical Impression   Pt did not exhibit any overt s/s of aspiration with any consistency, but he did refuse straw during BSE.  Repetitive swallows of thin did not precipitate any further s/s of aspiration during assessment.  OME normal and swallow appeared timely throughout BSE.  Previous BSE on 12/10/15 indicated delayed throat clearing which may be d/t GERD pt experiences intermittently.  Regular/thin recommended and ST will f/u x1 for diet tolerance.     Aspiration Risk  Mild aspiration risk    Diet Recommendation   Regular/thin  Medication Administration: Whole meds with puree    Other  Recommendations Oral Care Recommendations: Oral care BID   Follow up Recommendations  None    Frequency and Duration min 1 x/week  1 week       Prognosis Prognosis for Safe Diet Advancement: Good      Swallow Study   General Date of Onset: 12/10/15 HPI: 64 y.o. male with history of hypertension presently on no medications since we have because of left-sided weakness. Patient's symptoms started last evening around 4 PM. At around 8 PM and patient came back from the bathroom and sat on the bed patient slowly slid to the floor. In the ER patient was found to have left upper and lower extremity weakness and  slurred speech with some facial asymmetry. CT of the head did not show anything acute. On-call neurologist Dr. Noel Christmasharles Stewart was consulted. Patient has been admitted for stroke. Patient states he has not been taking antihypertensives for last 5 years after his blood pressure was found to be improved. Patient also has leukocytosis with mild fever. Has mild cough but chest x-ray and physical exam does not show any definite evidence of pneumonia. Type of Study: Bedside Swallow Evaluation Previous Swallow Assessment: 12/11/15; Regular/thin indicated Diet Prior to this Study:  NPO Temperature Spikes Noted: No Respiratory Status: Nasal cannula History of Recent Intubation: No Behavior/Cognition: Alert;Cooperative Oral Cavity Assessment: Dry Oral Care Completed by SLP: No Oral Cavity - Dentition: Missing dentition Vision: Functional for self-feeding Self-Feeding Abilities: Able to feed self Patient Positioning: Upright in bed Baseline Vocal Quality: Normal Volitional Cough: Strong Volitional Swallow: Able to elicit    Oral/Motor/Sensory Function Overall Oral Motor/Sensory Function: Within functional limits   Ice Chips Ice chips: Within functional limits Presentation: Spoon   Thin Liquid Thin Liquid: Within functional limits Presentation: Cup    Nectar Thick Nectar Thick Liquid: Not tested   Honey Thick Honey Thick Liquid: Not tested   Puree Puree: Within functional limits Presentation: Spoon   Solid Solid: Within functional limits Presentation: Self Fed       Ivy Puryear,PAT, M.S., CCC-SLP 12/21/2015,10:45 AM

## 2015-12-21 NOTE — Progress Notes (Signed)
Pt reports his numbness in rt hand has been getting better. Will monitor closely.

## 2015-12-22 LAB — CBC
HEMATOCRIT: 37.1 % — AB (ref 39.0–52.0)
HEMOGLOBIN: 11.7 g/dL — AB (ref 13.0–17.0)
MCH: 28.4 pg (ref 26.0–34.0)
MCHC: 31.5 g/dL (ref 30.0–36.0)
MCV: 90 fL (ref 78.0–100.0)
Platelets: 321 10*3/uL (ref 150–400)
RBC: 4.12 MIL/uL — ABNORMAL LOW (ref 4.22–5.81)
RDW: 14 % (ref 11.5–15.5)
WBC: 13.8 10*3/uL — ABNORMAL HIGH (ref 4.0–10.5)

## 2015-12-22 MED ORDER — LEVOFLOXACIN 750 MG PO TABS: 750.0000 mg | ORAL_TABLET | Freq: Every day | ORAL | Status: DC

## 2015-12-22 MED ORDER — THIAMINE HCL 100 MG PO TABS: 100.0000 mg | ORAL_TABLET | Freq: Every day | ORAL | Status: DC

## 2015-12-22 NOTE — Care Management Note (Signed)
Case Management Note  Patient Details  Name: George Frank MRN: [id number] Date of Birth: 09/18/1951  Subjective/Objective:                  Date- 12-21-15 Initial Assessment Spoke with patient at the bedside. Introduced self as Sports coachcase manager and explained role in discharge planning and how to be reached.  [place] with [place] mother whom he cares for Verified patient anticipates to go home with family at time of discharge.  Patient has DME walker oxygen through [employer]. Expressed potential need for no other DME.  Patient denied needing help with their medication.  Patient drives to MD appointments.  Verified patient has PCP Paruchuri. Patient states they currently receive HH services through no one.     Action/Plan:  No CM needs identified, DC to home, self care.  Expected Discharge Date:                  Expected Discharge Plan:  Home/Self Care  In-House Referral:     Discharge planning Services  CM Consult  Post Acute Care Choice:    Choice offered to:     DME Arranged:    DME Agency:     HH Arranged:    HH Agency:     Status of Service:  Completed, signed off  Medicare Important Message Given:    Date Medicare IM Given:    Medicare IM give by:    Date Additional Medicare IM Given:    Additional Medicare Important Message give by:     If discussed at Long Length of Stay Meetings, dates discussed:    Additional Comments:  George SabalDebbie George Okey, RN 12/22/2015, 9:05 AM

## 2015-12-22 NOTE — Discharge Instructions (Signed)
Follow with Primary MD PARUCHURI,VAMSEE, MD in 7 days   Get CBC, CMP, 2 view Chest X ray checked  by Primary MD next visit.    Activity: As tolerated with Full fall precautions use walker/cane & assistance as needed   Disposition Home     Diet:   Heart Healthy with feeding assistance and aspiration precautions.  For Heart failure patients - Check your Weight same time everyday, if you gain over 2 pounds, or you develop in leg swelling, experience more shortness of breath or chest pain, call your Primary MD immediately. Follow Cardiac Low Salt Diet and 1.5 lit/day fluid restriction.   On your next visit with your primary care physician please Get Medicines reviewed and adjusted.   Please request your Prim.MD to go over all Hospital Tests and Procedure/Radiological results at the follow up, please get all Hospital records sent to your Prim MD by signing hospital release before you go home.   If you experience worsening of your admission symptoms, develop shortness of breath, life threatening emergency, suicidal or homicidal thoughts you must seek medical attention immediately by calling 911 or calling your MD immediately  if symptoms less severe.  You Must read complete instructions/literature along with all the possible adverse reactions/side effects for all the Medicines you take and that have been prescribed to you. Take any new Medicines after you have completely understood and accpet all the possible adverse reactions/side effects.   Do not drive, operating heavy machinery, perform activities at heights, swimming or participation in water activities or provide baby sitting services if your were admitted for syncope or siezures until you have seen by Primary MD or a Neurologist and advised to do so again.  Do not drive when taking Pain medications.    Do not take more than prescribed Pain, Sleep and Anxiety Medications  Special Instructions: If you have smoked or chewed Tobacco  in  the last 2 yrs please stop smoking, stop any regular Alcohol  and or any Recreational drug use.  Wear Seat belts while driving.   Please note  You were cared for by a hospitalist during your hospital stay. If you have any questions about your discharge medications or the care you received while you were in the hospital after you are discharged, you can call the unit and asked to speak with the hospitalist on call if the hospitalist that took care of you is not available. Once you are discharged, your primary care physician will handle any further medical issues. Please note that NO REFILLS for any discharge medications will be authorized once you are discharged, as it is imperative that you return to your primary care physician (or establish a relationship with a primary care physician if you do not have one) for your aftercare needs so that they can reassess your need for medications and monitor your lab values.
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
## 2015-12-22 NOTE — Discharge Summary (Signed)
George Frank, is a 64 y.o. male  DOB 04-Mar-1951  MRN [id number].  Admission date:  12/20/2015  Admitting Physician  Edsel Petrin, DO  Discharge Date:  12/22/2015   Primary MD  Dennard Schaumann, MD  Recommendations for primary care physician for things to follow:   Check CBC, BMP and a 2 view chest x-ray in 5-7 days. Monitor narcotic and alcohol use.   Admission Diagnosis  HCAP (healthcare-associated pneumonia) [J18.9] Sepsis, due to unspecified organism [employer]) [A41.9]   Discharge Diagnosis  HCAP (healthcare-associated pneumonia) [J18.9] Sepsis, due to unspecified organism (HCC) [A41.9]     Active Problems:   CVA (cerebral infarction)   Essential hypertension   OSA (obstructive sleep apnea)   HCAP (healthcare-associated pneumonia)   Illicit drug use   Pneumonia   Drug abuse      Past Medical History  Diagnosis Date  . Sciatica   . Hypertension   . Enlarged heart   . Phlebitis   . Sleep apnea   . Kidney stones     "passed them"  . Hyperlipidemia   . DVT (deep venous thrombosis) (HCC) 1990s    LLE  . GERD (gastroesophageal reflux disease)   . Arthritis     "knees" (12/20/2015)  . Sciatica of right side   . Anxiety   . Depression   . Aspiration pneumonia (HCC) 12/10/2015  . HCAP (healthcare-associated pneumonia) 12/20/2015  . Stroke (HCC) 12/10/2015    denies residual on 12/20/2015  . On home oxygen therapy     "2L 24/7 last week; stopped 12/17/2015; dr said I could use it prn; had to use it again 12/19/2015"    Past Surgical History  Procedure Laterality Date  . Knee cartilage surgery Right     scope  . Femur fracture surgery Left 1972  . Fracture surgery    . Tonsillectomy    . Nasal septum surgery  ?2000s       HPI  from the history and physical done on the day of admission:    George Frank is a 64 y.o. male with recent hospitalization for CVA and aspiration pneuomonia (RLL), discharged 12/18 on augmentin.Patient saw PCP on Friday, given "something else for PNA". Still taking Augmentin at home. No coughing. No chest pain. Patient presented to [facility] with increasing shortness of breath and fever. CXR showed worsening infiltrates, WBC 23K.Given dose of Vanc and Zosyn  Patient denies any residual weakness from stroke. No longer needs assistive device to walk. He has chronic LLE swelling secondary to remote trauma. He has pain related to sciatica. 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 Negative HIV and RPR. Will be placed on Levaquin for one more week, I have counseled him not to overuse narcotics or alcohol. I suspect that  his aspiration was aided by overuse of narcotics and somnolence. Prior to discharge we will evaluate him for any oxygen need, he says he has home oxygen already which was prescribed last admission. We'll see if he still qualifies.   2. Essential hypertension. Not on blood pressure medications will continue to monitor.    3. Recent CVA. Continue aspirin and statin for secondary prevention, PT eval. No acute issues.    4. Cocaine and alcohol abuse. Counseled to quit. On CIWA protocol. No Signs of withdrawal yet.   5. Chronic right-sided back and hip pain. Clearly counseled not to overuse narcotics. Continue supportive care.   6. OSA - does not use CPAP, outpatient pulmonary follow-up and sleep study if needed.      Discharge Condition: Stable  Follow UP  Follow-up Information    Follow up with PARUCHURI,VAMSEE, MD. Schedule an appointment as soon as  possible for a visit in 3 days.   Specialty:  Internal Medicine       Consults obtained - Speech  Diet and Activity recommendation: See Discharge Instructions below  Discharge Instructions       Discharge Instructions    Diet - low sodium heart healthy    Complete by:  As directed      Discharge instructions    Complete by:  As directed   Follow with Primary MD PARUCHURI,VAMSEE, MD in 7 days   Get CBC, CMP, 2 view Chest X ray checked  by Primary MD next visit.    Activity: As tolerated with Full fall precautions use walker/cane & assistance as needed   Disposition Home     Diet:   Heart Healthy with feeding assistance and aspiration precautions.  For Heart failure patients - Check your Weight same time everyday, if you gain over 2 pounds, or you develop in leg swelling, experience more shortness of breath or chest pain, call your Primary MD immediately. Follow Cardiac Low Salt Diet and 1.5 lit/day fluid restriction.   On your next visit with your primary care physician please Get Medicines reviewed and adjusted.   Please request your Prim.MD to go over all Hospital Tests and Procedure/Radiological results at the follow up, please get all Hospital records sent to your Prim MD by signing hospital release before you go home.   If you experience worsening of your admission symptoms, develop shortness of breath, life threatening emergency, suicidal or homicidal thoughts you must seek medical attention immediately by calling 911 or calling your MD immediately  if symptoms less severe.  You Must read complete instructions/literature along with all the possible adverse reactions/side effects for all the Medicines you take and that have been prescribed to you. Take any new Medicines after you have completely understood and accpet all the possible adverse reactions/side effects.   Do not drive, operating heavy machinery, perform activities at heights, swimming or participation in  water activities or provide baby sitting services if your were admitted for syncope or siezures until you have seen by Primary MD or a Neurologist and advised to do so again.  Do not drive when taking Pain medications.    Do not take more than prescribed Pain, Sleep and Anxiety Medications  Special Instructions: If you have smoked or chewed Tobacco  in the last 2 yrs please stop smoking, stop any regular Alcohol  and or any Recreational drug use.  Wear Seat belts while driving.   Please note  You were cared for by a hospitalist during your hospital stay. If  you have any questions about your discharge medications or the care you received while you were in the hospital after you are discharged, you can call the unit and asked to speak with the hospitalist on call if the hospitalist that took care of you is not available. Once you are discharged, your primary care physician will handle any further medical issues. Please note that NO REFILLS for any discharge medications will be authorized once you are discharged, as it is imperative that you return to your primary care physician (or establish a relationship with a primary care physician if you do not have one) for your aftercare needs so that they can reassess your need for medications and monitor your lab values.     Increase activity slowly    Complete by:  As directed              Discharge Medications       Medication List    STOP taking these medications        doxycycline 100 MG EC tablet  Commonly known as:  DORYX      TAKE these medications        aspirin EC 325 MG tablet  Take 1 tablet (325 mg total) by mouth daily.     atorvastatin 20 MG tablet  Commonly known as:  LIPITOR  Take 1 tablet (20 mg total) by mouth daily at 6 PM.     ergocalciferol 50000 units capsule  Commonly known as:  VITAMIN D2  Take 50,000 Units by mouth once a week. On Mondays     levofloxacin 750 MG tablet  Commonly known as:  LEVAQUIN    Take 1 tablet (750 mg total) by mouth daily.     Oxycodone HCl 20 MG Tabs  Take 20 mg by mouth 2 (two) times daily as needed (severe pain).     pantoprazole 40 MG tablet  Commonly known as:  PROTONIX  Take 1 tablet (40 mg total) by mouth daily.     thiamine 100 MG tablet  Take 1 tablet (100 mg total) by mouth daily.        Major procedures and Radiology Reports - PLEASE review detailed and final reports for all details, in brief -       Dg Chest 2 View  12/10/2015  CLINICAL DATA:  Left-sided weakness in the arm and leg. Slurred speech with asymmetrical smile. Fall. Elevated white cell count. EXAM: CHEST  2 VIEW COMPARISON:  06/22/2015 FINDINGS: Atelectasis in the lung bases. Normal heart size and pulmonary vascularity. No focal airspace disease. No blunting of costophrenic angles. No pneumothorax. Tortuous aorta. Degenerative changes in the spine. IMPRESSION: Atelectasis in the lung bases. Electronically Signed   By: Burman Nieves M.D.   On: 12/10/2015 03:15   Dg Pelvis 1-2 Views  12/10/2015  CLINICAL DATA:  Fall yesterday.  Low back and pelvic pain. EXAM: PELVIS - 1-2 VIEW COMPARISON:  None. FINDINGS: Mild degenerative changes in the hips bilaterally and visualized lower lumbar spine. SI joints are symmetric and unremarkable. No acute bony abnormality. Specifically, no fracture, subluxation, or dislocation. Soft tissues are intact. IMPRESSION: No acute bony abnormality. Electronically Signed   By: Charlett Nose M.D.   On: 12/10/2015 09:29   Ct Head Wo Contrast  12/10/2015  CLINICAL DATA:  Left-sided weakness EXAM: CT HEAD WITHOUT CONTRAST TECHNIQUE: Contiguous axial images were obtained from the base of the skull through the vertex without intravenous contrast. COMPARISON:  None. FINDINGS: Ventricles and sulci  appear symmetrical. Mild ventricular dilatation consistent with mild central atrophy. No mass effect or midline shift. No abnormal extra-axial fluid collections. Gray-white  matter junctions are distinct. Basal cisterns are not effaced. No evidence of acute intracranial hemorrhage. No depressed skull fractures. Retention cyst in the right maxillary antrum. No acute air-fluid levels in the paranasal sinuses. Mastoid air cells are not opacified. IMPRESSION: No acute intracranial abnormalities.  Mild central atrophy. Electronically Signed   By: Burman Nieves M.D.   On: 12/10/2015 02:57   Mr Brain Wo Contrast  12/10/2015  CLINICAL DATA:  Acute onset of symptoms that started 12/09/2015 around 4 p.m. LEFT-sided weakness. Slurred speech. Stroke risk factors include hypertension. EXAM: MRI HEAD WITHOUT CONTRAST MRA HEAD WITHOUT CONTRAST TECHNIQUE: Multiplanar, multiecho pulse sequences of the brain and surrounding structures were obtained without intravenous contrast. Angiographic images of the head were obtained using MRA technique without contrast. COMPARISON:  None. FINDINGS: MRI HEAD FINDINGS The patient had difficulty holding still and the exam was prematurely truncated. Overall the exam is diagnostic for ischemia. There is acute infarction affecting the RIGHT paramedian pons. This extends over an anterior-posterior length of approximately 15 mm, but is only 5 mm thick maximally, near its base. Within limits for assessment due to the truncated exam, there is no associated hemorrhage, mass lesion, or extra-axial fluid. Hydrocephalus ex vacuo representing central atrophy. Minor T2 and FLAIR hyperintensities throughout the white matter, likely chronic microvascular ischemic change. Negative orbits. No acute sinus or mastoid disease. No midline abnormality is evident. MRA HEAD FINDINGS There are dolichoectatic but widely patent BILATERAL internal carotid arteries. Both anterior cerebral arteries are robust. Proximal M1 MCA vessels widely patent. Artifactual signal loss at the BILATERAL MCA trifurcations, as well as the BILATERAL ambient segments PCA, as seen on axial source images from  series 5. Basilar artery shows no irregularity, stenosis, or dissection. Both vertebrals are contributory, LEFT dominant. No cerebellar branch occlusion. Proximal PCA vessels widely patent. No visible intracranial aneurysm. 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 This abnormality is not visible on prior CT. Central atrophy with relatively mild small vessel disease. 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 Electronically Signed   By: Elsie Stain M.D.   On: 12/10/2015 09:25   Dg Chest Port 1 View  12/20/2015  CLINICAL DATA:  Worsening shortness of breath with fever. Currently being treated for pneumonia. EXAM: PORTABLE CHEST 1 VIEW COMPARISON:  Radiographs 12/10/2015 and 06/22/2015. FINDINGS: The heart size and mediastinal contours are stable allowing for slightly lower lung volumes. There has been interval worsening in the right-greater-than-left basilar airspace opacities. There is no significant pleural effusion. The bones appear unchanged. IMPRESSION: Interval worsening of bibasilar airspace opacities. No consolidation or significant pleural effusion. Electronically Signed   By: Carey Bullocks M.D.   On: 12/20/2015 07:14   Dg Chest Port 1 View  12/10/2015  CLINICAL DATA:  Shortness of breath with productive cough EXAM: PORTABLE CHEST 1 VIEW COMPARISON:  December 10, 2015 study obtained earlier in the day and October 27, 2014 FINDINGS: There is patchy airspace opacity in the right base. There is mild scarring in the left base. Lungs elsewhere clear. Heart is borderline prominent with pulmonary vascularity within normal limits. No adenopathy. There is degenerative change in the thoracic spine. IMPRESSION: Patchy infiltrate in the right base. Question early pneumonia versus aspiration. Lungs elsewhere are clear except for mild scarring in the left base which is stable. Stable cardiac prominence. Electronically Signed   By: Chrissie Noa  Margarita Grizzle III M.D.   On: 12/10/2015 10:37   Mr Maxine Glenn Head/brain Wo Cm  12/10/2015  CLINICAL DATA:  Acute onset of symptoms that started 12/09/2015 around 4 p.m. LEFT-sided weakness. Slurred speech. Stroke risk factors include hypertension. EXAM: MRI HEAD WITHOUT CONTRAST MRA HEAD WITHOUT CONTRAST TECHNIQUE: Multiplanar, multiecho pulse sequences of the brain and surrounding structures were obtained without intravenous contrast. Angiographic images of the head were obtained using MRA technique without contrast. COMPARISON:  None. FINDINGS: MRI HEAD FINDINGS The patient had difficulty holding still and the exam was prematurely truncated. Overall the exam is diagnostic for ischemia. There is acute infarction affecting the RIGHT paramedian pons. This extends over an anterior-posterior length of approximately 15 mm, but is only 5 mm thick maximally, near its base. Within limits for assessment due to the truncated exam, there is no associated hemorrhage, mass lesion, or extra-axial fluid. Hydrocephalus ex vacuo representing central atrophy. Minor T2 and FLAIR hyperintensities throughout the white matter, likely chronic microvascular ischemic change. Negative orbits. No acute sinus or mastoid disease. No midline abnormality is evident. MRA HEAD FINDINGS There are dolichoectatic but widely patent BILATERAL internal carotid arteries. Both anterior cerebral arteries are robust. Proximal M1 MCA vessels widely patent. Artifactual signal loss at the BILATERAL MCA trifurcations, as well as the BILATERAL ambient segments PCA, as seen on axial source images from series 5. Basilar artery shows no irregularity, stenosis, or dissection. Both vertebrals are contributory, LEFT dominant. No cerebellar branch occlusion. Proximal PCA vessels widely patent. No visible intracranial aneurysm. 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 This abnormality is not visible on prior CT. Central atrophy with  relatively mild small vessel disease. 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 Electronically Signed   By: Elsie Stain M.D.   On: 12/10/2015 09:25    Micro Results      Recent Results (from the past 240 hour(s))  Blood Culture (routine x 2)     Status: None (Preliminary result)   Collection Time: 12/20/15  6:55 AM  Result Value Ref Range Status   Specimen Description BLOOD HAND LEFT  Final   Special Requests   Final    BOTTLES DRAWN AEROBIC AND ANAEROBIC AER 5cc ANA 5cc   Culture   Final    NO GROWTH < 24 HOURS Performed at [facility]    Report Status PENDING  Incomplete  Blood Culture (routine x 2)     Status: None (Preliminary result)   Collection Time: 12/20/15  7:05 AM  Result Value Ref Range Status   Specimen Description BLOOD LEFT ANTECUBITAL  Final   Special Requests   Final    BOTTLES DRAWN AEROBIC AND ANAEROBIC AER 5cc ANA 5cc   Culture   Final    NO GROWTH < 24 HOURS Performed at [facility]    Report Status PENDING  Incomplete  Urine culture     Status: None   Collection Time: 12/20/15 10:45 AM  Result Value Ref Range Status   Specimen Description URINE, RANDOM  Final   Special Requests NONE  Final   Culture NO GROWTH 1 DAY  Final   Report Status 12/21/2015 FINAL  Final    Today   Subjective    Vita Erm today has no headache,no chest abdominal pain,no new weakness tingling or numbness, feels much better wants to go home today.     Objective   Blood pressure 154/88, pulse 54, temperature 98.1 F (36.7 C), temperature source Oral, resp. rate  18, height 5\' 11"  (1.803 m), weight 120 kg (264 lb 8.8 oz), SpO2 97 %.   Intake/Output Summary (Last 24 hours) at 12/22/15 0855 Last data filed at 12/22/15 0600  Gross per 24 hour  Intake    370 ml  Output   2285 ml  Net  -1915 ml    Exam Awake Alert, Oriented x 3, No new F.N deficits, Normal affect [place].AT,PERRAL Supple Neck,No JVD, No cervical  lymphadenopathy appriciated.  Symmetrical Chest wall movement, Good air movement bilaterally, CTAB RRR,No Gallops,Rubs or new Murmurs, No Parasternal Heave +ve B.Sounds, Abd Soft, Non tender, No organomegaly appriciated, No rebound -guarding or rigidity. No Cyanosis, Clubbing or edema, No new Rash or bruise   Data Review   CBC w Diff: Lab Results  Component Value Date   WBC 13.8* 12/22/2015   HGB 11.7* 12/22/2015   HCT 37.1* 12/22/2015   PLT 321 12/22/2015   LYMPHOPCT 6 12/20/2015   MONOPCT 3 12/20/2015   EOSPCT 1 12/20/2015   BASOPCT 0 12/20/2015    CMP: Lab Results  Component Value Date   NA 144 12/21/2015   K 3.9 12/21/2015   CL 107 12/21/2015   CO2 27 12/21/2015   BUN 12 12/21/2015   CREATININE 0.88 12/21/2015   PROT 7.3 12/20/2015   ALBUMIN 3.2* 12/20/2015   BILITOT 0.5 12/20/2015   ALKPHOS 62 12/20/2015   AST 23 12/20/2015   ALT 13* 12/20/2015  .   Total Time in preparing paper work, data evaluation and todays exam - 35 minutes  Leroy Sea M.D on 12/22/2015 at 8:55 AM  [facility]   Office  [phone]

## 2015-12-22 NOTE — Progress Notes (Signed)
George Frank Belloso to be D/C'd Home per MD order.  Discussed with the patient and all questions fully answered.  VSS, Skin clean, dry and intact without evidence of skin break down, no evidence of skin tears noted. IV catheter discontinued intact. Site without signs and symptoms of complications. Dressing and pressure applied.  An After Visit Summary was printed and given to the patient. Patient received prescription.  D/c education completed with patient/family including follow up instructions, medication list, d/c activities limitations if indicated, with other d/c instructions as indicated by MD - patient able to verbalize understanding, all questions fully answered.   Patient instructed to return to ED, call 911, or call MD for any changes in condition.   Patient escorted via WC, and D/C home via private auto.  Bjorn PippinWenjun N Donn Wilmot 12/22/2015 1:44 PM

## 2015-12-22 NOTE — Progress Notes (Signed)
SATURATION QUALIFICATIONS: (This note is used to comply with regulatory documentation for home oxygen)  Patient Saturations on Room Air at Rest = 96  Patient Saturations on Room Air while Ambulating = 94   

## 2015-12-24 ENCOUNTER — Other Ambulatory Visit: Payer: Self-pay

## 2015-12-24 NOTE — Patient Outreach (Signed)
[facility]) Care Management  12/24/2015  Vita ErmJames Siordia 1951-10-24 [id number]   Telephone call to patient regarding EMMI stroke red referral. Unable to reach patient. HIPAA compliant voice message left with call back phone number.   PLAN:  RNCM will attempt 2nd telephone outreach within 1 week.   George InaDavina Altheria Shadoan RN,BSN,CCM [facility] Telephonic Care Coordinator [phone]

## 2015-12-25 LAB — CULTURE, BLOOD (ROUTINE X 2)
CULTURE: NO GROWTH
Culture: NO GROWTH

## 2015-12-28 ENCOUNTER — Ambulatory Visit: Payer: Self-pay

## 2015-12-29 ENCOUNTER — Other Ambulatory Visit: Payer: Self-pay

## 2015-12-30 ENCOUNTER — Ambulatory Visit: Payer: Self-pay

## 2015-12-31 NOTE — Patient Outreach (Addendum)
[facility]) Care Management  12/31/2015  George ErmJames Frank 12/20/1951 [id number]   Late entry: 12/29/15  SUBJECTIVE:  Telephone call to patient for EMMI stroke RED referral.  HIPAA verified with patient.  Patient states he was recently discharged from the hospital on 12/22/15 with pneumonia.  Patient states he was in the hospital from 12/10/15 to 12/12/15 due to a stroke.  Patient states he had pneumonia at that time but it did not get better so he was readmitted to the hospital 12/20/15.  Patient states he is still taking antibiotic levaquin.  Patient states he has all of his medications and is able to afford them at this time.  Patient states he followed up with his primary MD after the stroke and now has a follow up visit with priimary MD on 01/07/16. Patient states he is scheduled to see Dr. Pearlean BrownieSethi, neurologist February 21, 2016.  Patient states he is able to drive himself to his appointments. Patient states he did have slurred speech and some drooping in his face after the stroke but this has resolved.  Patient states he does not have to use any assistive device for walking. Patient states he has a good support system with his two daughters that live in the area.  Patient states he is still feeling weak due to the stroke and pneumonia.  Patient states he tried to do to much activity after being discharged from the hospital but states he has now slowed down and is resting more.  Patient states he lives with his elderly mother who he cares for.   RNCM reviewed with patient signs and symptoms of stroke. Advised patient to contact 911 for these symptoms. Patient states he is aware of how to contact his doctor after hours for any other symptoms if needed.  RNCM reviewed with patient signs and symptoms of pneumonia and advised patient to contact doctor for these symptoms. Patient verbalized understanding.  RNCM advised patient to take his medications as prescribed. Advised patient to adhere to low  salt, heart healthy diet.   RNCM advised patient to keep follow up appointments with doctor. RNCM advised patient to rest as much as possible and only increase activity level as tolerated. Advised patient to discuss activity level with doctor at next visit. Patient verbalized understanding.   ASSESSMENT: EMMI stroke transition program.   PLAN; RNCM will follow up with patient within 1 week. RNCM will send patient EMMI education material on pneumonia, stroke, low sodium diet. Patient will discuss outcome of follow up appointment with doctor within 2 weeks.  George InaDavina Zacari Stiff RN,BSN,CCM [facility] Telephonic Care Coordinator [phone]

## 2016-01-06 ENCOUNTER — Other Ambulatory Visit: Payer: Self-pay

## 2016-01-06 NOTE — Patient Outreach (Signed)
[facility]) Care Management  01/06/2016  George Frank 1951-10-21 [id number]  Telephone call to patient regarding EMMI stroke program follow up.  Unable to reach patient. HIPAA compliant voice message left with call back phone number.   PLAN; RNCM will attempt 2nd telephone outreach to patient within 1 week.   George InaDavina Joei Frangos RN,BSN,CCM [facility] Telephonic Care Coordinator [phone]

## 2016-01-07 ENCOUNTER — Other Ambulatory Visit: Payer: Self-pay

## 2016-01-07 NOTE — Patient Outreach (Signed)
[facility]) Care Management  01/07/2016  George Frank 05/23/1951 [id number]  Second telephone call to patient regarding EMMI stroke program follow up.  Unable to reach patient. HIPAA compliant voice message left with call back phone number.   PLAN; RNCM will attempt 3rd telephone outreach to patient within 1 week.   George InaDavina Maccoy Haubner RN,BSN,CCM [facility] Telephonic Care Coordinator [phone]

## 2016-01-11 ENCOUNTER — Other Ambulatory Visit: Payer: Self-pay

## 2016-01-11 NOTE — Patient Outreach (Signed)
[facility]) Care Management  01/11/2016  George Frank [birth year] [id number]  Third telephone call to patient regarding EMMI stroke program follow up. Unable to reach patient HIPAA compliant voice message left with call back phone number   PLAN:  RNCM will send patient outreach letter to attempt contact.   George Ina RN,BSN,CCM [facility] Telephonic Care Coordinator [phone]

## 2016-01-25 ENCOUNTER — Other Ambulatory Visit: Payer: Self-pay

## 2016-01-25 NOTE — Patient Outreach (Signed)
[facility]) Care Management  01/25/2016  George Frank 02/08/1951 [id number]   No response from patient after 3 telephone calls and letter outreach.   PLAN; RNCM will refer patient to Nena Polio to close due to inability to re-establish contact with patient. RNCM will notify Dr. Pearlean Brownie of closure and inability to re-establish contact with patient.   George Ina RN,BSN,CCM [facility] Telephonic  [phone]

## 2016-02-21 ENCOUNTER — Ambulatory Visit (INDEPENDENT_AMBULATORY_CARE_PROVIDER_SITE_OTHER): Payer: Medicare Other | Admitting: Neurology

## 2016-02-21 ENCOUNTER — Encounter: Payer: Self-pay | Admitting: Neurology

## 2016-02-21 VITALS — BP 151/97 | HR 66 | Ht 71.0 in | Wt 259.4 lb

## 2016-02-21 DIAGNOSIS — I639 Cerebral infarction, unspecified: Secondary | ICD-10-CM | POA: Diagnosis not present

## 2016-02-21 DIAGNOSIS — I6381 Other cerebral infarction due to occlusion or stenosis of small artery: Secondary | ICD-10-CM

## 2016-02-21 NOTE — Patient Instructions (Signed)
I had a long d/w patient about his recent stroke, risk for recurrent stroke/TIAs, personally independently reviewed imaging studies and stroke evaluation results and answered questions.Continue aspirin 325 mg daily  for secondary stroke prevention and maintain strict control of hypertension with blood pressure goal below 130/90, diabetes with hemoglobin A1c goal below 6.5% and lipids with LDL cholesterol goal below 70 mg/dL. I also advised the patient to eat a healthy diet with plenty of whole grains, cereals, fruits and vegetables, exercise regularly and maintain ideal body weight Followup in the future with stroke NP in 6 months or call earlier if necessary  Stroke Prevention Some medical conditions and behaviors are associated with an increased chance of having a stroke. You may prevent a stroke by making healthy choices and managing medical conditions. HOW CAN I REDUCE MY RISK OF HAVING A STROKE?   Stay physically active. Get at least 30 minutes of activity on most or all days.  Do not smoke. It may also be helpful to avoid exposure to secondhand smoke.  Limit alcohol use. Moderate alcohol use is considered to be:  No more than 2 drinks per day for men.  No more than 1 drink per day for nonpregnant women.  Eat healthy foods. This involves:  Eating 5 or more servings of fruits and vegetables a day.  Making dietary changes that address high blood pressure (hypertension), high cholesterol, diabetes, or obesity.  Manage your cholesterol levels.  Making food choices that are high in fiber and low in saturated fat, trans fat, and cholesterol may control cholesterol levels.  Take any prescribed medicines to control cholesterol as directed by your health care provider.  Manage your diabetes.  Controlling your carbohydrate and sugar intake is recommended to manage diabetes.  Take any prescribed medicines to control diabetes as directed by your health care provider.  Control your  hypertension.  Making food choices that are low in salt (sodium), saturated fat, trans fat, and cholesterol is recommended to manage hypertension.  Ask your health care provider if you need treatment to lower your blood pressure. Take any prescribed medicines to control hypertension as directed by your health care provider.  If you are 18-39 years of age, have your blood pressure checked every 3-5 years. If you are 40 years of age or older, have your blood pressure checked every year.  Maintain a healthy weight.  Reducing calorie intake and making food choices that are low in sodium, saturated fat, trans fat, and cholesterol are recommended to manage weight.  Stop drug abuse.  Avoid taking birth control pills.  Talk to your health care provider about the risks of taking birth control pills if you are over 35 years old, smoke, get migraines, or have ever had a blood clot.  Get evaluated for sleep disorders (sleep apnea).  Talk to your health care provider about getting a sleep evaluation if you snore a lot or have excessive sleepiness.  Take medicines only as directed by your health care provider.  For some people, aspirin or blood thinners (anticoagulants) are helpful in reducing the risk of forming abnormal blood clots that can lead to stroke. If you have the irregular heart rhythm of atrial fibrillation, you should be on a blood thinner unless there is a good reason you cannot take them.  Understand all your medicine instructions.  Make sure that other conditions (such as anemia or atherosclerosis) are addressed. SEEK IMMEDIATE MEDICAL CARE IF:   You have sudden weakness or numbness of the face,   arm, or leg, especially on one side of the body.  Your face or eyelid droops to one side.  You have sudden confusion.  You have trouble speaking (aphasia) or understanding.  You have sudden trouble seeing in one or both eyes.  You have sudden trouble walking.  You have  dizziness.  You have a loss of balance or coordination.  You have a sudden, severe headache with no known cause.  You have new chest pain or an irregular heartbeat. Any of these symptoms may represent a serious problem that is an emergency. Do not wait to see if the symptoms will go away. Get medical help at once. Call your local emergency services (911 in [place] Do not drive yourself to the hospital.   This information is not intended to replace advice given to you by your health care provider. Make sure you discuss any questions you have with your health care provider.   Document Released: 01/18/2005 Document Revised: 01/01/2015 Document Reviewed: 06/13/2013 Elsevier Interactive Patient Education 2016 Elsevier Inc.   

## 2016-02-21 NOTE — Progress Notes (Signed)
[facility] [address] [place]. [place] [address] [phone]       OFFICE FOLLOW-UP NOTE  Mr. George Frank Date of Birth:  March 21, 1951 Medical Record Number:  [id number]   HPI: Mr. George Frank is a 65 year old male seen today for first office follow-up visit for hospital admission for stroke in December 2016. [name] is a 65 y.o. male history of hypertension, cardiac enlargement, sleep apnea and sciatica, presenting with new onset weakness involving left face arm and leg. In terms started at 4 PM on 12/09/2015. He has no previous history of stroke nor TIA. He has not been on antiplatelet therapy. CT scan of his head showed no acute intracranial abnormality. NIH stroke score was 4. LSN: 1 PM on 12/09/2015 tPA Given: No: Beyond time window for treatment consideration.  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 MRA of the brain showed no significant large vessel stenosis. Transthoracic echo showed normal ejection fraction without cardiac source of embolism. LDL cholesterol was 80 mg percent and hemoglobin A1c was 6.2. Urine drug screen was positive for cocaine. Carotid Doppler showed no significant extracranial stenosis. Patient was started on aspirin for stroke prevention. He states his done well since discharge. He has slightly diminished fine motor skills on the left and left leg occasionally main fragment is tired but speech is back to normal he feels subjectively is made full recovery. He states his blood pressure is quite good at home though he has some whitecoat hypertension and blood pressure is elevated today at 151/97 in office. The patient is tolerating Lipitor well without any side effects. He is had some trouble swallowing in the past and has had recurrent bouts of pneumonia. Patient states his walking is limited mainly because of his arthritis in the knees and he takes oxycodone daily and  takes shy and gabapentin for sciatica. Patient has been diagnosed with sleep apnea in the past but he was not able to tolerate the CPAP mask and did not complete his titration. ROS:   14 system review of systems is positive for dysarthria, and weakness, silent aspiration, pneumonia and all other systems negative PMH:  Past Medical History  Diagnosis Date  . Sciatica   . Hypertension   . Enlarged heart   . Phlebitis   . Sleep apnea   . Kidney stones     "passed them"  . Hyperlipidemia   . DVT (deep venous thrombosis) (HCC) 1990s    LLE  . GERD (gastroesophageal reflux disease)   . Arthritis     "knees" (12/20/2015)  . Sciatica of right side   . Anxiety   . Depression   . Aspiration pneumonia (HCC) 12/10/2015  . HCAP (healthcare-associated pneumonia) 12/20/2015  . Stroke (HCC) 12/10/2015    denies residual on 12/20/2015  . On home oxygen therapy     "2L 24/7 last week; stopped 12/17/2015; dr said I could use it prn; had to use it again 12/19/2015"    Social History:  Social History   Social History  . Marital Status: Divorced    Spouse Name: N/A  . Number of Children: N/A  . Years of Education: N/A   Occupational History  . Not on file.   Social History Main Topics  . Smoking status: Former Smoker -- 3.00 packs/day for 23 years    Types: Cigarettes    Quit date: 10/27/1992  . Smokeless tobacco: Never Used  . Alcohol  Use: 0.6 oz/week    1 Cans of beer per week     Comment: 12/20/2015 "I'll have a few drinks once/month"  . Drug Use: Yes    Special: Cocaine     Comment: 12/20/2015 "at parties about 3-4 times a year"  . Sexual Activity: Not Currently   Other Topics Concern  . Not on file   Social History Narrative    Medications:   Current Outpatient Prescriptions on File Prior to Visit  Medication Sig Dispense Refill  . aspirin EC 325 MG tablet Take 1 tablet (325 mg total) by mouth daily. 30 tablet 0  . atorvastatin (LIPITOR) 20 MG tablet Take 1 tablet (20  mg total) by mouth daily at 6 PM. 30 tablet 0  . ergocalciferol (VITAMIN D2) 50000 UNITS capsule Take 50,000 Units by mouth once a week. On Mondays    . Oxycodone HCl 20 MG TABS Take 10 mg by mouth 4 (four) times daily as needed (severe pain).     . pantoprazole (PROTONIX) 40 MG tablet Take 1 tablet (40 mg total) by mouth daily. 30 tablet 0  . thiamine 100 MG tablet Take 1 tablet (100 mg total) by mouth daily. 30 tablet 0   No current facility-administered medications on file prior to visit.    Allergies:   Allergies  Allergen Reactions  . Ivp Dye [Iodinated Diagnostic Agents] Other (See Comments)    Pt reports serious reaction in 1972 at [facility]    Physical Exam General: well developed, well nourished, seated, in no evident distress Head: head normocephalic and atraumatic.  Neck: supple with no carotid or supraclavicular bruits Cardiovascular: regular rate and rhythm, no murmurs Musculoskeletal: no deformity Skin:  no rash/petichiae Vascular:  Normal pulses all extremities Filed Vitals:   02/21/16 1342  BP: 151/97  Pulse: 66   Neurologic Exam Mental Status: Awake and fully alert. Oriented to place and time. Recent and remote memory intact. Attention span, concentration and fund of knowledge appropriate. Mood and affect appropriate.  Cranial Nerves: Fundoscopic exam reveals sharp disc margins. Pupils equal, briskly reactive to light. Extraocular movements full without nystagmus. Visual fields full to confrontation. Hearing intact. Facial sensation intact. Face, tongue, palate moves normally and symmetrically.  Motor: Normal bulk and tone. Normal strength in all tested extremity muscles.Diminished fine finger movements on the left. Orbits right over left upper extremity.  Sensory.: intact to touch ,pinprick .position and vibratory sensation.  Coordination: Rapid alternating movements normal in all extremities. Finger-to-nose and heel-to-shin performed accurately  bilaterally. Gait and Station: Arises from chair without difficulty. Stance is normal. Gait demonstrates normal stride length and balance . Able to heel, toe and tandem walk with mild  difficulty.  Reflexes: 1+ and symmetric. Toes downgoing.   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 he was also counseled to quit cocaine abuse. I also advised the patient to eat a healthy diet with plenty of whole grains,  cereals, fruits and vegetables, exercise regularly and maintain ideal body weightGreater than 50% of time during this 25 minute visit was spent on counseling,explanation of diagnosis, planning of further management, discussion with patient and family and coordination of care . Followup in the future with stroke NP in 6 months or call earlier if necessary Delia Heady, M.D.  Note: This document was prepared with digital dictation and possible smart phrase technology. Any transcriptional errors that result from this process are unintentional

## 2016-08-23 ENCOUNTER — Ambulatory Visit (INDEPENDENT_AMBULATORY_CARE_PROVIDER_SITE_OTHER): Payer: Medicare Other | Admitting: Nurse Practitioner

## 2016-08-23 ENCOUNTER — Encounter: Payer: Self-pay | Admitting: Nurse Practitioner

## 2016-08-23 VITALS — BP 139/81 | HR 74 | Ht 71.0 in | Wt 287.8 lb

## 2016-08-23 DIAGNOSIS — G4733 Obstructive sleep apnea (adult) (pediatric): Secondary | ICD-10-CM

## 2016-08-23 DIAGNOSIS — I10 Essential (primary) hypertension: Secondary | ICD-10-CM

## 2016-08-23 DIAGNOSIS — I639 Cerebral infarction, unspecified: Secondary | ICD-10-CM | POA: Diagnosis not present

## 2016-08-23 NOTE — Progress Notes (Signed)
[facility]  PATIENT: George ErmJames Frank DOB: 06/10/1951   REASON FOR VISIT: Follow-up for stroke, right pontine infarct HISTORY FROM: Patient    HISTORY OF PRESENT ILLNESS: UPDATE 08/23/16 CMMr. George Frank, 65 year old male returns for follow-up. He had hospital admission for stroke in December 2016. He returns today for follow-up without recurrent stroke or TIA symptoms. He is currently on aspirin 325 daily without significant bruising. He is on Lipitor for his cholesterol. He does little exercise due to bilateral knee pain from arthritis. He occasionally ambulates with a walker. Blood pressure 139/81 in the office today. HCTZ has been added since last seen. He has a history of obstructive sleep apnea but never went back for a titration. He states the test was performed greater than 5 years ago. His PCP had set up for him to be retested prior to his stroke. He was made aware  this ia a risk factor for stroke he needs to get rescheduled. 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 He is on hydrocodone and gabapentin for sciatica. He returns for reevaluation    HISTORY: 02/21/16 PSMr. George Frank is a 65 year old male seen today for first office follow-up visit for hospital admission for stroke in December 2016. George Frank is a 65 y.o. male history of hypertension, cardiac enlargement, sleep apnea and sciatica, presenting with new onset weakness involving left face arm and leg. In terms started at 4 PM on 12/09/2015. He has no previous history of stroke nor TIA. He has not been on antiplatelet therapy. CT scan of his head showed no acute intracranial abnormality. NIH stroke score was 4. LSN: 1 PM on 12/09/2015 tPA Given: No: Beyond time window for treatment consideration.  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 MRA of the brain showed no significant large vessel stenosis. Transthoracic echo showed normal ejection  fraction without cardiac source of embolism. LDL cholesterol was 80 mg percent and hemoglobin A1c was 6.2. Urine drug screen was positive for cocaine. Carotid Doppler showed no significant extracranial stenosis. Patient was started on aspirin for stroke prevention. He states his done well since discharge. He has slightly diminished fine motor skills on the left and left leg occasionally main fragment is tired but speech is back to normal he feels subjectively is made full recovery. He states his blood pressure is quite good at home though he has some whitecoat hypertension and blood pressure is elevated today at 151/97 in office. The patient is tolerating Lipitor well without any side effects. He is had some trouble swallowing in the past and has had recurrent bouts of pneumonia. Patient states his walking is limited mainly because of his arthritis in the knees and he takes oxycodone daily and  gabapentin for sciatica. Patient has been diagnosed with sleep apnea in the past but he was not able to tolerate the CPAP mask and did not complete his titration  REVIEW OF SYSTEMS: Full 14 system review of systems performed and notable only for those listed, all others are neg:  Constitutional: neg  Cardiovascular: neg Ear/Nose/Throat: neg  Skin: neg Eyes: neg Respiratory: neg Gastroitestinal: neg  Hematology/Lymphatic: neg  Endocrine: neg Musculoskeletal: Joint pain, walking difficulty, occasional muscle cramps Allergy/Immunology: neg Neurological: neg Psychiatric: neg Sleep : History of obstructive sleep apnea   ALLERGIES: Allergies  Allergen Reactions  . Ivp Dye [Iodinated Diagnostic Agents] Other (Frank Comments)    [facility]  HOME MEDICATIONS: Outpatient Medications Prior to Visit  Medication Sig Dispense Refill  . aspirin EC 325 MG tablet Take 1 tablet (325 mg total) by mouth daily. 30 tablet 0  . atorvastatin (LIPITOR) 20 MG tablet Take 1  tablet (20 mg total) by mouth daily at 6 PM. 30 tablet 0  . gabapentin (NEURONTIN) 300 MG capsule Take 300 mg by mouth 2 (two) times daily.     Marland Kitchen lisinopril (PRINIVIL,ZESTRIL) 20 MG tablet Take 20 mg by mouth daily.    . Oxycodone HCl 20 MG TABS Take 10 mg by mouth 4 (four) times daily as needed (severe pain).     . pantoprazole (PROTONIX) 40 MG tablet Take 1 tablet (40 mg total) by mouth daily. 30 tablet 0  . acetaminophen (TYLENOL) 500 MG tablet Take 500 mg by mouth every 6 (six) hours as needed.    . doxycycline (DORYX) 100 MG EC tablet Take 100 mg by mouth 2 (two) times daily.    . ergocalciferol (VITAMIN D2) 50000 UNITS capsule Take 50,000 Units by mouth once a week. On Mondays    . thiamine 100 MG tablet Take 1 tablet (100 mg total) by mouth daily. (Patient not taking: Reported on 08/23/2016) 30 tablet 0   No facility-administered medications prior to visit.     PAST MEDICAL HISTORY: Past Medical History:  Diagnosis Date  . Anxiety   . Arthritis    "knees" (12/20/2015)  . Aspiration pneumonia (HCC) 12/10/2015  . Depression   . DVT (deep venous thrombosis) (HCC) 1990s   LLE  . Enlarged heart   . GERD (gastroesophageal reflux disease)   . HCAP (healthcare-associated pneumonia) 12/20/2015  . Hyperlipidemia   . Hypertension   . Kidney stones    "passed them"  . On home oxygen therapy    "2L 24/7 last week; stopped 12/17/2015; dr said I could use it prn; had to use it again 12/19/2015"  . Phlebitis   . Sciatica   . Sciatica of right side   . Sleep apnea   . Stroke (HCC) 12/10/2015   denies residual on 12/20/2015    PAST SURGICAL HISTORY: Past Surgical History:  Procedure Laterality Date  . FEMUR FRACTURE SURGERY Left 1972  . FRACTURE SURGERY    . KNEE CARTILAGE SURGERY Right    scope  . NASAL SEPTUM SURGERY  ?2000s  . TONSILLECTOMY      FAMILY HISTORY: Family History  Problem Relation Age of Onset  . Stroke Father   . Diabetes Mellitus II Neg Hx     SOCIAL  HISTORY: Social History   Social History  . Marital status: Divorced    Spouse name: N/A  . Number of children: N/A  . Years of education: N/A   Occupational History  . Not on file.   Social History Main Topics  . Smoking status: Former Smoker    Packs/day: 3.00    Years: 23.00    Types: Cigarettes    Quit date: 10/27/1992  . Smokeless tobacco: Never Used  . Alcohol use 0.6 oz/week    1 Cans of beer per week     Comment: 12/20/2015 "I'll have a few drinks once/month"  . Drug use:     Types: Cocaine     Comment: 12/20/2015 "at parties about 3-4 times a year"  . Sexual activity: Not Currently   Other Topics Concern  . Not on file   Social History Narrative  . No narrative on file     PHYSICAL  EXAM  Vitals:   08/23/16 0909  BP: 139/81  Pulse: 74  Weight: 287 lb 12.8 oz (130.5 kg)  Height: 5\' 11"  (1.803 m)   Body mass index is 40.14 kg/m. General: well developed, well nourished, Obese male seated, in no evident distress Head: head normocephalic and atraumatic.  Neck: supple with no carotid  bruits Cardiovascular: regular rate and rhythm, no murmurs Musculoskeletal: no deformity Skin:  no rash/petichiae Vascular:  Normal pulses all extremities Neurological examination  Mental Status: Awake and fully alert. Oriented to place and time. Recent and remote memory intact. Attention span, concentration and fund of knowledge appropriate. Mood and affect appropriate.  Cranial Nerves: Fundoscopic exam not done. Pupils equal, briskly reactive to light. Extraocular movements full without nystagmus. Visual fields full to confrontation. Hearing intact. Facial sensation intact. Face, tongue, palate moves normally and symmetrically.  Motor: Normal bulk and tone. Normal strength in all tested extremity muscles.Diminished fine finger movements on the left. Orbits right over left upper extremity.  Sensory.: intact to touch ,pinprick .position and vibratory sensation.  Coordination:  Rapid alternating movements normal in all extremities. Finger-to-nose and heel-to-shin performed accurately bilaterally. Gait and Station: Arises from chair without difficulty. Stance is normal. Gait demonstrates normal stride length and balance . Able to heel, toe and tandem walk with mild  difficulty. No assistive device Reflexes: 1+ and symmetric. Toes downgoing.   DIAGNOSTIC DATA (LABS, IMAGING, TESTING) - I reviewed patient records, labs, notes, testing and imaging myself where available.  Lab Results  Component Value Date   WBC 13.8 (H) 12/22/2015   HGB 11.7 (L) 12/22/2015   HCT 37.1 (L) 12/22/2015   MCV 90.0 12/22/2015   PLT 321 12/22/2015      Component Value Date/Time   NA 144 12/21/2015 0953   K 3.9 12/21/2015 0953   CL 107 12/21/2015 0953   CO2 27 12/21/2015 0953   GLUCOSE 116 (H) 12/21/2015 0953   BUN 12 12/21/2015 0953   CREATININE 0.88 12/21/2015 0953   CALCIUM 9.3 12/21/2015 0953   PROT 7.3 12/20/2015 0650   ALBUMIN 3.2 (L) 12/20/2015 0650   AST 23 12/20/2015 0650   ALT 13 (L) 12/20/2015 0650   ALKPHOS 62 12/20/2015 0650   BILITOT 0.5 12/20/2015 0650   GFRNONAA >60 12/21/2015 0953   GFRAA >60 12/21/2015 0953   Lab Results  Component Value Date   CHOL 123 12/10/2015   HDL 27 (L) 12/10/2015   LDLCALC 80 12/10/2015   TRIG 81 12/10/2015   CHOLHDL 4.6 12/10/2015   Lab Results  Component Value Date   HGBA1C 6.2 (H) 12/10/2015    ASSESSMENT AND PLAN 64 year Patient with a 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 Continue Lipitor Advised the patient to eat a healthy diet  with plenty of whole grains, cereals, fruits and vegetables, exercise regularly and maintain ideal body  Need to obtain repeat sleep study had been set up at PCP office Patient denies current cocaine abuse F/U in 6 months Nilda Riggs, [facility], [facility], APRN  [facility] [address], [address] [place], [place] [address] [phone]

## 2016-08-23 NOTE — Patient Instructions (Signed)
Continue aspirin 325 mg daily  for secondary stroke prevention maintain strict control of hypertension with blood pressure goal below 130/90,todays reading 139/81  diabetes with hemoglobin A1c goal below 6.5%  lipids with LDL cholesterol goal below 70 mg/dL. Continue Lipitor Advised the patient to eat a healthy diet with plenty of whole grains, cereals, fruits and vegetables, exercise regularly and maintain ideal body  Need to obtain repeat sleep study had been set up at PCP office F/U in 6 months

## 2016-08-24 NOTE — Progress Notes (Signed)
I agree with the above plan 

## 2017-02-23 ENCOUNTER — Encounter: Payer: Self-pay | Admitting: Nurse Practitioner

## 2017-02-23 ENCOUNTER — Ambulatory Visit (INDEPENDENT_AMBULATORY_CARE_PROVIDER_SITE_OTHER): Payer: Medicare Other | Admitting: Nurse Practitioner

## 2017-02-23 VITALS — BP 129/83 | HR 75 | Wt 291.6 lb

## 2017-02-23 DIAGNOSIS — E785 Hyperlipidemia, unspecified: Secondary | ICD-10-CM | POA: Diagnosis not present

## 2017-02-23 DIAGNOSIS — I10 Essential (primary) hypertension: Secondary | ICD-10-CM | POA: Diagnosis not present

## 2017-02-23 DIAGNOSIS — G4733 Obstructive sleep apnea (adult) (pediatric): Secondary | ICD-10-CM

## 2017-02-23 DIAGNOSIS — E1149 Type 2 diabetes mellitus with other diabetic neurological complication: Secondary | ICD-10-CM | POA: Insufficient documentation

## 2017-02-23 DIAGNOSIS — E119 Type 2 diabetes mellitus without complications: Secondary | ICD-10-CM | POA: Insufficient documentation

## 2017-02-23 DIAGNOSIS — I639 Cerebral infarction, unspecified: Secondary | ICD-10-CM

## 2017-02-23 DIAGNOSIS — E782 Mixed hyperlipidemia: Secondary | ICD-10-CM | POA: Insufficient documentation

## 2017-02-23 NOTE — Patient Instructions (Signed)
Continued management of stroke risk factors Keep systolic blood pressure less than 130, today's reading 129/83 Lipids are followed by PCP  Keep LDL less than 70 continue Lipitor Carotid Doppler just repeated by PCP per patient Continue aspirin 325mg  daily for secondary stroke prevention diabetes with hemoglobin A1c goal below 6.5%  continue metformin No further stroke or TIA symptoms since December 2016 Advised the patient to eat a healthy diet with plenty of whole grains, cereals, fruits and vegetables, exercise regularly and maintain ideal body  Need to obtain repeat sleep study had been set up at PCP office If recurrent stroke symptoms occur, call 911 and proceed to the hospital Discharge from neurologic services at this time REMEMBER Pneumonic FAST which stands for  F stands  for face drooping and weakness etc. A stands for arms, weakness S stands for speech slurred  T stands for time to call 911

## 2017-02-23 NOTE — Progress Notes (Signed)
I agree with the above plan 

## 2017-02-23 NOTE — Progress Notes (Signed)
[facility]  PATIENT: Vita ErmJames Miera DOB: 1951/08/12   REASON FOR VISIT: Follow-up for stroke, right pontine infarct HISTORY FROM: Patient    HISTORY OF PRESENT ILLNESS: UPDATE 03/02/2018CM Mr. 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 He has risk factors of hypertension hyperlipidemia newly diagnosed diabetes and sleep apnea. He is currently on aspirin for secondary stroke prevention without recurrent stroke or TIA symptoms. He has no bruising and bleeding. In addition he is on Lipitor for hyperlipidemia and his labs are followed by his primary care. He denies any myalgias. Patient claims he had recent carotid Doppler done which did not show signs of stenosis ordered by his primary care. He has had his evaluation for sleep study but has not had the actual study done she hasn't and was encouraged to do so. He has been seen by ophthalmology and now has glasses. Blood pressure in the office today 129/83. He  is currently on metformin. He is also trying to eliminate carbs from his diet. He has had no interval medical issues since last seen. He returns for reevaluation UPDATE 08/23/16 CMMr. PearlingtonHudson, 66 year old male returns for follow-up. He had hospital admission for stroke in December 2016. He returns today for follow-up without recurrent stroke or TIA symptoms. He is currently on aspirin 325 daily without significant bruising. He is on Lipitor for his cholesterol. He does little exercise due to bilateral knee pain from arthritis. He occasionally ambulates with a walker. Blood pressure 139/81 in the office today. HCTZ has been added since last seen. He has a history of obstructive sleep apnea but never went back for a titration. He states the test was performed greater than 5 years ago. His PCP had set up for him to be retested prior to his stroke. He was made aware  this ia a risk factor for stroke he needs to get rescheduled.  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 He is on hydrocodone and gabapentin for sciatica. He returns for reevaluation  HISTORY: 02/21/16 PSMr. Wilson SingerHudson is a 66 year old male seen today for first office follow-up visit for hospital admission for stroke in December 2016. Vita ErmJames Chawla is a 66 y.o. male history of hypertension, cardiac enlargement, sleep apnea and sciatica, presenting with new onset weakness involving left face arm and leg. In terms started at 4 PM on 12/09/2015. He has no previous history of stroke nor TIA. He has not been on antiplatelet therapy. CT scan of his head showed no acute intracranial abnormality. NIH stroke score was 4. LSN: 1 PM on 12/09/2015 tPA Given: No: Beyond time window for treatment consideration.  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 MRA of the brain showed no significant large vessel stenosis. Transthoracic echo showed normal ejection fraction without cardiac source of embolism. LDL cholesterol was 80 mg percent and hemoglobin A1c was 6.2. Urine drug screen was positive for cocaine. Carotid Doppler showed no significant extracranial stenosis. Patient was started on aspirin for stroke prevention. He states his done well since discharge. He has slightly diminished fine motor skills on the left and left leg occasionally main fragment is tired but speech is back to normal he feels subjectively is made full recovery. He states his blood pressure is quite good at home though he has some whitecoat hypertension and blood pressure is elevated today at 151/97 in office. The patient is tolerating Lipitor well without any  side effects. He is had some trouble swallowing in the past and has had recurrent bouts of pneumonia. Patient states his walking is limited mainly because of his arthritis in the knees and he takes oxycodone daily and  gabapentin for sciatica. Patient has been diagnosed with sleep apnea in  the past but he was not able to tolerate the CPAP mask and did not complete his titration  REVIEW OF SYSTEMS: Full 14 system review of systems performed and notable only for those listed, all others are neg:  Constitutional: neg  Cardiovascular: Leg swelling Ear/Nose/Throat: neg  Skin: neg Eyes: neg Respiratory: Wheezing Gastroitestinal: neg  Hematology/Lymphatic: neg  Endocrine: neg Musculoskeletal: Joint pain, knee pain  Allergy/Immunology: neg Neurological: neg Psychiatric: neg Sleep : History of obstructive sleep apnea   ALLERGIES: Allergies  Allergen Reactions  . Ivp Dye [Iodinated Diagnostic Agents] Other (See Comments)    Pt reports serious reaction in 1972 at [facility]  . Metrizamide Other (See Comments)    Pt reports serious reaction in 1972 at [facility] MEDICATIONS: Outpatient Medications Prior to Visit  Medication Sig Dispense Refill  . aspirin EC 325 MG tablet Take 1 tablet (325 mg total) by mouth daily. 30 tablet 0  . atorvastatin (LIPITOR) 20 MG tablet Take 1 tablet (20 mg total) by mouth daily at 6 PM. 30 tablet 0  . lisinopril (PRINIVIL,ZESTRIL) 20 MG tablet Take 20 mg by mouth daily.    . Oxycodone HCl 20 MG TABS Take 10 mg by mouth 4 (four) times daily as needed (severe pain).     . pantoprazole (PROTONIX) 40 MG tablet Take 1 tablet (40 mg total) by mouth daily. 30 tablet 0  . b complex vitamins tablet Take 1 tablet by mouth daily.    Marland Kitchen gabapentin (NEURONTIN) 300 MG capsule Take 300 mg by mouth 2 (two) times daily.     . hydrochlorothiazide (MICROZIDE) 12.5 MG capsule Take 12.5 mg by mouth daily.     No facility-administered medications prior to visit.     PAST MEDICAL HISTORY: Past Medical History:  Diagnosis Date  . Anxiety   . Arthritis    "knees" (12/20/2015)  . Aspiration pneumonia (HCC) 12/10/2015  . Depression   . Diabetes mellitus without complication (HCC)   . DVT (deep venous thrombosis) (HCC) 1990s   LLE  .  Enlarged heart   . GERD (gastroesophageal reflux disease)   . HCAP (healthcare-associated pneumonia) 12/20/2015  . Hyperlipidemia   . Hypertension   . Kidney stones    "passed them"  . On home oxygen therapy    "2L 24/7 last week; stopped 12/17/2015; dr said I could use it prn; had to use it again 12/19/2015"  . Phlebitis   . Sciatica   . Sciatica of right side   . Sleep apnea   . Stroke (HCC) 12/10/2015   denies residual on 12/20/2015    PAST SURGICAL HISTORY: Past Surgical History:  Procedure Laterality Date  . FEMUR FRACTURE SURGERY Left 1972  . FRACTURE SURGERY    . KNEE CARTILAGE SURGERY Right    scope  . NASAL SEPTUM SURGERY  ?2000s  . TONSILLECTOMY      FAMILY HISTORY: Family History  Problem Relation Age of Onset  . Stroke Father   . Diabetes Mellitus II Neg Hx     SOCIAL HISTORY: Social History   Social History  . Marital status: Divorced    Spouse name: N/A  . Number of children: N/A  .  Years of education: N/A   Occupational History  . Not on file.   Social History Main Topics  . Smoking status: Former Smoker    Packs/day: 3.00    Years: 23.00    Types: Cigarettes    Quit date: 10/27/1992  . Smokeless tobacco: Never Used  . Alcohol use No     Comment: 12/20/2015 "I'll have a few drinks once/month"  . Drug use: No     Comment: 12/20/2015 "at parties about 3-4 times a year"  . Sexual activity: Not Currently   Other Topics Concern  . Not on file   Social History Narrative  . No narrative on file     PHYSICAL EXAM  Vitals:   02/23/17 0841  BP: 129/83  Pulse: 75  Weight: 291 lb 9.6 oz (132.3 kg)   Body mass index is 40.67 kg/m. General: well developed, well nourished, Obese male seated, in no evident distress Head: head normocephalic and atraumatic.  Neck: supple with no carotid  bruits Cardiovascular: regular rate and rhythm, no murmurs Musculoskeletal: no deformity Neurological examination  Mental Status: Awake and fully alert.  Oriented to place and time. Recent and remote memory intact. Attention span, concentration and fund of knowledge appropriate. Mood and affect appropriate.  Cranial Nerves:  Pupils equal, briskly reactive to light. Extraocular movements full without nystagmus. Visual fields full to confrontation. Hearing intact. Facial sensation intact. Face, tongue, palate moves normally and symmetrically.  Motor: Normal bulk and tone. Normal strength in all tested extremity muscles.  Sensory.: intact to touch ,pinprick .position and vibratory sensation In the upper and lower extremities.  Coordination: Rapid alternating movements normal in all extremities. Finger-to-nose and heel-to-shin performed accurately bilaterally. Gait and Station: Arises from chair without difficulty. Stance is normal. Gait demonstrates normal stride length and balance . Able to heel, toe and tandem walk with mild  difficulty. No assistive device Reflexes: 1+ and symmetric. Toes downgoing.   DIAGNOSTIC DATA (LABS, IMAGING, TESTING) - I reviewed patient records, labs, notes, testing and imaging myself where available.  Lab Results  Component Value Date   WBC 13.8 (H) 12/22/2015   HGB 11.7 (L) 12/22/2015   HCT 37.1 (L) 12/22/2015   MCV 90.0 12/22/2015   PLT 321 12/22/2015      Component Value Date/Time   NA 144 12/21/2015 0953   K 3.9 12/21/2015 0953   CL 107 12/21/2015 0953   CO2 27 12/21/2015 0953   GLUCOSE 116 (H) 12/21/2015 0953   BUN 12 12/21/2015 0953   CREATININE 0.88 12/21/2015 0953   CALCIUM 9.3 12/21/2015 0953   PROT 7.3 12/20/2015 0650   ALBUMIN 3.2 (L) 12/20/2015 0650   AST 23 12/20/2015 0650   ALT 13 (L) 12/20/2015 0650   ALKPHOS 62 12/20/2015 0650   BILITOT 0.5 12/20/2015 0650   GFRNONAA >60 12/21/2015 0953   GFRAA >60 12/21/2015 0953   Lab Results  Component Value Date   CHOL 123 12/10/2015   HDL 27 (L) 12/10/2015   LDLCALC 80 12/10/2015   TRIG 81 12/10/2015   CHOLHDL 4.6 12/10/2015   Lab Results    Component Value Date   HGBA1C 6.2 (H) 12/10/2015    ASSESSMENT AND PLAN 65 year patient with a right pontine infarct in December 2016 secondary to small vessel disease with vascular risk factors of hypertension, hyperlipidemia, sleep apnea and recently diagnosed with diabetes    PLAN: Continued management of stroke risk factors Keep systolic blood pressure less than 130, today's reading 129/83 Lipids are  followed by PCP  Keep LDL less than 70 continue Lipitor Carotid Doppler just repeated by PCP per patient Continue aspirin 325mg  daily for secondary stroke prevention diabetes with hemoglobin A1c goal below 6.5%  continue metformin No further stroke or TIA symptoms since December 2016 Advised the patient to eat a healthy diet with plenty of whole grains, cereals, fruits and vegetables, exercise regularly and maintain ideal body  Need to obtain repeat sleep study had been set up at PCP office If recurrent stroke symptoms occur, call 911 and proceed to the hospital Discharge from neurologic services at this time REMEMBER Pneumonic FAST which stands for  F stands  for face drooping and weakness etc. A stands for arms, weakness S stands for speech slurred  T stands for time to call 911 I spent 20 min  in total face to face time with the patient more than 50% of which was spent counseling and coordination of care, reviewing test results reviewing medications and discussing and reviewing the diagnosis of stroke and modification of risk factors. Also important to follow through on sleep study as it is also a risk factor for stroke. Nilda Riggs, [facility], [facility], APRN  [facility] [address] [place], [place] [address] [phone]

## 2017-04-08 IMAGING — CR DG CHEST 1V PORT
1 series · 1 of 1 positions shown · non-contrast
Comparison: December 10, 2015 study obtained earlier in the day and
October 27, 2014

CLINICAL DATA: Shortness of breath with productive cough

EXAM:
PORTABLE CHEST 1 VIEW

[AP]
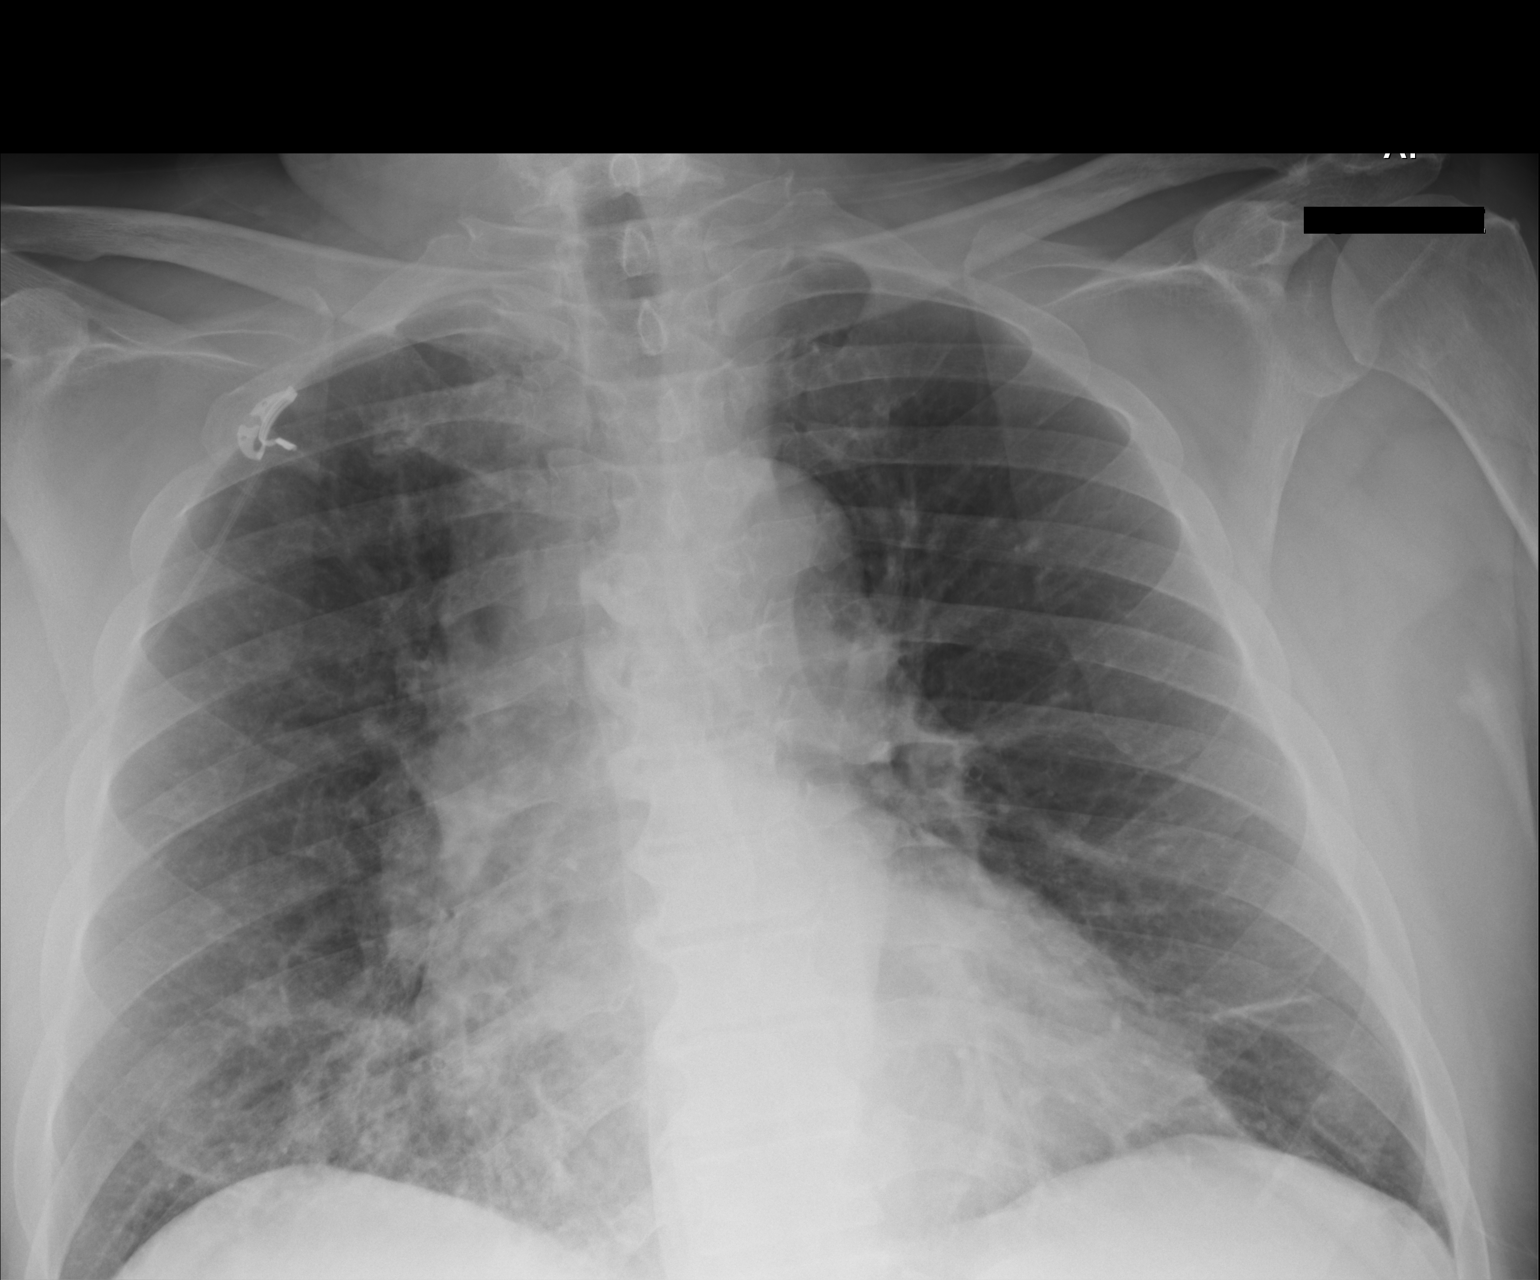

[1 of 1 positions shown; findings below may reference images not displayed]

FINDINGS: There is patchy airspace opacity in the right base. There is mild
scarring in the left base. Lungs elsewhere clear. Heart is
borderline prominent with pulmonary vascularity within normal
limits. No adenopathy. There is degenerative change in the thoracic
spine.
IMPRESSION: Patchy infiltrate in the right base. Question early pneumonia versus
aspiration. Lungs elsewhere are clear except for mild scarring in
the left base which is stable. Stable cardiac prominence.

## 2017-04-08 IMAGING — MR MR HEAD W/O CM
8 series · 34 of 48 positions shown · non-contrast
Comparison: None.

CLINICAL DATA: Acute onset of symptoms that started 12/09/2015
around 4 p.m.. LEFT-sided weakness. Slurred speech. Stroke risk
factors include hypertension.

EXAM:
MRI HEAD WITHOUT CONTRAST
MRA HEAD WITHOUT CONTRAST
TECHNIQUE: Multiplanar, multiecho pulse sequences of the brain and surrounding
structures were obtained without intravenous contrast. Angiographic
images of the head were obtained using MRA technique without
contrast.

[Series 2: FLAIR · sagittal · 5.0mm · 0.47mm/px · 3 of 25 slices shown (1 of 2)]
[im 1/25]
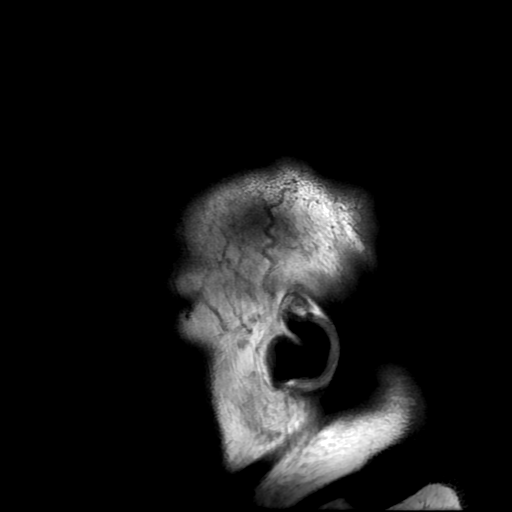
[im 13/25]
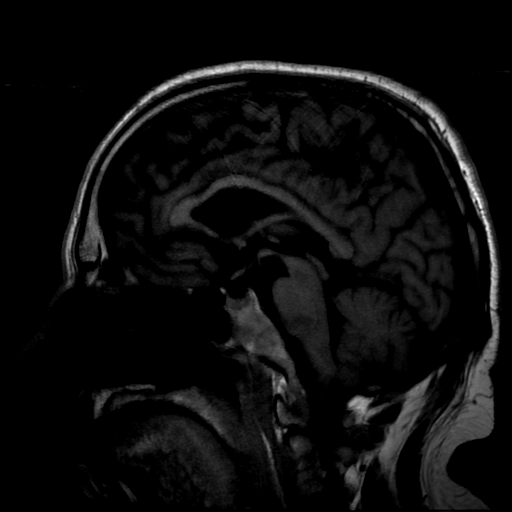
[im 25/25]
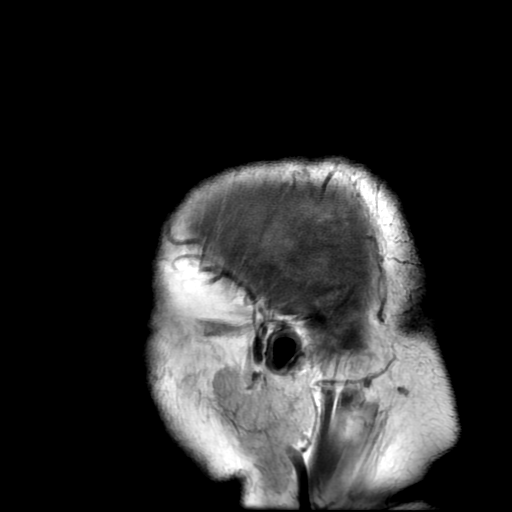

[Series 4: DWI · axial · 3.6mm · 0.94mm/px · z∈[-81,+55]mm · 8 of 78 slices shown (1 of 4)]
[im 1/78]
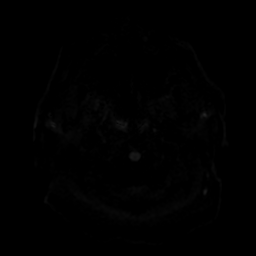
[im 12/78]
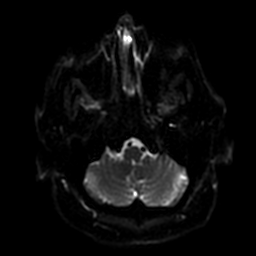
[im 23/78]
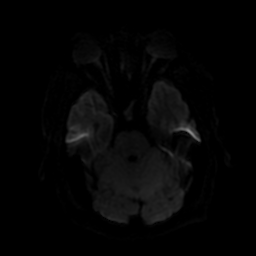
[im 34/78]
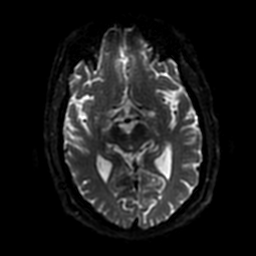
[im 45/78]
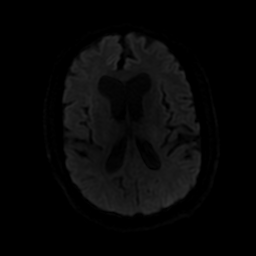
[im 56/78]
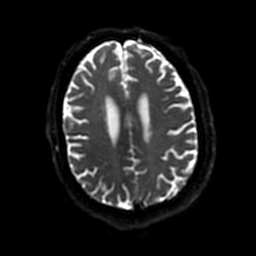
[im 67/78]
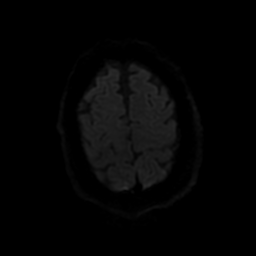
[im 78/78]
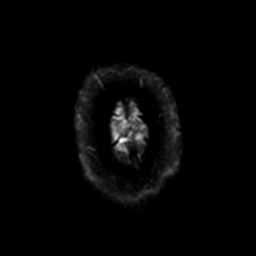

[Series 5: ax (id) 2 · axial · 1.2mm · 0.43mm/px · z∈[-82,-23]mm · 5 of 200 slices shown]
[im 12/200]
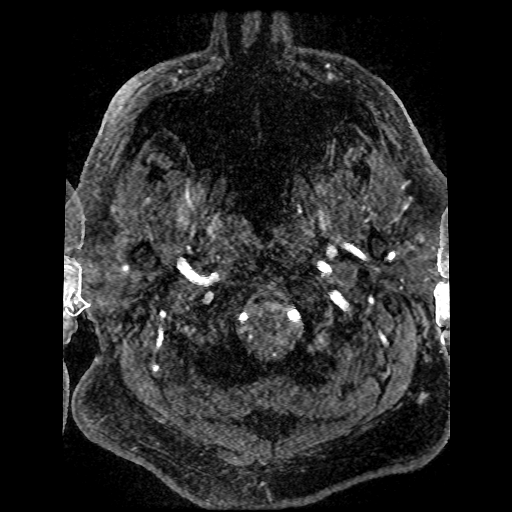
[im 34/200]
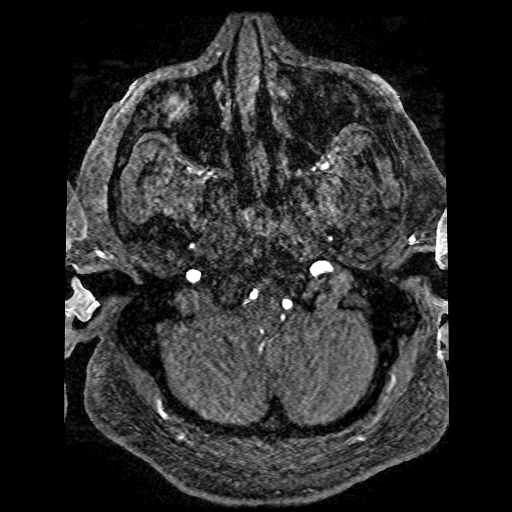
[im 56/200]
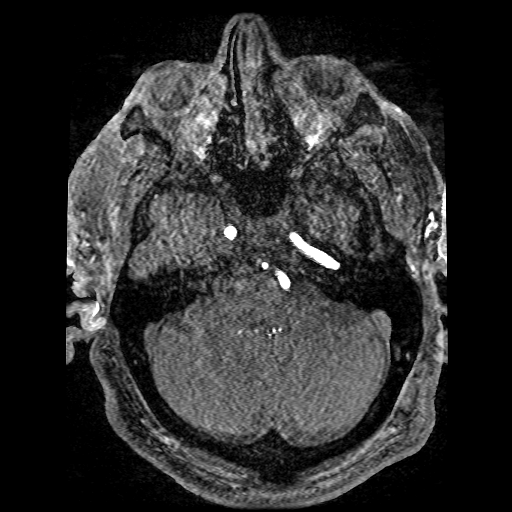
[im 89/200]
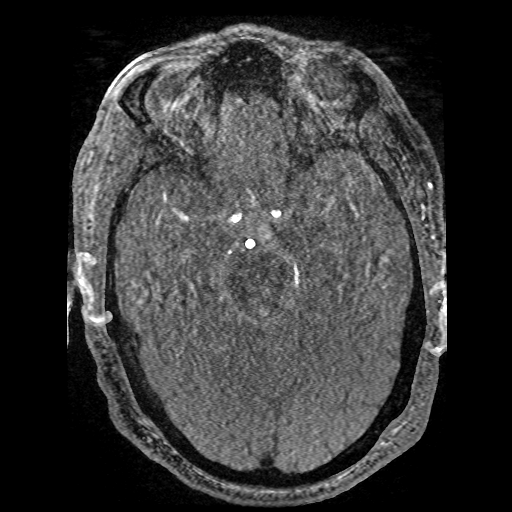
[im 111/200]
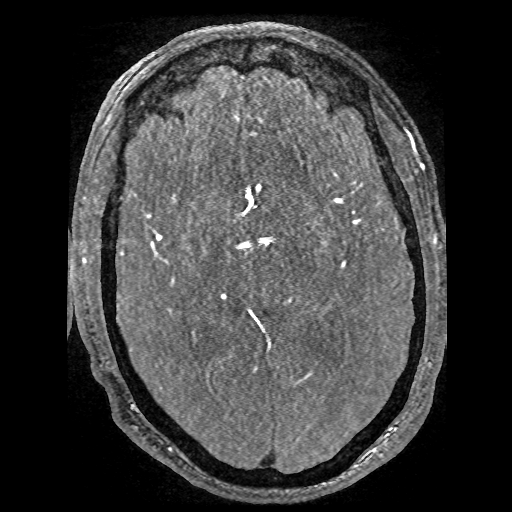

[Series 6: T2 · axial · 5.0mm · 0.47mm/px · z∈[-86,+57]mm · 2 of 25 slices shown]
[im 1/25]
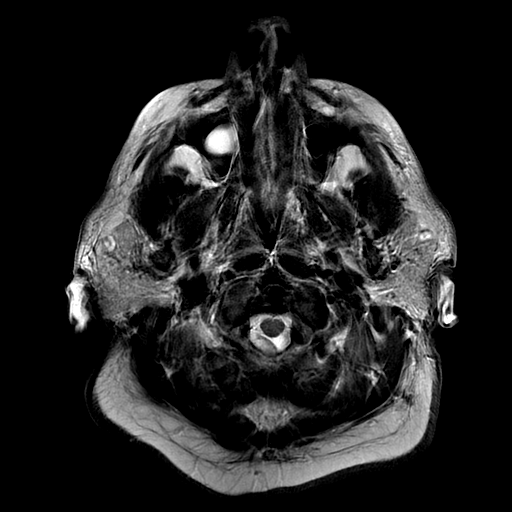
[im 25/25]
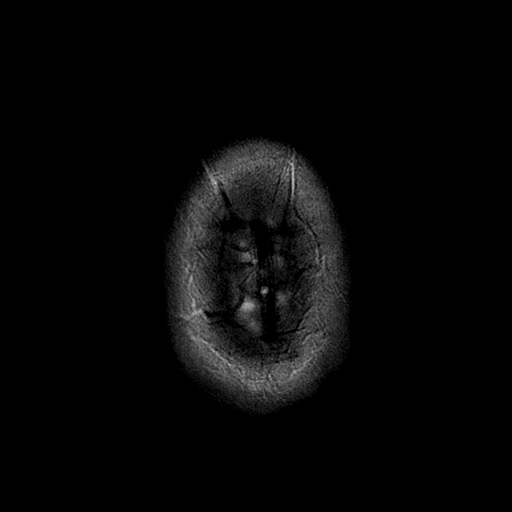

[Series 7: FLAIR · axial · 5.0mm · 0.47mm/px · z∈[-86,+57]mm · 2 of 25 slices shown (2 of 2)]
[im 1/25]
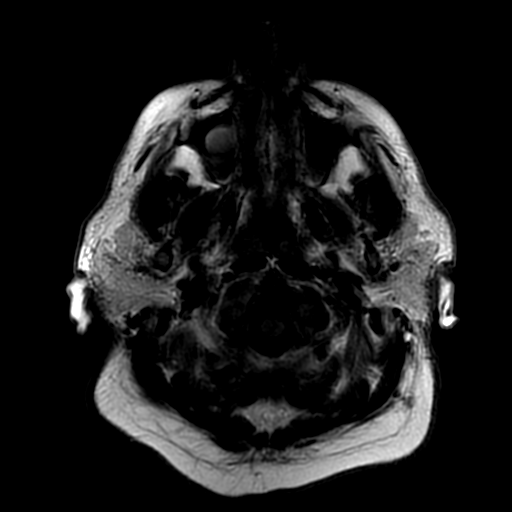
[im 25/25]
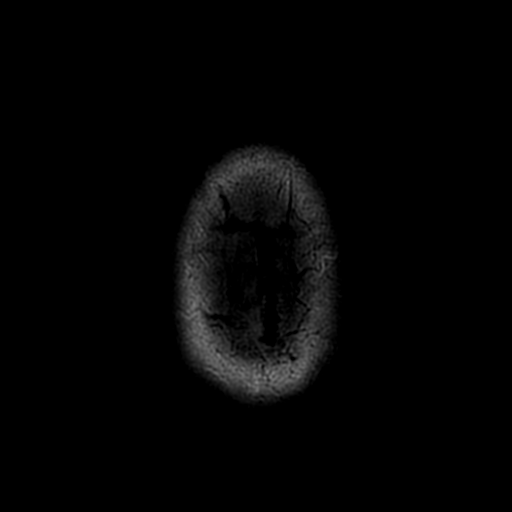

[Series 8: DWI · coronal · 5.0mm · 0.94mm/px · 7 of 72 slices shown (2 of 4)]
[im 1/72]
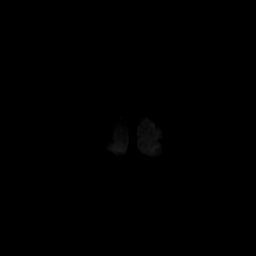
[im 12/72]
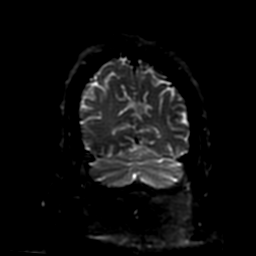
[im 24/72]
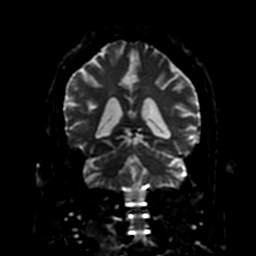
[im 36/72]
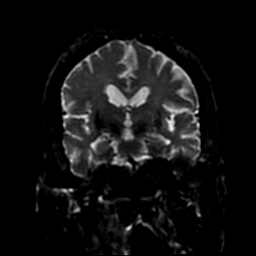
[im 48/72]
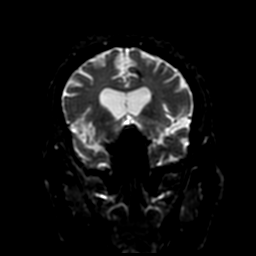
[im 60/72]
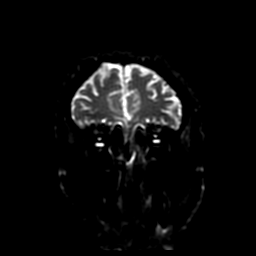
[im 72/72]
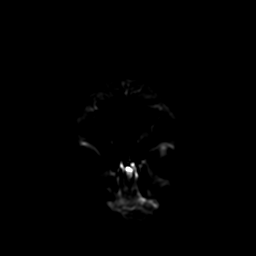

[Series 400: DWI · axial · 3.6mm · 0.94mm/px · z∈[-81,+55]mm · 4 of 39 slices shown (3 of 4)]
[im 1/39]
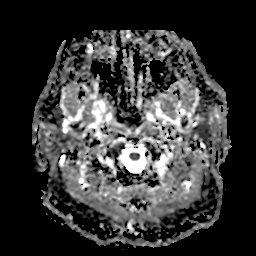
[im 13/39]
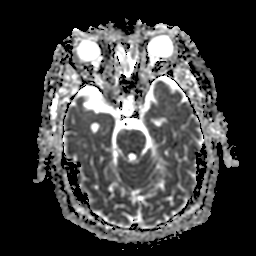
[im 26/39]
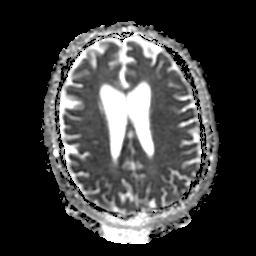
[im 39/39]
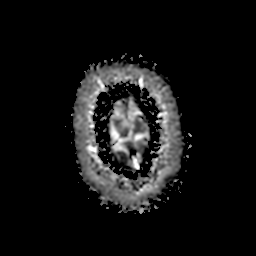

[Series 800: DWI · coronal · 5.0mm · 0.94mm/px · 3 of 36 slices shown (4 of 4)]
[im 1/36]
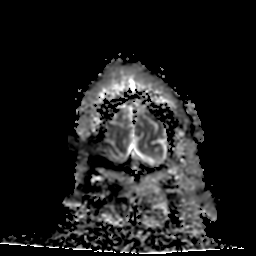
[im 18/36]
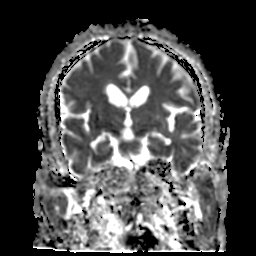
[im 36/36]
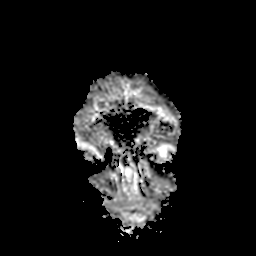

[34 of 48 positions shown; findings below may reference images not displayed]

FINDINGS: MRI HEAD FINDINGS

The patient had difficulty holding still and the exam was
prematurely truncated. Overall the exam is diagnostic for ischemia.

There is acute infarction affecting the RIGHT paramedian pons. This
extends over an anterior-posterior length of approximately 15 mm,
but is only 5 mm thick maximally, near its base. Within limits for
assessment due to the truncated exam, there is no associated
hemorrhage, mass lesion, or extra-axial fluid.

Hydrocephalus ex vacuo representing central atrophy. Minor T2 and
FLAIR hyperintensities throughout the white matter, likely chronic
microvascular ischemic change.

Negative orbits. No acute sinus or mastoid disease. No midline
abnormality is evident.

MRA HEAD FINDINGS

There are dolichoectatic but widely patent BILATERAL internal
carotid arteries.

Both anterior cerebral arteries are robust. Proximal M1 MCA vessels
widely patent. Artifactual signal loss at the BILATERAL MCA
trifurcations, as well as the BILATERAL ambient segments PCA, as
seen on axial source images from series 5. Basilar artery shows no
irregularity, stenosis, or dissection. Both vertebrals are
contributory, LEFT dominant. No cerebellar branch occlusion.
Proximal PCA vessels widely patent.

No visible intracranial aneurysm.
IMPRESSION: Acute RIGHT paramedian pontine infarct without visible hemorrhage.
This abnormality is not visible on prior CT.

Central atrophy with relatively mild small vessel disease.

Dolichoectatic cerebral vasculature without proximal stenosis or
dissection.

## 2017-04-08 IMAGING — CT CT HEAD W/O CM
2 series · 15 of 30 positions shown, 17 images · non-contrast
Comparison: None.

CLINICAL DATA: Left-sided weakness

EXAM:
CT HEAD WITHOUT CONTRAST
TECHNIQUE: Contiguous axial images were obtained from the base of the skull
through the vertex without intravenous contrast.

[Series 2: head bone · axial · 0.50mm/px · z∈[-118,+10]mm · 8 of 82 slices shown]
[im 9/82  bone]
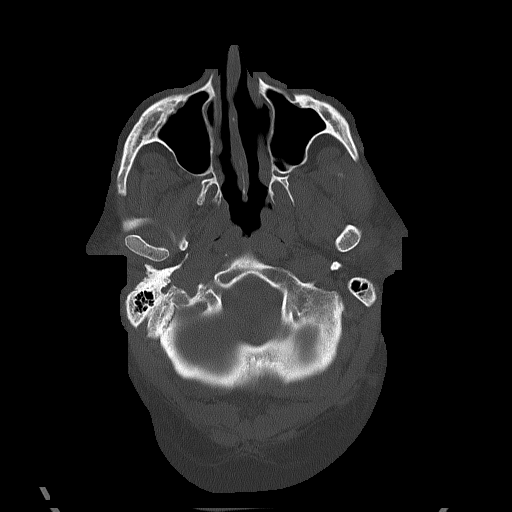
[im 17/82  bone]
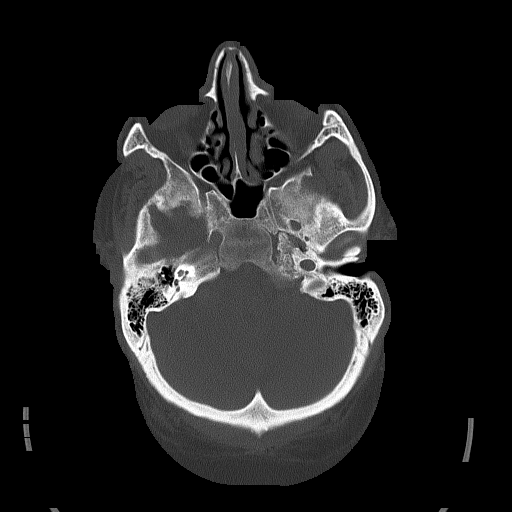
[im 25/82  bone]
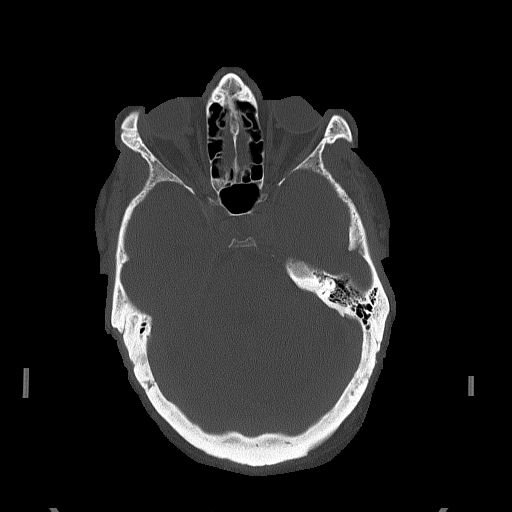
[im 37/82  bone]
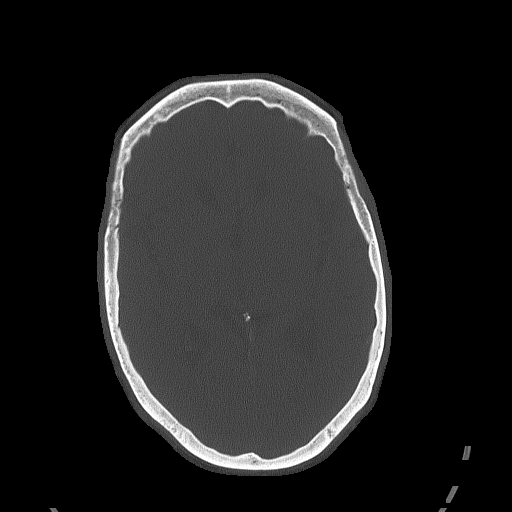
[im 45/82  bone]
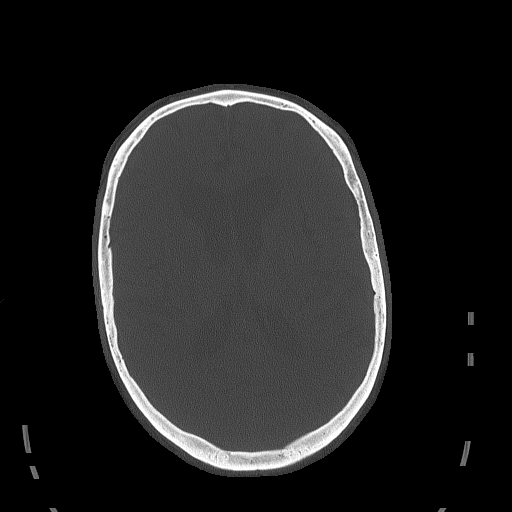
[im 57/82  bone]
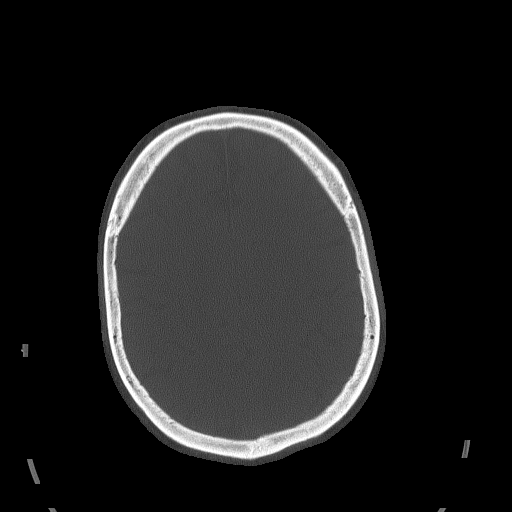
[im 65/82  bone]
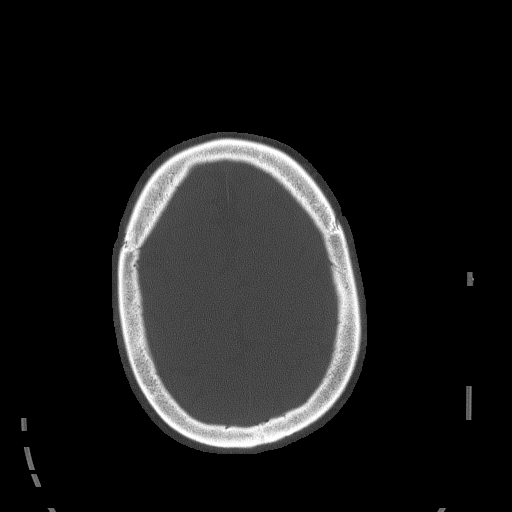
[im 73/82  bone]
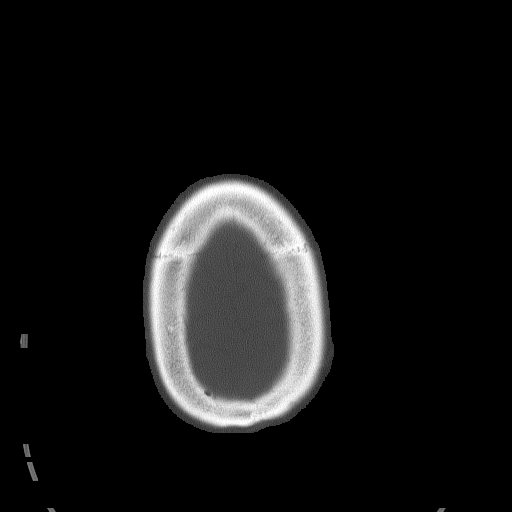

[Series 3: head without · axial · non-contrast · 0.50mm/px · z∈[-114,+6]mm · 7 of 33 slices shown, 9 images]
[im 5/33  brain]
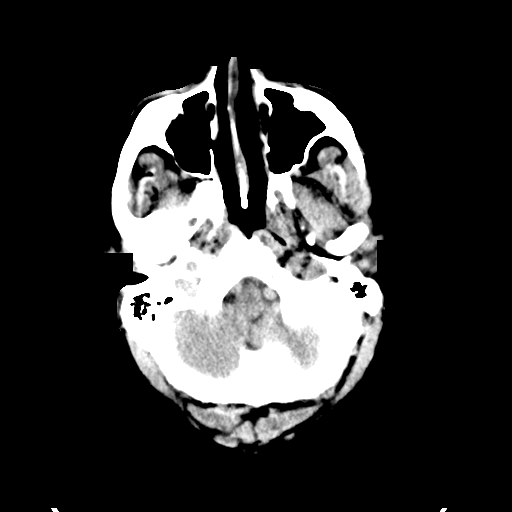
[im 5/33  bone]
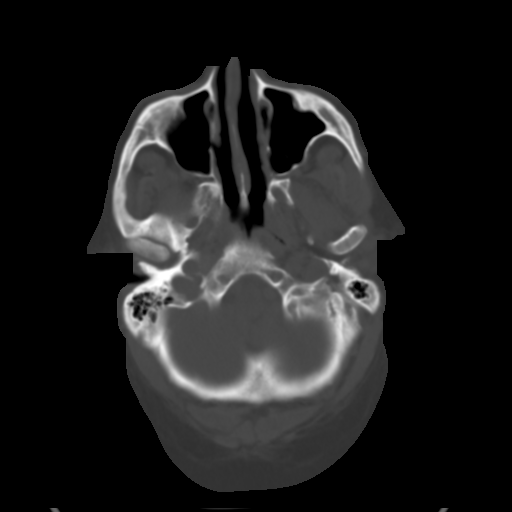
[im 9/33  brain]
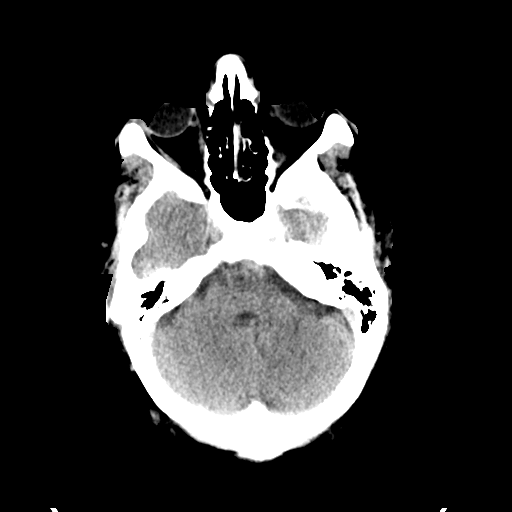
[im 13/33  brain]
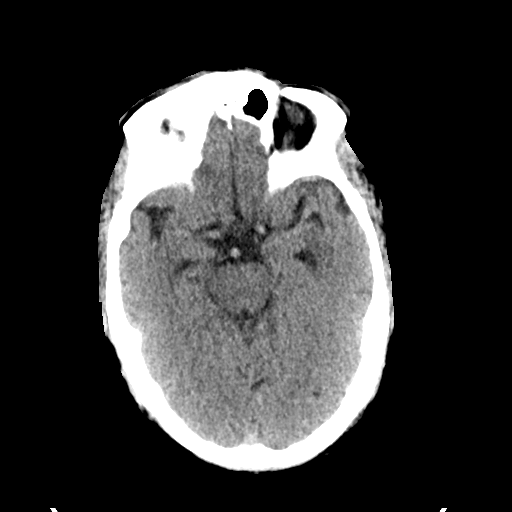
[im 17/33  brain]
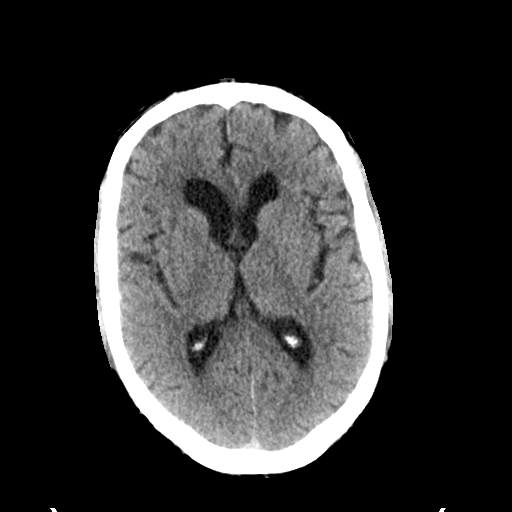
[im 21/33  brain]
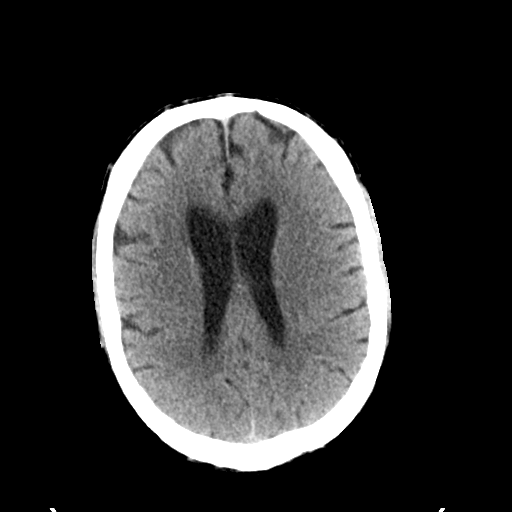
[im 21/33  bone]
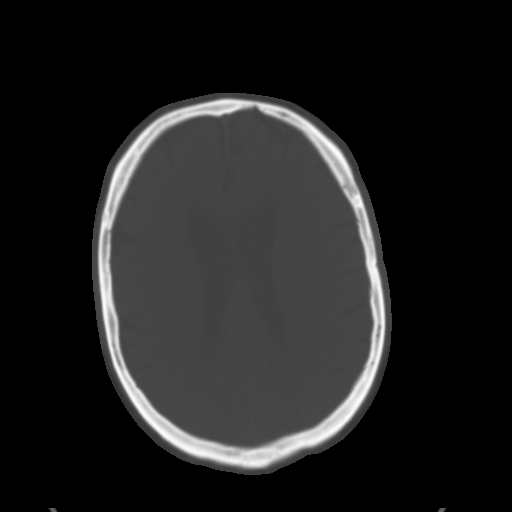
[im 25/33  brain]
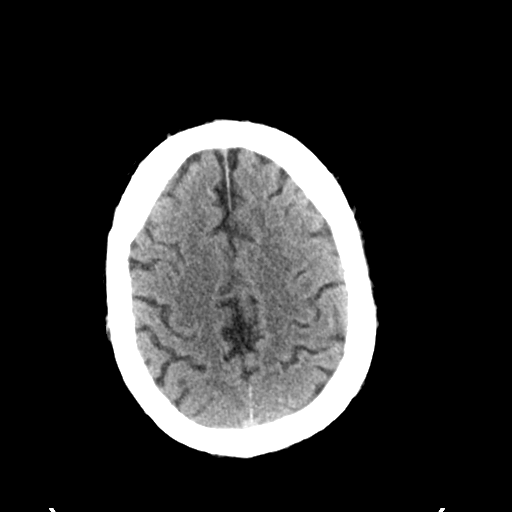
[im 29/33  brain]
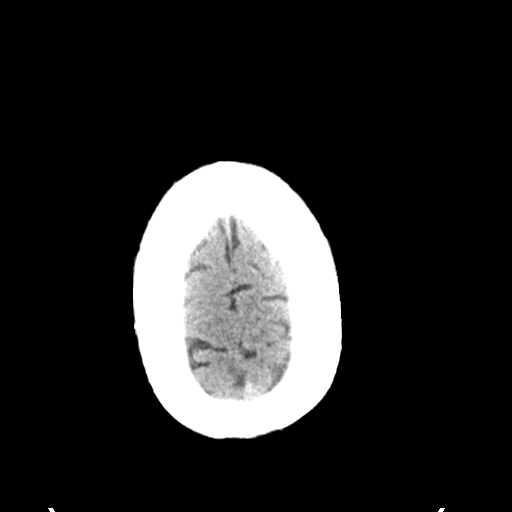

[15 of 30 positions shown; findings below may reference images not displayed]

FINDINGS: Ventricles and sulci appear symmetrical. Mild ventricular dilatation
consistent with mild central atrophy. No mass effect or midline
shift. No abnormal extra-axial fluid collections. Gray-white matter
junctions are distinct. Basal cisterns are not effaced. No evidence
of acute intracranial hemorrhage. No depressed skull fractures.
Retention cyst in the right maxillary antrum. No acute air-fluid
levels in the paranasal sinuses. Mastoid air cells are not
opacified.
IMPRESSION: No acute intracranial abnormalities.  Mild central atrophy.

## 2017-09-12 ENCOUNTER — Emergency Department (HOSPITAL_COMMUNITY): Payer: Medicare Other

## 2017-09-12 ENCOUNTER — Encounter (HOSPITAL_COMMUNITY): Payer: Self-pay | Admitting: *Deleted

## 2017-09-12 ENCOUNTER — Inpatient Hospital Stay (HOSPITAL_COMMUNITY)
Admission: EM | Admit: 2017-09-12 | Discharge: 2017-09-16 | DRG: 644 | Disposition: A | Discharge status: Home Health | Payer: Medicare Other | Attending: Internal Medicine | Admitting: Internal Medicine

## 2017-09-12 DIAGNOSIS — I9589 Other hypotension: Secondary | ICD-10-CM

## 2017-09-12 DIAGNOSIS — D649 Anemia, unspecified: Secondary | ICD-10-CM | POA: Clinically undetermined

## 2017-09-12 DIAGNOSIS — Z87442 Personal history of urinary calculi: Secondary | ICD-10-CM | POA: Diagnosis not present

## 2017-09-12 DIAGNOSIS — E1165 Type 2 diabetes mellitus with hyperglycemia: Secondary | ICD-10-CM | POA: Diagnosis present

## 2017-09-12 DIAGNOSIS — R791 Abnormal coagulation profile: Secondary | ICD-10-CM | POA: Diagnosis present

## 2017-09-12 DIAGNOSIS — Z8673 Personal history of transient ischemic attack (TIA), and cerebral infarction without residual deficits: Secondary | ICD-10-CM

## 2017-09-12 DIAGNOSIS — F329 Major depressive disorder, single episode, unspecified: Secondary | ICD-10-CM | POA: Diagnosis present

## 2017-09-12 DIAGNOSIS — Z9981 Dependence on supplemental oxygen: Secondary | ICD-10-CM | POA: Diagnosis not present

## 2017-09-12 DIAGNOSIS — R251 Tremor, unspecified: Secondary | ICD-10-CM | POA: Diagnosis not present

## 2017-09-12 DIAGNOSIS — I959 Hypotension, unspecified: Secondary | ICD-10-CM | POA: Diagnosis present

## 2017-09-12 DIAGNOSIS — Z87891 Personal history of nicotine dependence: Secondary | ICD-10-CM

## 2017-09-12 DIAGNOSIS — Z8672 Personal history of thrombophlebitis: Secondary | ICD-10-CM | POA: Diagnosis not present

## 2017-09-12 DIAGNOSIS — E2740 Unspecified adrenocortical insufficiency: Secondary | ICD-10-CM | POA: Diagnosis present

## 2017-09-12 DIAGNOSIS — R7989 Other specified abnormal findings of blood chemistry: Secondary | ICD-10-CM | POA: Diagnosis present

## 2017-09-12 DIAGNOSIS — R079 Chest pain, unspecified: Secondary | ICD-10-CM | POA: Diagnosis present

## 2017-09-12 DIAGNOSIS — I10 Essential (primary) hypertension: Secondary | ICD-10-CM | POA: Diagnosis present

## 2017-09-12 DIAGNOSIS — Z7984 Long term (current) use of oral hypoglycemic drugs: Secondary | ICD-10-CM

## 2017-09-12 DIAGNOSIS — Z8701 Personal history of pneumonia (recurrent): Secondary | ICD-10-CM | POA: Diagnosis not present

## 2017-09-12 DIAGNOSIS — E119 Type 2 diabetes mellitus without complications: Secondary | ICD-10-CM

## 2017-09-12 DIAGNOSIS — Z91041 Radiographic dye allergy status: Secondary | ICD-10-CM

## 2017-09-12 DIAGNOSIS — F419 Anxiety disorder, unspecified: Secondary | ICD-10-CM | POA: Diagnosis present

## 2017-09-12 DIAGNOSIS — Z86718 Personal history of other venous thrombosis and embolism: Secondary | ICD-10-CM | POA: Diagnosis not present

## 2017-09-12 DIAGNOSIS — G4730 Sleep apnea, unspecified: Secondary | ICD-10-CM | POA: Diagnosis present

## 2017-09-12 DIAGNOSIS — Z823 Family history of stroke: Secondary | ICD-10-CM | POA: Diagnosis not present

## 2017-09-12 DIAGNOSIS — K219 Gastro-esophageal reflux disease without esophagitis: Secondary | ICD-10-CM | POA: Diagnosis present

## 2017-09-12 DIAGNOSIS — M170 Bilateral primary osteoarthritis of knee: Secondary | ICD-10-CM | POA: Diagnosis present

## 2017-09-12 DIAGNOSIS — N179 Acute kidney failure, unspecified: Secondary | ICD-10-CM | POA: Diagnosis present

## 2017-09-12 DIAGNOSIS — Z7982 Long term (current) use of aspirin: Secondary | ICD-10-CM

## 2017-09-12 DIAGNOSIS — Z79899 Other long term (current) drug therapy: Secondary | ICD-10-CM

## 2017-09-12 DIAGNOSIS — Z7901 Long term (current) use of anticoagulants: Secondary | ICD-10-CM

## 2017-09-12 DIAGNOSIS — E785 Hyperlipidemia, unspecified: Secondary | ICD-10-CM | POA: Diagnosis present

## 2017-09-12 DIAGNOSIS — E1149 Type 2 diabetes mellitus with other diabetic neurological complication: Secondary | ICD-10-CM

## 2017-09-12 DIAGNOSIS — Z888 Allergy status to other drugs, medicaments and biological substances status: Secondary | ICD-10-CM

## 2017-09-12 DIAGNOSIS — I951 Orthostatic hypotension: Secondary | ICD-10-CM | POA: Diagnosis present

## 2017-09-12 DIAGNOSIS — I824Y2 Acute embolism and thrombosis of unspecified deep veins of left proximal lower extremity: Secondary | ICD-10-CM | POA: Diagnosis present

## 2017-09-12 DIAGNOSIS — R42 Dizziness and giddiness: Secondary | ICD-10-CM | POA: Diagnosis not present

## 2017-09-12 LAB — URINALYSIS, ROUTINE W REFLEX MICROSCOPIC
Bilirubin Urine: NEGATIVE
Glucose, UA: NEGATIVE mg/dL
Hgb urine dipstick: NEGATIVE
Ketones, ur: NEGATIVE mg/dL
Leukocytes, UA: NEGATIVE
NITRITE: NEGATIVE
PH: 5 (ref 5.0–8.0)
Protein, ur: NEGATIVE mg/dL
SPECIFIC GRAVITY, URINE: 1.017 (ref 1.005–1.030)

## 2017-09-12 LAB — COMPREHENSIVE METABOLIC PANEL
ALT: 18 U/L (ref 17–63)
ANION GAP: 12 (ref 5–15)
AST: 20 U/L (ref 15–41)
Albumin: 3.5 g/dL (ref 3.5–5.0)
Alkaline Phosphatase: 44 U/L (ref 38–126)
BUN: 48 mg/dL — ABNORMAL HIGH (ref 6–20)
CALCIUM: 8.6 mg/dL — AB (ref 8.9–10.3)
CHLORIDE: 108 mmol/L (ref 101–111)
CO2: 19 mmol/L — AB (ref 22–32)
CREATININE: 1.96 mg/dL — AB (ref 0.61–1.24)
GFR, EST AFRICAN AMERICAN: 39 mL/min — AB (ref >60)
GFR, EST NON AFRICAN AMERICAN: 34 mL/min — AB (ref >60)
Glucose, Bld: 137 mg/dL — ABNORMAL HIGH (ref 65–99)
Potassium: 4.3 mmol/L (ref 3.5–5.1)
SODIUM: 139 mmol/L (ref 135–145)
Total Bilirubin: 0.4 mg/dL (ref 0.3–1.2)
Total Protein: 6.1 g/dL — ABNORMAL LOW (ref 6.5–8.1)

## 2017-09-12 LAB — CBC WITH DIFFERENTIAL/PLATELET
BASOS PCT: 1 %
Basophils Absolute: 0.1 10*3/uL (ref 0.0–0.1)
EOS ABS: 0.2 10*3/uL (ref 0.0–0.7)
Eosinophils Relative: 1 %
HCT: 35 % — ABNORMAL LOW (ref 39.0–52.0)
HEMOGLOBIN: 11.1 g/dL — AB (ref 13.0–17.0)
LYMPHS ABS: 2.5 10*3/uL (ref 0.7–4.0)
Lymphocytes Relative: 15 %
MCH: 28 pg (ref 26.0–34.0)
MCHC: 31.7 g/dL (ref 30.0–36.0)
MCV: 88.4 fL (ref 78.0–100.0)
Monocytes Absolute: 0.7 10*3/uL (ref 0.1–1.0)
Monocytes Relative: 4 %
Neutro Abs: 13.5 10*3/uL — ABNORMAL HIGH (ref 1.7–7.7)
Neutrophils Relative %: 79 %
Platelets: 275 10*3/uL (ref 150–400)
RBC: 3.96 MIL/uL — AB (ref 4.22–5.81)
RDW: 14.9 % (ref 11.5–15.5)
WBC: 17 10*3/uL — AB (ref 4.0–10.5)

## 2017-09-12 LAB — I-STAT TROPONIN, ED: TROPONIN I, POC: 0 ng/mL (ref 0.00–0.08)

## 2017-09-12 LAB — BRAIN NATRIURETIC PEPTIDE: B NATRIURETIC PEPTIDE 5: 17.7 pg/mL (ref 0.0–100.0)

## 2017-09-12 LAB — I-STAT CG4 LACTIC ACID, ED
LACTIC ACID, VENOUS: 2.56 mmol/L — AB (ref 0.5–1.9)
LACTIC ACID, VENOUS: 1.23 mmol/L (ref 0.5–1.9)

## 2017-09-12 LAB — PROTIME-INR
INR: 4.12 — AB
PROTHROMBIN TIME: 39.6 s — AB (ref 11.4–15.2)

## 2017-09-12 MED ORDER — ACETAMINOPHEN 650 MG RE SUPP
650.0000 mg | Freq: Four times a day (QID) | RECTAL | Status: DC | PRN
Start: 2017-09-12 — End: 2017-09-16

## 2017-09-12 MED ORDER — HYDROXYZINE HCL 25 MG PO TABS
25.0000 mg | ORAL_TABLET | Freq: Three times a day (TID) | ORAL | Status: DC
Start: 2017-09-12 — End: 2017-09-16
  Administered 2017-09-12 – 2017-09-16 (×10): 25 mg via ORAL
  Filled 2017-09-12 (×10): qty 1

## 2017-09-12 MED ORDER — SODIUM CHLORIDE 0.9 % IV BOLUS (SEPSIS)
1000.0000 mL | Freq: Once | INTRAVENOUS | Status: AC
  Administered 2017-09-12: 1000 mL via INTRAVENOUS

## 2017-09-12 MED ORDER — INSULIN ASPART 100 UNIT/ML ~~LOC~~ SOLN
0.0000 [IU] | SUBCUTANEOUS | Status: DC
  Administered 2017-09-13: 1 [IU] via SUBCUTANEOUS
  Administered 2017-09-13: 2 [IU] via SUBCUTANEOUS
  Administered 2017-09-14: 1 [IU] via SUBCUTANEOUS
  Administered 2017-09-14 – 2017-09-15 (×2): 2 [IU] via SUBCUTANEOUS
  Administered 2017-09-15 (×2): 1 [IU] via SUBCUTANEOUS
  Filled 2017-09-12: qty 1

## 2017-09-12 MED ORDER — WARFARIN - PHARMACIST DOSING INPATIENT
Freq: Every day | Status: DC
  Administered 2017-09-15: 18:00:00

## 2017-09-12 MED ORDER — PANTOPRAZOLE SODIUM 40 MG PO TBEC
40.0000 mg | DELAYED_RELEASE_TABLET | Freq: Every day | ORAL | Status: DC
  Administered 2017-09-13 – 2017-09-16 (×4): 40 mg via ORAL
  Filled 2017-09-12 (×4): qty 1

## 2017-09-12 MED ORDER — ATORVASTATIN CALCIUM 20 MG PO TABS
20.0000 mg | ORAL_TABLET | Freq: Every day | ORAL | Status: DC
  Administered 2017-09-13 – 2017-09-16 (×4): 20 mg via ORAL
  Filled 2017-09-12 (×5): qty 1

## 2017-09-12 MED ORDER — ONDANSETRON HCL 4 MG PO TABS: 4.0000 mg | ORAL_TABLET | Freq: Four times a day (QID) | ORAL | Status: DC | PRN

## 2017-09-12 MED ORDER — SODIUM CHLORIDE 0.9 % IV BOLUS (SEPSIS): 500.0000 mL | Freq: Once | INTRAVENOUS | Status: AC

## 2017-09-12 MED ORDER — ASPIRIN EC 325 MG PO TBEC
325.0000 mg | DELAYED_RELEASE_TABLET | Freq: Every evening | ORAL | Status: DC
  Administered 2017-09-12 – 2017-09-15 (×4): 325 mg via ORAL
  Filled 2017-09-12 (×4): qty 1

## 2017-09-12 MED ORDER — GI COCKTAIL ~~LOC~~
30.0000 mL | Freq: Three times a day (TID) | ORAL | Status: DC | PRN
  Administered 2017-09-13: 30 mL via ORAL

## 2017-09-12 MED ORDER — HEPARIN (PORCINE) IN NACL 100-0.45 UNIT/ML-% IJ SOLN
1700.0000 [IU]/h | INTRAMUSCULAR | Status: DC
  Administered 2017-09-12: 1700 [IU]/h via INTRAVENOUS
  Filled 2017-09-12: qty 250

## 2017-09-12 MED ORDER — ACETAMINOPHEN 325 MG PO TABS: 650.0000 mg | ORAL_TABLET | Freq: Four times a day (QID) | ORAL | Status: DC | PRN

## 2017-09-12 MED ORDER — ONDANSETRON HCL 4 MG/2ML IJ SOLN
4.0000 mg | Freq: Four times a day (QID) | INTRAMUSCULAR | Status: DC | PRN
Start: 2017-09-12 — End: 2017-09-16

## 2017-09-12 MED ORDER — GABAPENTIN 300 MG PO CAPS
300.0000 mg | ORAL_CAPSULE | Freq: Two times a day (BID) | ORAL | Status: DC
  Administered 2017-09-12 – 2017-09-16 (×8): 300 mg via ORAL
  Filled 2017-09-12 (×8): qty 1

## 2017-09-12 MED ORDER — BUSPIRONE HCL 5 MG PO TABS
5.0000 mg | ORAL_TABLET | Freq: Two times a day (BID) | ORAL | Status: DC
  Administered 2017-09-12 – 2017-09-16 (×8): 5 mg via ORAL
  Filled 2017-09-12 (×9): qty 1

## 2017-09-12 NOTE — Progress Notes (Signed)
ANTICOAGULATION CONSULT NOTE - Initial Consult  Pharmacy Consult for heparin Indication: DVT and r/o PE  Allergies  Allergen Reactions  . Ivp Dye [Iodinated Diagnostic Agents] Anaphylaxis and Other (See Comments)    RESPIRATORY ARREST AND Pt reports serious reaction in 1972 at [facility] (PATIENT WAS TOLD THAT IF IT WAS ADMINISTERED TO HIM AGAIN HE "WILL DIE")  . Metrizamide Anaphylaxis and Other (See Comments)    RESPIRATORY ARREST AND Pt reports serious reaction in 1972 at [facility] (PATIENT WAS TOLD THAT IF IT WAS ADMINISTERED TO HIM AGAIN HE "WILL DIE")  . Zoloft [Sertraline Hcl] Diarrhea and Other (See Comments)    Also hallucinations, made patient feel trapped, and increased anxiety    Patient Measurements: Height:  (180.3 cm) Weight: 263 lb (119.3 kg) IBW/kg (Calculated) : 75.3 Heparin Dosing Weight: 101.7kg  Vital Signs: Temp: 98.5 F (36.9 C) (09/19 1322) Temp Source: Rectal (09/19 1322) BP: 92/65 (09/19 2045) Pulse Rate: 74 (09/19 2045)  Labs:  Recent Labs  09/12/17 1313  HGB 11.1*  HCT 35.0*  PLT 275  CREATININE 1.96*    Estimated Creatinine Clearance: 48.7 mL/min (A) (by C-G formula based on SCr of 1.96 mg/dL (H)).   Medical History: Past Medical History:  Diagnosis Date  . Anxiety   . Arthritis    "knees" (12/20/2015)  . Aspiration pneumonia (HCC) 12/10/2015  . Depression   . Diabetes mellitus without complication (HCC)   . DVT (deep venous thrombosis) (HCC) 1990s   LLE  . Enlarged heart   . GERD (gastroesophageal reflux disease)   . HCAP (healthcare-associated pneumonia) 12/20/2015  . Hyperlipidemia   . Hypertension   . Kidney stones    "passed them"  . On home oxygen therapy    "2L 24/7 last week; stopped 12/17/2015; dr said I could use it prn; had to use it again 12/19/2015"  . Phlebitis   . Sciatica   . Sciatica of right side   . Sleep apnea   . Stroke (HCC) 12/10/2015   denies residual on 12/20/2015     Medications:  Infusions:  . heparin      Assessment: 65 yom presented to the ED with weakness and hypotension. He was recently (07/2017) diagnosed with DVT. Per discussion with MD, high suspicion for PE. However, unable to obtain to CT scan due to contrast allergy and renal dysfunction. Pt is on warfarin PTA and INR currently pending but will go ahead and start IV heparin due to high suspicion of PE. Baseline H/H slightly low but platelets are WNL.   Goal of Therapy:  Heparin level 0.3-0.7 units/ml Monitor platelets by anticoagulation protocol: Yes   Plan:  Heparin gtt 1700 units/hr - if INR returns subtherapeutic, will add on additional heparin bolus Check an 8 hr heparin level Daily heparin level and CBC  George Frank, George Frank 09/12/2017,9:59 PM

## 2017-09-12 NOTE — ED Triage Notes (Signed)
Per EMS, pt had sudden onset of dizziness, diaphoresis and weakness. Pt was cool, pale and diaphoretic when EMS arrived. BP 80/60, HR 110, CBG 190, resp 18. EMS gave NS. Last BP 96/60, HR 80 after fluid- pt no longer pale after fluid, pt complaining of pain with inhalation. Pt also complained of CP. EKG showed SR. Neuro intact. Pt recently diagnosed with blood clot in upper left leg.

## 2017-09-12 NOTE — ED Notes (Signed)
As 1st L of ns was finishing, pt stated his headache/ chest pain/sob had almost completely resolved.  Pt was then moved to sitting position for x-ray and pt began c/o chest pain, headache and sob again.

## 2017-09-12 NOTE — ED Notes (Signed)
Contacted lab d/t delayed UA result.  Lab technician initially reported she did not receive the urine, then states that she did.  She's made aware the urine was sent down almost 40 minutes ago.

## 2017-09-12 NOTE — ED Notes (Signed)
PT continues to refuse to allow staff to cath.  MD notified. Will bladder scan and then straight cath.  MD speaking with pt.

## 2017-09-12 NOTE — ED Notes (Signed)
Lanae Boast Hospitalist at bedside to admit pt.

## 2017-09-12 NOTE — ED Notes (Signed)
PT states headache improved, no improvement in sob.  Remains on 2L,

## 2017-09-12 NOTE — ED Notes (Signed)
Pt is eating and drinking without difficulty.  Denies any complaints.  Pt is A&Ox 4.

## 2017-09-12 NOTE — ED Provider Notes (Signed)
[facility]-EMERGENCY DEPT Provider Note   CSN: [id number] Arrival date & time: 09/12/17  1254     History   Chief Complaint Chief Complaint  Patient presents with  . Chest Pain  . Hypotension    HPI George Frank is a 66 y.o. male.  He is here for evaluation of generalized weakness and feeling poorly.  He first noticed it this morning, when he awoke, stood up and lost vision for a few seconds.  He presents by EMS who found him to be hypotensive and treated him with 250 cc saline bolus.  He has had some chills recently but no documented fever.  He denies cough, chest pain, focal weakness or paresthesia.  He is taking his usual medications.  There are no other known modifying factors.    HPI  Past Medical History:  Diagnosis Date  . Anxiety   . Arthritis    "knees" (12/20/2015)  . Aspiration pneumonia (HCC) 12/10/2015  . Depression   . Diabetes mellitus without complication (HCC)   . DVT (deep venous thrombosis) (HCC) 1990s   LLE  . Enlarged heart   . GERD (gastroesophageal reflux disease)   . HCAP (healthcare-associated pneumonia) 12/20/2015  . Hyperlipidemia   . Hypertension   . Kidney stones    "passed them"  . On home oxygen therapy    "2L 24/7 last week; stopped 12/17/2015; dr said I could use it prn; had to use it again 12/19/2015"  . Phlebitis   . Sciatica   . Sciatica of right side   . Sleep apnea   . Stroke (HCC) 12/10/2015   denies residual on 12/20/2015    Patient Active Problem List   Diagnosis Date Noted  . Hyperlipemia 02/23/2017  . Diabetes (HCC) 02/23/2017  . Lacunar infarct, acute (HCC) 02/21/2016  . HCAP (healthcare-associated pneumonia) 12/20/2015  . Illicit drug use 12/20/2015  . Pneumonia 12/20/2015  . Drug abuse   . Acute respiratory failure with hypoxia (HCC) 12/12/2015  . Aspiration pneumonia (HCC) 12/12/2015  . OSA (obstructive sleep apnea) 12/12/2015  . CVA (cerebral infarction) 12/10/2015  . Stroke with cerebral ischemia (HCC)  12/10/2015  . Essential hypertension 12/10/2015  . Leucocytosis 12/10/2015  . Stroke (cerebrum) (HCC) 12/10/2015    Past Surgical History:  Procedure Laterality Date  . FEMUR FRACTURE SURGERY Left 1972  . FRACTURE SURGERY    . KNEE CARTILAGE SURGERY Right    scope  . NASAL SEPTUM SURGERY  ?2000s  . TONSILLECTOMY         Home Medications    Prior to Admission medications   Medication Sig Start Date End Date Taking? Authorizing Provider  aspirin EC 325 MG tablet Take 1 tablet (325 mg total) by mouth daily. 12/12/15   Hongalgi, Maximino Greenland, MD  atorvastatin (LIPITOR) 20 MG tablet Take 1 tablet (20 mg total) by mouth daily at 6 PM. 12/12/15   Hongalgi, Maximino Greenland, MD  B Complex Vitamins (VITAMIN-B COMPLEX) TABS Take by mouth.    [provider]  gabapentin (NEURONTIN) 300 MG capsule Take by mouth.    [provider]  hydrochlorothiazide (MICROZIDE) 12.5 MG capsule Take by mouth.    [provider]  lisinopril (PRINIVIL,ZESTRIL) 20 MG tablet Take 20 mg by mouth daily.    [provider]  metFORMIN (GLUCOPHAGE) 500 MG tablet Take 500 mg by mouth.    [provider]  Oxycodone HCl 20 MG TABS Take 10 mg by mouth 4 (four) times daily as needed (severe pain).  [provider]  pantoprazole (PROTONIX) 40 MG tablet Take 1 tablet (40 mg total) by mouth daily. 12/12/15   Elease Etienne, MD    Family History Family History  Problem Relation Age of Onset  . Stroke Father   . Diabetes Mellitus II Neg Hx     Social History Social History  Substance Use Topics  . Smoking status: Former Smoker    Packs/day: 3.00    Years: 23.00    Types: Cigarettes    Quit date: 10/27/1992  . Smokeless tobacco: Never Used  . Alcohol use No     Comment: 12/20/2015 "I'll have a few drinks once/month"     Allergies   Ivp dye [iodinated diagnostic agents] and Metrizamide   Review of Systems Review of Systems  All other systems reviewed and are  negative.    Physical Exam Updated Vital Signs BP (!) 84/47   Pulse 73   Temp 98.5 F (36.9 C) (Rectal)   Resp 17   Ht  (1.803 m)   Wt 119.3 kg (263 lb)   SpO2 100%   BMI 36.68 kg/m   Physical Exam  Constitutional: He is oriented to person, place, and time. He appears well-developed.  Overweight  HENT:  Head: Normocephalic and atraumatic.  Right Ear: External ear normal.  Left Ear: External ear normal.  Eyes: Pupils are equal, round, and reactive to light. Conjunctivae and EOM are normal.  Neck: Normal range of motion and phonation normal. Neck supple.  Cardiovascular: Normal rate, regular rhythm and normal heart sounds.   Hypotensive  Pulmonary/Chest: Effort normal and breath sounds normal. He exhibits no bony tenderness.  Abdominal: Soft. There is no tenderness.  Musculoskeletal: Normal range of motion.  Neurological: He is alert and oriented to person, place, and time. No cranial nerve deficit or sensory deficit. He exhibits normal muscle tone. Coordination normal.  No dysarthria, aphasia or nystagmus.  No pronator drift.  No ataxia.  Skin: Skin is warm, dry and intact.  Psychiatric: He has a normal mood and affect. His behavior is normal. Judgment and thought content normal.  Nursing note and vitals reviewed.    ED Treatments / Results  Labs (all labs ordered are listed, but only abnormal results are displayed) Labs Reviewed  COMPREHENSIVE METABOLIC PANEL - Abnormal; Notable for the following:       Result Value   CO2 19 (*)    Glucose, Bld 137 (*)    BUN 48 (*)    Creatinine, Ser 1.96 (*)    Calcium 8.6 (*)    Total Protein 6.1 (*)    GFR calc non Af Amer 34 (*)    GFR calc Af Amer 39 (*)    All other components within normal limits  CBC WITH DIFFERENTIAL/PLATELET - Abnormal; Notable for the following:    WBC 17.0 (*)    RBC 3.96 (*)    Hemoglobin 11.1 (*)    HCT 35.0 (*)    Neutro Abs 13.5 (*)    All other components within normal limits    I-STAT CG4 LACTIC ACID, ED - Abnormal; Notable for the following:    Lactic Acid, Venous 2.56 (*)    All other components within normal limits  CULTURE, BLOOD (ROUTINE X 2)  CULTURE, BLOOD (ROUTINE X 2)  URINE CULTURE  URINALYSIS, ROUTINE W REFLEX MICROSCOPIC  I-STAT CG4 LACTIC ACID, ED    BUN  Date Value Ref Range Status  09/12/2017 48 (H) 6 - 20 mg/dL Final  12/21/2015 12 6 - 20 mg/dL Final  16/09/9603 13 6 - 20 mg/dL Final  54/08/8118 12 6 - 20 mg/dL Final   Creatinine, Ser  Date Value Ref Range Status  09/12/2017 1.96 (H) 0.61 - 1.24 mg/dL Final  14/78/2956 2.13 0.61 - 1.24 mg/dL Final  08/65/7846 9.62 0.61 - 1.24 mg/dL Final  95/28/4132 4.40 0.61 - 1.24 mg/dL Final     EKG  EKG Interpretation  Date/Time:  Wednesday September 12 2017 13:07:54 EDT Ventricular Rate:  88 PR Interval:    QRS Duration: 111 QT Interval:  364 QTC Calculation: 441 R Axis:   73 Text Interpretation:  Sinus rhythm Low voltage, precordial leads since last tracing no significant change Confirmed by Mancel Bale [phone]) on 09/12/2017 2:18:22 PM       Radiology Dg Chest Port 1 View  Result Date: 09/12/2017 CLINICAL DATA:  Chest pain, hypotension EXAM: PORTABLE CHEST 1 VIEW COMPARISON:  12/20/2015 FINDINGS: The heart size and mediastinal contours are within normal limits. Both lungs are clear. The visualized skeletal structures are unremarkable. IMPRESSION: No active disease. Electronically Signed   By: Judie Petit.  Shick M.D.   On: 09/12/2017 13:42    Procedures Procedures (including critical care time)  Medications Ordered in ED Medications  sodium chloride 0.9 % bolus 1,000 mL (0 mLs Intravenous Stopped 09/12/17 1334)    And  sodium chloride 0.9 % bolus 1,000 mL (1,000 mLs Intravenous New Bag/Given 09/12/17 1426)    And  sodium chloride 0.9 % bolus 1,000 mL (0 mLs Intravenous Stopped 09/12/17 1515)    And  sodium chloride 0.9 % bolus 500 mL (not administered)     Initial Impression /  Assessment and Plan / ED Course  I have reviewed the triage vital signs and the nursing notes.  Pertinent labs & imaging results that were available during my care of the patient were reviewed by me and considered in my medical decision making (see chart for details).  Clinical Course as of Sep 12 1701  Wed Sep 12, 2017  1655 High Lactic Acid, Venous: (!!) 2.56 [EW]  1655 High WBC: (!) 17.0 [EW]  1655 Low Hemoglobin: (!) 11.1 [EW]  1655 Low CO2: (!) 19 [EW]  1656 High Glucose: (!) 137 [EW]  1656 High BUN: (!) 48 [EW]  1656 Calcium: (!) 8.6 [EW]  1656 High Creatinine: (!) 1.96 [EW]  1656 Low Calcium: (!) 8.6 [EW]  1656 Low GFR, Est Non African American: (!) 34 [EW]    Clinical Course User Index [EW] Mancel Bale, MD     Patient Vitals for the past 24 hrs:  BP Temp Temp src Pulse Resp SpO2 Height Weight  09/12/17 1610 (!) 84/47 - - 73 17 100 % - -  09/12/17 1600 (!) 83/51 - - 74 18 100 % - -  09/12/17 1530 91/62 - - 73 16 100 % - -  09/12/17 1500 (!) 70/46 - - 76 (!) 21 100 % - -  09/12/17 1430 (!) 80/68 - - 72 17 100 % - -  09/12/17 1400 (!) 89/58 - - 72 14 100 % - -  09/12/17 1332 (!) 71/45 - - - (!) 21 - - -  09/12/17 1330 - - - - (!) 21 - - -  09/12/17 1326 (!) 60/34 - - (!) 50 (!) 22 92 % - -  09/12/17 1325 - - - (!) 25 (!) 22 (!) 40 % - -  09/12/17 1322 - 98.5 F (36.9 C) Rectal - - - - -  09/12/17 1321 - 97.7 F (36.5 C) Oral - - - - -  09/12/17 1320 (!) 62/49 - - - 19 - - -  09/12/17 1318 - - - - - -  (1.803 m) 119.3 kg (263 lb)  09/12/17 1315 (!) 56/47 - - 84 19 96 % - -  09/12/17 1310 - - - - (!) 24 - - -    5:07 PM Reevaluation with update and discussion. After initial assessment and treatment, an updated evaluation reveals patient is comfortable.  Blood pressure 101 systolic.  He denies fever.  He states he is hungry and thirsty and not eaten since 3 AM. His primary care doctor is in [place], [place].  He was transferred here, because they were  on diversion.Mancel Bale L   CRITICAL CARE Performed by: Flint Melter Total critical care time: 40 minutes Critical care time was exclusive of separately billable procedures and treating other patients. Critical care was necessary to treat or prevent imminent or life-threatening deterioration. Critical care was time spent personally by me on the following activities: development of treatment plan with patient and/or surrogate as well as nursing, discussions with consultants, evaluation of patient's response to treatment, examination of patient, obtaining history from patient or surrogate, ordering and performing treatments and interventions, ordering and review of laboratory studies, ordering and review of radiographic studies, pulse oximetry and re-evaluation of patient's condition.  Final Clinical Impressions(s) / ED Diagnoses   Final diagnoses:  Hypotension, unspecified hypotension type  AKI (acute kidney injury) (HCC)    Hypotension likely volume related, with mild lactate elevation, nonspecific.  Likely multifactorial related to hyperglycemia, ACE inhibitor use and diuretic use.  Doubt serious bacterial infection, or impending vascular collapse.  Nursing Notes Reviewed/ Care Coordinated Applicable Imaging Reviewed Interpretation of Laboratory Data incorporated into ED treatment  Plan: Recheck lactate, feed patient, and reassess  New Prescriptions New Prescriptions   No medications on file     Mancel Bale, MD 09/12/17 1708

## 2017-09-12 NOTE — H&P (Signed)
History and Physical    George Frank QIO:[id number] DOB: 05-17-1951 DOA: 09/12/2017  PCP: Dennard Schaumann, MD  Patient coming from: Home  I have personally briefly reviewed patient's old medical records in [facility]  Chief Complaint: Chest pain, hypotension  HPI: George Frank is a 66 y.o. male with medical history significant of DVT in proximal vein of LLE diagnosed on 8/13 at [facility].  Patient was started on Eliquis, which he took for 15 days before switching to coumadin (secondary to insurance reasons).  This morning patient had sudden onset of dizziness, diaphoresis, weakness.  EMS was called.  Initial BP 80/60 with HR of 110.  Given 750 cc NS.  Patient also had associated chest pain with deep breaths at that time.   ED Course: Got 3.5L NS, BP improved to 110s systolic, HR 70s, CP has resolved.   Review of Systems: As per HPI otherwise 10 point review of systems negative.   Past Medical History:  Diagnosis Date  . Anxiety   . Arthritis    "knees" (12/20/2015)  . Aspiration pneumonia (HCC) 12/10/2015  . Depression   . Diabetes mellitus without complication (HCC)   . DVT (deep venous thrombosis) (HCC) 1990s   LLE  . Enlarged heart   . GERD (gastroesophageal reflux disease)   . HCAP (healthcare-associated pneumonia) 12/20/2015  . Hyperlipidemia   . Hypertension   . Kidney stones    "passed them"  . On home oxygen therapy    "2L 24/7 last week; stopped 12/17/2015; dr said I could use it prn; had to use it again 12/19/2015"  . Phlebitis   . Sciatica   . Sciatica of right side   . Sleep apnea   . Stroke (HCC) 12/10/2015   denies residual on 12/20/2015    Past Surgical History:  Procedure Laterality Date  . FEMUR FRACTURE SURGERY Left 1972  . FRACTURE SURGERY    . KNEE CARTILAGE SURGERY Right    scope  . NASAL SEPTUM SURGERY  ?2000s  . TONSILLECTOMY       reports that he quit smoking about 24 years ago. His smoking use included Cigarettes. He has a 69.00  pack-year smoking history. He has never used smokeless tobacco. He reports that he does not drink alcohol or use drugs.  Allergies  Allergen Reactions  . Ivp Dye [Iodinated Diagnostic Agents] Anaphylaxis and Other (See Comments)    RESPIRATORY ARREST AND Pt reports serious reaction in 1972 at [facility] (PATIENT WAS TOLD THAT IF IT WAS ADMINISTERED TO HIM AGAIN HE "WILL DIE")  . Metrizamide Anaphylaxis and Other (See Comments)    RESPIRATORY ARREST AND Pt reports serious reaction in 1972 at [facility] (PATIENT WAS TOLD THAT IF IT WAS ADMINISTERED TO HIM AGAIN HE "WILL DIE")  . Zoloft [Sertraline Hcl] Diarrhea and Other (See Comments)    Also hallucinations, made patient feel trapped, and increased anxiety    Family History  Problem Relation Age of Onset  . Stroke Father   . Diabetes Mellitus II Neg Hx      Prior to Admission medications   Medication Sig Start Date End Date Taking? Authorizing Provider  aspirin EC 325 MG tablet Take 1 tablet (325 mg total) by mouth daily. Patient taking differently: Take 325 mg by mouth every evening.  12/12/15  Yes Hongalgi, Maximino Greenland, MD  atorvastatin (LIPITOR) 20 MG tablet Take 1 tablet (20 mg total) by mouth daily at 6 PM. Patient taking differently: Take 20 mg by mouth daily.  12/12/15  Yes Hongalgi, Maximino Greenland, MD  busPIRone (BUSPAR) 5 MG tablet Take 5 mg by mouth 2 (two) times daily.   Yes [provider]  gabapentin (NEURONTIN) 300 MG capsule Take 300 mg by mouth 2 (two) times daily.    Yes [provider]  hydrOXYzine (ATARAX/VISTARIL) 25 MG tablet Take 25 mg by mouth 3 (three) times daily. 06/22/17  Yes [provider]  lisinopril (PRINIVIL,ZESTRIL) 20 MG tablet Take 20 mg by mouth daily.   Yes [provider]  metFORMIN (GLUCOPHAGE) 1000 MG tablet Take 1,000 mg by mouth 2 (two) times daily.   Yes [provider]  pantoprazole (PROTONIX) 40 MG tablet Take 1 tablet (40 mg total) by mouth  daily. 12/12/15  Yes Hongalgi, Maximino Greenland, MD  warfarin (COUMADIN) 5 MG tablet Take 5 mg by mouth daily at 6 PM.   Yes [provider]    Physical Exam: Vitals:   09/12/17 2115 09/12/17 2130 09/12/17 2145 09/12/17 2200  BP: 110/77 106/67 95/66 114/73  Pulse: 76 80 75 75  Resp: Temp:      TempSrc:      SpO2: 100% 99% 100% 100%  Weight:      Height:        Constitutional: NAD, calm, comfortable Eyes: PERRL, lids and conjunctivae normal ENMT: Mucous membranes are moist. Posterior pharynx clear of any exudate or lesions.Normal dentition.  Neck: normal, supple, no masses, no thyromegaly Respiratory: clear to auscultation bilaterally, no wheezing, no crackles. Normal respiratory effort. No accessory muscle use.  Cardiovascular: Regular rate and rhythm, no murmurs / rubs / gallops. No extremity edema. 2+ pedal pulses. No carotid bruits.  Abdomen: no tenderness, no masses palpated. No hepatosplenomegaly. Bowel sounds positive.  Musculoskeletal: no clubbing / cyanosis. No joint deformity upper and lower extremities. Good ROM, no contractures. Normal muscle tone.  Skin: no rashes, lesions, ulcers. No induration Neurologic: CN 2-12 grossly intact. Sensation intact, DTR normal. Strength 5/5 in all 4.  Psychiatric: Normal judgment and insight. Alert and oriented x 3. Normal mood.    Labs on Admission: I have personally reviewed following labs and imaging studies  CBC:  Recent Labs Lab 09/12/17 1313  WBC 17.0*  NEUTROABS 13.5*  HGB 11.1*  HCT 35.0*  MCV 88.4  PLT 275   Basic Metabolic Panel:  Recent Labs Lab 09/12/17 1313  NA 139  K 4.3  CL 108  CO2 19*  GLUCOSE 137*  BUN 48*  CREATININE 1.96*  CALCIUM 8.6*   GFR: Estimated Creatinine Clearance: 48.7 mL/min (A) (by C-G formula based on SCr of 1.96 mg/dL (H)). Liver Function Tests:  Recent Labs Lab 09/12/17 1313  AST 20  ALT 18  ALKPHOS 44  BILITOT 0.4  PROT 6.1*  ALBUMIN 3.5   No results  for input(s): LIPASE, AMYLASE in the last 168 hours. No results for input(s): AMMONIA in the last 168 hours. Coagulation Profile:  Recent Labs Lab 09/12/17 2200  INR PENDING   Cardiac Enzymes: No results for input(s): CKTOTAL, CKMB, CKMBINDEX, TROPONINI in the last 168 hours. BNP (last 3 results) No results for input(s): PROBNP in the last 8760 hours. HbA1C: No results for input(s): HGBA1C in the last 72 hours. CBG: No results for input(s): GLUCAP in the last 168 hours. Lipid Profile: No results for input(s): CHOL, HDL, LDLCALC, TRIG, CHOLHDL, LDLDIRECT in the last 72 hours. Thyroid Function Tests: No results for input(s): TSH, T4TOTAL, FREET4, T3FREE, THYROIDAB in the last 72  hours. Anemia Panel: No results for input(s): VITAMINB12, FOLATE, FERRITIN, TIBC, IRON, RETICCTPCT in the last 72 hours. Urine analysis:    Component Value Date/Time   COLORURINE YELLOW 09/12/2017 1958   APPEARANCEUR CLEAR 09/12/2017 1958   LABSPEC 1.017 09/12/2017 1958   PHURINE 5.0 09/12/2017 1958   GLUCOSEU NEGATIVE 09/12/2017 1958   HGBUR NEGATIVE 09/12/2017 1958   BILIRUBINUR NEGATIVE 09/12/2017 1958   KETONESUR NEGATIVE 09/12/2017 1958   PROTEINUR NEGATIVE 09/12/2017 1958   NITRITE NEGATIVE 09/12/2017 1958   LEUKOCYTESUR NEGATIVE 09/12/2017 1958    Radiological Exams on Admission: Dg Chest Port 1 View  Result Date: 09/12/2017 CLINICAL DATA:  Chest pain, hypotension EXAM: PORTABLE CHEST 1 VIEW COMPARISON:  12/20/2015 FINDINGS: The heart size and mediastinal contours are within normal limits. Both lungs are clear. The visualized skeletal structures are unremarkable. IMPRESSION: No active disease. Electronically Signed   By: Judie Petit.  Shick M.D.   On: 09/12/2017 13:42    EKG: Independently reviewed.  Assessment/Plan Principal Problem:   Hypotension Active Problems:   Essential hypertension   Acute deep vein thrombosis (DVT) of proximal vein of left lower extremity (HCC)   AKI (acute kidney  injury) (HCC)    1. Hypotension - given HPI, strongly suspect that the known LLE DVT that he was diagnosed with last month has embolized this morning causing PE with acute cor pulmonale.  Initially massive now possibly sub-massive with improvement in BPs over course of ED visit. 1. INR pending 2. Coumadin per pharm 3. Starting heparin gtt regardless given the high suspicion for reasons as described above 4. Unable to obtain CTA due to: 1. Anaphylaxis to IV contrast dye: severe reaction including respiratory arrest and nearly cardiac arrest with administration at [facility] in 1972 2. Also has mild AKI 5. VQ scan in AM 6. 2d echo in AM 7. Tele monitor 8. DDx includes a fluid responsive cause of hypotension 1. though no source of infection was found 2. HGB 11.1, no stigmata of bleed 2. AKI - likely secondary to hypotension 1. Holding BP meds 2. IVF: 3.5L bolus in ED 3. Repeat BMP in AM 3. HTN - holding BP meds 4. DM2 - 1. Hold metformin 2. Sensitive SSI Q4H  DVT prophylaxis: Heparin gtt Code Status: Full Family Communication: No family in room Disposition Plan: Home after admit Consults called: None Admission status: Admit to inpatient - as mentioned above    Hillary Bow. DO [facility] Pager [phone]  If 7AM-7PM, please contact day team taking care of patient [email or web address] Password [facility]  09/12/2017, 10:42 PM

## 2017-09-13 ENCOUNTER — Inpatient Hospital Stay (HOSPITAL_COMMUNITY): Payer: Medicare Other

## 2017-09-13 DIAGNOSIS — D649 Anemia, unspecified: Secondary | ICD-10-CM | POA: Clinically undetermined

## 2017-09-13 LAB — BASIC METABOLIC PANEL
ANION GAP: 5 (ref 5–15)
BUN: 28 mg/dL — ABNORMAL HIGH (ref 6–20)
CHLORIDE: 114 mmol/L — AB (ref 101–111)
CO2: 22 mmol/L (ref 22–32)
Calcium: 7.6 mg/dL — ABNORMAL LOW (ref 8.9–10.3)
Creatinine, Ser: 1.07 mg/dL (ref 0.61–1.24)
Glucose, Bld: 89 mg/dL (ref 65–99)
POTASSIUM: 3.8 mmol/L (ref 3.5–5.1)
SODIUM: 141 mmol/L (ref 135–145)

## 2017-09-13 LAB — HEMOGLOBIN A1C
Hgb A1c MFr Bld: 5.7 % — ABNORMAL HIGH (ref 4.8–5.6)
MEAN PLASMA GLUCOSE: 116.89 mg/dL

## 2017-09-13 LAB — IRON AND TIBC
IRON: 41 ug/dL — AB (ref 45–182)
SATURATION RATIOS: 16 % — AB (ref 17.9–39.5)
TIBC: 259 ug/dL (ref 250–450)
UIBC: 218 ug/dL

## 2017-09-13 LAB — CBC
HCT: 28.5 % — ABNORMAL LOW (ref 39.0–52.0)
HEMOGLOBIN: 9.1 g/dL — AB (ref 13.0–17.0)
MCH: 28.2 pg (ref 26.0–34.0)
MCHC: 31.9 g/dL (ref 30.0–36.0)
MCV: 88.2 fL (ref 78.0–100.0)
PLATELETS: 208 10*3/uL (ref 150–400)
RBC: 3.23 MIL/uL — AB (ref 4.22–5.81)
RDW: 15 % (ref 11.5–15.5)
WBC: 13.5 10*3/uL — AB (ref 4.0–10.5)

## 2017-09-13 LAB — FOLATE: Folate: 8.4 ng/mL (ref >5.9)

## 2017-09-13 LAB — TROPONIN I

## 2017-09-13 LAB — FERRITIN: FERRITIN: 35 ng/mL (ref 24–336)

## 2017-09-13 LAB — CBG MONITORING, ED
GLUCOSE-CAPILLARY: 157 mg/dL — AB (ref 65–99)
GLUCOSE-CAPILLARY: 80 mg/dL (ref 65–99)

## 2017-09-13 LAB — GLUCOSE, CAPILLARY
GLUCOSE-CAPILLARY: 87 mg/dL (ref 65–99)
GLUCOSE-CAPILLARY: 84 mg/dL (ref 65–99)
Glucose-Capillary: 110 mg/dL — ABNORMAL HIGH (ref 65–99)
Glucose-Capillary: 132 mg/dL — ABNORMAL HIGH (ref 65–99)
Glucose-Capillary: 83 mg/dL (ref 65–99)

## 2017-09-13 LAB — ECHOCARDIOGRAM COMPLETE
Height: 71.5 in
WEIGHTICAEL: 4098.79 [oz_av]

## 2017-09-13 LAB — VITAMIN B12: Vitamin B-12: 475 pg/mL (ref 180–914)

## 2017-09-13 LAB — PROTIME-INR
INR: 4.51 — AB
PROTHROMBIN TIME: 42.5 s — AB (ref 11.4–15.2)

## 2017-09-13 LAB — CORTISOL: CORTISOL PLASMA: 4.9 ug/dL

## 2017-09-13 LAB — MRSA PCR SCREENING: MRSA BY PCR: NEGATIVE

## 2017-09-13 MED ORDER — SODIUM CHLORIDE 0.9 % IV SOLN
INTRAVENOUS | Status: DC
  Administered 2017-09-13 – 2017-09-14 (×4): via INTRAVENOUS

## 2017-09-13 MED ORDER — TECHNETIUM TC 99M DIETHYLENETRIAME-PENTAACETIC ACID: 32.0000 | Freq: Once | INTRAVENOUS | Status: DC | PRN

## 2017-09-13 MED ORDER — SODIUM CHLORIDE 0.9 % IV SOLN: Freq: Once | INTRAVENOUS | Status: DC

## 2017-09-13 MED ORDER — TECHNETIUM TO 99M ALBUMIN AGGREGATED: 4.2000 | Freq: Once | INTRAVENOUS | Status: DC | PRN

## 2017-09-13 NOTE — Progress Notes (Signed)
ANTICOAGULATION CONSULT NOTE - Follow Up Consult  Pharmacy Consult for Coumadin Indication: DVT  Allergies  Allergen Reactions  . Ivp Dye [Iodinated Diagnostic Agents] Anaphylaxis and Other (See Comments)    RESPIRATORY ARREST AND Pt reports serious reaction in 1972 at [facility] (PATIENT WAS TOLD THAT IF IT WAS ADMINISTERED TO HIM AGAIN HE "WILL DIE")  . Metrizamide Anaphylaxis and Other (See Comments)    RESPIRATORY ARREST AND Pt reports serious reaction in 1972 at [facility] (PATIENT WAS TOLD THAT IF IT WAS ADMINISTERED TO HIM AGAIN HE "WILL DIE")  . Zoloft [Sertraline Hcl] Diarrhea and Other (See Comments)    Also hallucinations, made patient feel trapped, and increased anxiety    Patient Measurements: Height: 5' 11.5" (181.6 cm) Weight: 256 lb 2.8 oz (116.2 kg) IBW/kg (Calculated) : 76.45  Vital Signs: Temp: 97.7 F (36.5 C) (09/20 0817) Temp Source: Oral (09/20 0817) BP: 106/69 (09/20 0817) Pulse Rate: 69 (09/20 0817)  Labs:  Recent Labs  09/12/17 1313 09/12/17 2200 09/13/17 0155 09/13/17 0215  HGB 11.1*  --  9.1*  --   HCT 35.0*  --  28.5*  --   PLT 275  --  208  --   LABPROT  --  39.6*  --  42.5*  INR  --  4.12*  --  4.51*  CREATININE 1.96*  --   --   --     Estimated Creatinine Clearance: 48.5 mL/min (A) (by C-G formula based on SCr of 1.96 mg/dL (H)).   Medications:  Coumadin   Assessment: 66 year old male who was started on anticoagulation in August for DVT.  He was transitioned from Eliquis to Coumadin and per his report has been therapeutic on Coumadin.  He was admitted 9/19 with shortness of breath and possible PE. He was started on heparin which was stopped due to INR >4.  Today his INR remains supratherapeutic. He has a VQ scan ordered for further workup of possible PE. Will hold his Coumadin for now and follow closely for any changes in anticoagulation needs based on the findings of his tests.  Goal of Therapy:  INR 2-3 Monitor  platelets by anticoagulation protocol: Yes   Plan:  No Coumadin today Daily PT/INR Follow with medical team for any anticoagulation changes based on test results  Sallee Provencal 09/13/2017,8:25 AM

## 2017-09-13 NOTE — Progress Notes (Addendum)
Surprisingly coumadin is supratherapeutic with INR 4.1.  Of note patient did have 1 week gap between ending eliquis and beginning coumadin and no bridge for coumadin he states.  Also of note he apparently had additional clot present on repeat US of leg 2 weeks ago at PCPs office.  INRs have been therapeutic during the time he has been on coumadin (2.2 and 2.5 most recently in office).  I remain suspicious of PE based on presentation: 1) Will hold heparin given supratheraputic INR of 4.1 at this time 2) CBC repeat now 3) VQ scan at 0700 ASAP 4) NS at 125 cc/hr 5) Sepsis is again considered: BCx pending, but no source / symptoms suggesting source of infection, no SIRS other than WBC currently.   Clinically patient remains stable and asymptomatic at this time.

## 2017-09-13 NOTE — ED Notes (Signed)
Dr. Julian Reil discontinued Heparin drip , updated on pt.'s hypotension - he ordered NS IV 125 ml/hr.

## 2017-09-13 NOTE — Progress Notes (Signed)
Hemoglobin 2gm drop noted, no stigmata or known bleed.  Likely dilutional from the 3.5L fluid he received since initial CBC.

## 2017-09-13 NOTE — ED Notes (Signed)
Dr. Julian Reil notified no pt.'s CBC results .

## 2017-09-13 NOTE — Progress Notes (Signed)
  Echocardiogram 2D Echocardiogram has been performed.  Nolon Rod 09/13/2017, 10:10 AM

## 2017-09-13 NOTE — Progress Notes (Signed)
PROGRESS NOTE    George Frank  OZH:[id number] DOB: 1951-02-10 DOA: 09/12/2017 PCP: Dennard Schaumann, MD    Brief Narrative:  George Frank is a 66 y.o. male with medical history significant of DVT in proximal vein of LLE diagnosed on 8/13 at [facility].  Patient was started on Eliquis, which he took for 15 days before switching to coumadin (secondary to insurance reasons).  This morning patient had sudden onset of dizziness, diaphoresis, weakness.  EMS was called.  Initial BP 80/60 with HR of 110.  Given 750 cc NS.  Patient also had associated chest pain with deep breaths at that time.   ED Course: Got 3.5L NS, BP improved to 110s systolic, HR 70s, CP has resolved.   Assessment & Plan:   Principal Problem:   Hypotension Active Problems:   Essential hypertension   Diabetes (HCC)   Acute deep vein thrombosis (DVT) of proximal vein of left lower extremity (HCC)   AKI (acute kidney injury) (HCC)   Anemia  #1 hypotension Questionable etiology. Differential includes PE secondary to recent left lower extremity DVT versus hypovolemic hypotension versus infectious etiology versus adrenal insufficiency. Patient with no overt bleeding. Hemoglobin at 9.1 from 11.1 however likely a dilutional effect. Blood pressure responding to IV fluids. Urinalysis unremarkable. Chest x-ray negative for any infiltrate. Blood cultures pending. BNP 17.7. Check a cortisol level. Cycle cardiac enzymes every 6 hours 3. 2-D echo pending. VQ scan pending. Continue IV fluids. Follow.  #2 recent acute DVT proximal left lower extremity Patient recently diagnosed with proximal vein left lower extremity DVT 08/06/2017 at [facility] and started on ElIQUIS which was subsequently switched to Coumadin due to insurance issues. Patient had a gap week in between anticoagulations. Patient had no bridge for Coumadin. INRs have been therapeutic on Coumadin. Patient unable to get CT angiogram chest due to allergy to IV dye. VQ  scan pending. 2-D echo pending. INR is supratherapeutic at as such Coumadin on hold and heparin not started. Continue IV fluids. Follow.  #3 hypertension Continue to hold antihypertensive medications.  #4 acute kidney injury/acute renal failure Likely secondary to prerenal azotemia. Renal function improving with hydration. Follow.  #5 diabetes mellitus Check a hemoglobin A1c. Patient currently nothing by mouth. Continue sliding scale insulin.  #6 anemia Likely dilutional anemia. Patient with no overt bleeding. Check an anemia panel. Follow H&H.   DVT prophylaxis: Supratherapeutic INR Code Status: Full Family Communication: Updated patient. No family at bedside. Disposition Plan: Remain in step down unit.   Consultants:   None  Procedures:   Chest x-ray 09/12/2017  2-D echo pending 09/13/2017  VQ scan pending  Antimicrobials:   None   Subjective: Patient laying in bed. Patient has just had 2-D echo done. Denies any chest pain. No shortness of breath. No dizziness. No lightheadedness. Feels much better. Patient denies any bleeding.  Objective: Vitals:   09/13/17 0445 09/13/17 0500 09/13/17 0600 09/13/17 0817  BP: 102/70 105/62 101/69 106/69  Pulse: 72 68 72 69  Resp: (!) (!) 24  Temp:   97.8 F (36.6 C) 97.7 F (36.5 C)  TempSrc:   Oral Oral  SpO2: 100% 100% 100% 100%  Weight:   116.2 kg (256 lb 2.8 oz)   Height:   5' 11.5" (1.816 m)     Intake/Output Summary (Last 24 hours) at 09/13/17 0957 Last data filed at 09/13/17 0600  Gross per 24 hour  Intake          3510.42  ml  Output              675 ml  Net          2835.42 ml   Filed Weights   09/12/17 1318 09/13/17 0600  Weight: 119.3 kg (263 lb) 116.2 kg (256 lb 2.8 oz)    Examination:  General exam: Appears calm and comfortable  Respiratory system: Clear to auscultation. No wheezes, no crackles, no rhonchi. Respiratory effort normal. Cardiovascular system: S1 & S2 heard, RRR. No JVD,  murmurs, rubs, gallops or clicks. No pedal edema. Gastrointestinal system: Abdomen is nondistended, soft and nontender. No organomegaly or masses felt. Normal bowel sounds heard. Central nervous system: Alert and oriented. No focal neurological deficits. Extremities: Left lower extremity bigger than right lower extremity. Skin: No rashes, lesions or ulcers Psychiatry: Judgement and insight appear normal. Mood & affect appropriate.     Data Reviewed: I have personally reviewed following labs and imaging studies  CBC:  Recent Labs Lab 09/12/17 1313 09/13/17 0155  WBC 17.0* 13.5*  NEUTROABS 13.5*  --   HGB 11.1* 9.1*  HCT 35.0* 28.5*  MCV 88.4 88.2  PLT 275 208   Basic Metabolic Panel:  Recent Labs Lab 09/12/17 1313 09/13/17 0726  NA 139 141  K 4.3 3.8  CL 108 114*  CO2 19* 22  GLUCOSE 137* 89  BUN 48* 28*  CREATININE 1.96* 1.07  CALCIUM 8.6* 7.6*   GFR: Estimated Creatinine Clearance: 88.8 mL/min (by C-G formula based on SCr of 1.07 mg/dL). Liver Function Tests:  Recent Labs Lab 09/12/17 1313  AST 20  ALT 18  ALKPHOS 44  BILITOT 0.4  PROT 6.1*  ALBUMIN 3.5   No results for input(s): LIPASE, AMYLASE in the last 168 hours. No results for input(s): AMMONIA in the last 168 hours. Coagulation Profile:  Recent Labs Lab 09/12/17 2200 09/13/17 0215  INR 4.12* 4.51*   Cardiac Enzymes: No results for input(s): CKTOTAL, CKMB, CKMBINDEX, TROPONINI in the last 168 hours. BNP (last 3 results) No results for input(s): PROBNP in the last 8760 hours. HbA1C: No results for input(s): HGBA1C in the last 72 hours. CBG:  Recent Labs Lab 09/13/17 0010 09/13/17 0444 09/13/17 0814  GLUCAP 157* 80 83   Lipid Profile: No results for input(s): CHOL, HDL, LDLCALC, TRIG, CHOLHDL, LDLDIRECT in the last 72 hours. Thyroid Function Tests: No results for input(s): TSH, T4TOTAL, FREET4, T3FREE, THYROIDAB in the last 72 hours. Anemia Panel: No results for input(s):  VITAMINB12, FOLATE, FERRITIN, TIBC, IRON, RETICCTPCT in the last 72 hours. Sepsis Labs:  Recent Labs Lab 09/12/17 1334 09/12/17 1735  LATICACIDVEN 2.56* 1.23    No results found for this or any previous visit (from the past 240 hour(s)).       Radiology Studies: Dg Chest Port 1 View  Result Date: 09/12/2017 CLINICAL DATA:  Chest pain, hypotension EXAM: PORTABLE CHEST 1 VIEW COMPARISON:  12/20/2015 FINDINGS: The heart size and mediastinal contours are within normal limits. Both lungs are clear. The visualized skeletal structures are unremarkable. IMPRESSION: No active disease. Electronically Signed   By: Judie Petit.  Shick M.D.   On: 09/12/2017 13:42        Scheduled Meds: . aspirin EC  325 mg Oral QPM  . atorvastatin  20 mg Oral Daily  . busPIRone  5 mg Oral BID  . gabapentin  300 mg Oral BID  . hydrOXYzine  25 mg Oral TID  . insulin aspart  0-9 Units Subcutaneous Q4H  . pantoprazole  40 mg Oral Daily  . Warfarin - Pharmacist Dosing Inpatient   Does not apply q1800   Continuous Infusions: . sodium chloride 125 mL/hr at 09/13/17 0155     LOS: 1 day    Time spent: 35 mins    THOMPSON,DANIEL, MD [facility] Pager [phone]  If 7PM-7AM, please contact night-coverage [email or web address] Password [email or web address] 09/13/2017, 9:57 AM

## 2017-09-13 NOTE — ED Notes (Signed)
Informed Gardener, MD of patient's elevated INR.

## 2017-09-13 NOTE — ED Notes (Signed)
Attempted to call report

## 2017-09-13 NOTE — Plan of Care (Signed)
Problem: Education: Goal: Knowledge of [facility] Education information/materials will improve Outcome: Progressing Patient oriented to unit and hospital. Discussed plan of care for VQ scan this morning. Patient agreeable to plan. Patient denies pain at this time. Discussed vaccines and patient would like to hold off on flu shot at this time.   Problem: Safety: Goal: Ability to remain free from injury will improve Outcome: Progressing Patient alert, oriented, and able to follow commands. Room uncluttered and patient aware of how to use call light for assistance. Bed alarm utilized.   Problem: Physical Regulation: Goal: Ability to maintain clinical measurements within normal limits will improve Outcome: Progressing Patient admitted with hypotension, which has resolved. Patient being monitored in stepdown until VQ scan.   Problem: Skin Integrity: Goal: Risk for impaired skin integrity will decrease Outcome: Progressing Patient has psoriasis plaques scattered over body. This is the only evidence of skin breakdown at this time.   Problem: Tissue Perfusion: Goal: Risk factors for ineffective tissue perfusion will decrease Outcome: Progressing Patient takes coumadin outpatient and is supratherapeutic.   Problem: Activity: Goal: Risk for activity intolerance will decrease Outcome: Progressing Patient able to ambulate to bathroom.   Problem: Bowel/Gastric: Goal: Will not experience complications related to bowel motility Outcome: Progressing Patient states he had bowel movement yesterday.

## 2017-09-13 NOTE — ED Notes (Signed)
Dr. Julian Reil advised nurse that VQ scan can be done in the morning .

## 2017-09-14 ENCOUNTER — Inpatient Hospital Stay (HOSPITAL_COMMUNITY): Payer: Medicare Other

## 2017-09-14 DIAGNOSIS — I959 Hypotension, unspecified: Secondary | ICD-10-CM

## 2017-09-14 LAB — BASIC METABOLIC PANEL
Anion gap: 4 — ABNORMAL LOW (ref 5–15)
BUN: 15 mg/dL (ref 6–20)
CALCIUM: 7.9 mg/dL — AB (ref 8.9–10.3)
CHLORIDE: 113 mmol/L — AB (ref 101–111)
CO2: 23 mmol/L (ref 22–32)
CREATININE: 1.02 mg/dL (ref 0.61–1.24)
GFR calc non Af Amer: >60 mL/min (ref >60)
Glucose, Bld: 102 mg/dL — ABNORMAL HIGH (ref 65–99)
Potassium: 3.8 mmol/L (ref 3.5–5.1)
Sodium: 140 mmol/L (ref 135–145)

## 2017-09-14 LAB — CBC WITH DIFFERENTIAL/PLATELET
BASOS PCT: 0 %
Basophils Absolute: 0 10*3/uL (ref 0.0–0.1)
EOS ABS: 0.5 10*3/uL (ref 0.0–0.7)
Eosinophils Relative: 5 %
HEMATOCRIT: 26.4 % — AB (ref 39.0–52.0)
Hemoglobin: 8.6 g/dL — ABNORMAL LOW (ref 13.0–17.0)
Lymphocytes Relative: 29 %
Lymphs Abs: 2.7 10*3/uL (ref 0.7–4.0)
MCH: 29.1 pg (ref 26.0–34.0)
MCHC: 32.6 g/dL (ref 30.0–36.0)
MCV: 89.2 fL (ref 78.0–100.0)
MONO ABS: 0.5 10*3/uL (ref 0.1–1.0)
MONOS PCT: 5 %
Neutro Abs: 5.5 10*3/uL (ref 1.7–7.7)
Neutrophils Relative %: 61 %
Platelets: 207 10*3/uL (ref 150–400)
RBC: 2.96 MIL/uL — ABNORMAL LOW (ref 4.22–5.81)
RDW: 15.1 % (ref 11.5–15.5)
WBC: 9.1 10*3/uL (ref 4.0–10.5)

## 2017-09-14 LAB — PROTIME-INR
INR: 2.73
PROTHROMBIN TIME: 28.7 s — AB (ref 11.4–15.2)

## 2017-09-14 LAB — GLUCOSE, CAPILLARY
GLUCOSE-CAPILLARY: 100 mg/dL — AB (ref 65–99)
GLUCOSE-CAPILLARY: 171 mg/dL — AB (ref 65–99)
Glucose-Capillary: 96 mg/dL (ref 65–99)
Glucose-Capillary: 105 mg/dL — ABNORMAL HIGH (ref 65–99)
Glucose-Capillary: 138 mg/dL — ABNORMAL HIGH (ref 65–99)

## 2017-09-14 LAB — URINE CULTURE: Culture: NO GROWTH

## 2017-09-14 MED ORDER — FLUDROCORTISONE ACETATE 0.1 MG PO TABS
0.0500 mg | ORAL_TABLET | Freq: Every day | ORAL | Status: DC
  Administered 2017-09-14: 0.5 mg via ORAL
  Administered 2017-09-15: 0.05 mg via ORAL
  Filled 2017-09-14 (×2): qty 0.5

## 2017-09-14 MED ORDER — DEXAMETHASONE SODIUM PHOSPHATE 4 MG/ML IJ SOLN
2.0000 mg | Freq: Four times a day (QID) | INTRAMUSCULAR | Status: DC
  Administered 2017-09-14 – 2017-09-15 (×4): 2 mg via INTRAVENOUS
  Filled 2017-09-14 (×4): qty 1

## 2017-09-14 MED ORDER — COSYNTROPIN 0.25 MG IJ SOLR
0.2500 mg | Freq: Once | INTRAMUSCULAR | Status: AC
  Administered 2017-09-15: 0.25 mg via INTRAVENOUS
  Filled 2017-09-14: qty 0.25

## 2017-09-14 MED ORDER — WARFARIN SODIUM 5 MG PO TABS
2.5000 mg | ORAL_TABLET | Freq: Once | ORAL | Status: AC
  Administered 2017-09-14: 2.5 mg via ORAL
  Filled 2017-09-14: qty 1

## 2017-09-14 NOTE — Progress Notes (Signed)
ANTICOAGULATION CONSULT NOTE - Follow Up Consult  Pharmacy Consult for Coumadin Indication: DVT  Allergies  Allergen Reactions  . Ivp Dye [Iodinated Diagnostic Agents] Anaphylaxis and Other (See Comments)    RESPIRATORY ARREST AND Pt reports serious reaction in 1972 at [facility] (PATIENT WAS TOLD THAT IF IT WAS ADMINISTERED TO HIM AGAIN HE "WILL DIE")  . Metrizamide Anaphylaxis and Other (See Comments)    RESPIRATORY ARREST AND Pt reports serious reaction in 1972 at [facility] (PATIENT WAS TOLD THAT IF IT WAS ADMINISTERED TO HIM AGAIN HE "WILL DIE")  . Zoloft [Sertraline Hcl] Diarrhea and Other (See Comments)    Also hallucinations, made patient feel trapped, and increased anxiety    Patient Measurements: Height: 5' 11.5" (181.6 cm) Weight: 256 lb 2.8 oz (116.2 kg) IBW/kg (Calculated) : 76.45  Vital Signs: Temp: 97.8 F (36.6 C) (09/21 0433) Temp Source: Oral (09/21 0433) BP: 120/82 (09/21 0600) Pulse Rate: 67 (09/21 0600)  Labs:  Recent Labs  09/12/17 1313 09/12/17 2200 09/13/17 0155 09/13/17 0215 09/13/17 0726 09/13/17 1032 09/13/17 2022 09/14/17 0233  HGB 11.1*  --  9.1*  --   --   --   --  8.6*  HCT 35.0*  --  28.5*  --   --   --   --  26.4*  PLT 275  --  208  --   --   --   --  207  LABPROT  --  39.6*  --  42.5*  --   --   --  28.7*  INR  --  4.12*  --  4.51*  --   --   --  2.73  CREATININE 1.96*  --   --   --  1.07  --   --  1.02  TROPONINI  --   --   --   --   --  <0.03 <0.03  --     Estimated Creatinine Clearance: 93.1 mL/min (by C-G formula based on SCr of 1.02 mg/dL).   Medications:  Coumadin   Assessment: 66 year old male who was started on anticoagulation in August for DVT.  He was transitioned from Eliquis to Coumadin and per his report has been therapeutic on Coumadin.  He was admitted 9/19 with shortness of breath and possible PE. He was started on heparin which was stopped due to INR >4.   VQ low prob of acute PE  INR now  2.73 Cbc stable  Home dose reported as 5 mg daily  Goal of Therapy:  INR 2-3 Monitor platelets by anticoagulation protocol: Yes   Plan:  Warfarin 2.5 mg x 1 Daily INR  Isaac Bliss, PharmD, BCPS, BCCCP Clinical Pharmacist Clinical phone for 09/14/2017 from 7a-3:30p: [phone] If after 3:30p, please call main pharmacy at: [phone] 09/14/2017 7:55 AM

## 2017-09-14 NOTE — Progress Notes (Signed)
   09/14/17 1100  Clinical Encounter Type  Visited With Patient  Visit Type Initial;Spiritual support  Spiritual Encounters  Spiritual Needs Prayer  Stress Factors  Patient Stress Factors None identified  Family Stress Factors None identified  Initial visit with patient. He shared about his history having a stroke and now visits hospital once a week. Getting ready to go to step down unit. Enjoyed conversation with patient. Asked if patient would like prayer and he said yes. He shared he hoped I would see lots of folks today. Phebe Colla, Chaplain

## 2017-09-14 NOTE — Progress Notes (Signed)
PROGRESS NOTE    George Frank  ZOX:[id number] DOB: [birth year] DOA: 09/12/2017 PCP: Dennard Schaumann, MD    Brief Narrative:  George Frank is a 66 y.o. male with medical history significant of DVT in proximal vein of LLE diagnosed on 8/13 at [facility].  Patient was started on Eliquis, which he took for 15 days before switching to coumadin (secondary to insurance reasons).  This morning patient had sudden onset of dizziness, diaphoresis, weakness.  EMS was called.  Initial BP 80/60 with HR of 110.  Given 750 cc NS.  Patient also had associated chest pain with deep breaths at that time.   ED Course: Got 3.5L NS, BP improved to 110s systolic, HR 70s, CP has resolved.   Assessment & Plan:   Principal Problem:   Hypotension Active Problems:   Essential hypertension   Diabetes (HCC)   Acute deep vein thrombosis (DVT) of proximal vein of left lower extremity (HCC)   AKI (acute kidney injury) (HCC)   Anemia  #1 hypotension Questionable etiology. Differential adrenal insufficiency versus hypovolemia. Patient had a VQ scan which was done with low probability for PE. 2-D echo with normal EF with no wall motion abnormalities and normal right ventricle systolic function. Blood pressure improved with IV fluids. Random cortisol level at 10:18 AM was 4.9 which is pretty low. Blood pressure improved with IV fluids. Patient was pancultured and blood cultures pending. Chest x-ray negative for any acute infiltrate. BNP at 17.7. Cardiac enzymes negative 3. Will check a cosyntropin stimulation test. Will place patient on dexamethasone 2 mg IV every 6+ fludrocortisone 50 g daily while awaiting cosyntropin stimulation test. Follow.  #2 recent acute DVT proximal left lower extremity Patient recently diagnosed with proximal vein left lower extremity DVT 08/06/2017 at [facility] and started on ElIQUIS which was subsequently switched to Coumadin due to insurance issues. Patient had a gap week in between  anticoagulations. Patient had no bridge for Coumadin. INRs have been therapeutic on Coumadin. Patient unable to get CT angiogram chest due to allergy to IV dye. VQ scan with low probability for PE.  2-D echo with EF of 55-60%. No wall motion abnormalities. Grade 1 diastolic dysfunction. INR currently at 2.73 from 4.51. Coumadin per pharmacy. Saline lock IV fluids. Follow.  #3 hypertension Blood pressure improved with hydration. Continue to hold antihypertensive medications.  #4 acute kidney injury/acute renal failure Likely secondary to prerenal azotemia. Renal function improved with hydration. Saline lock IV fluids.   #5 ?? diabetes mellitus Hemoglobin A1c was 5.7. Glucose levels have ranged from 89 -102. Patient currently on a diet. Metformin on hold. Continue sliding scale insulin.   #6 left-sided tremors Patient stated had some left-sided tremors early this morning that lasted 30 seconds to 1 minute. No urinary or bowel incontinence. No tongue biting. No syncopal episode. CBGs were not checked at that time and will check if patient has another episode. Check an EEG. Follow.  #7 anemia Likely dilutional anemia. Iron levels at 41, ferritin of 35. Folate at 84. Follow H&H.   DVT prophylaxis: Coumadin Code Status: Full Family Communication: Updated patient. No family at bedside. Disposition Plan: Remain in step down unit.   Consultants:   None  Procedures:   Chest x-ray 09/12/2017  2-D echo 09/13/2017  VQ scan 09/13/2017  Antimicrobials:   None   Subjective: Patient states feeling much better. Patient states early this morning had an episode of left-sided tremors which lasted about 30 seconds to about a minute and subsequently  resolved. Patient denies any bowel or urinary incontinence. No tongue biting. No syncopal episode.   Objective: Vitals:   09/14/17 0433 09/14/17 0600 09/14/17 0741 09/14/17 0800  BP: (!) 84/40 120/82 121/83 124/77  Pulse:  67 69 64  Resp:  Temp: 97.8 F (36.6 C)  98.1 F (36.7 C)   TempSrc: Oral  Oral   SpO2:  96% 99% 99%  Weight:      Height:        Intake/Output Summary (Last 24 hours) at 09/14/17 1113 Last data filed at 09/14/17 0700  Gross per 24 hour  Intake             3365 ml  Output             2620 ml  Net              745 ml   Filed Weights   09/12/17 1318 09/13/17 0600  Weight: 119.3 kg (263 lb) 116.2 kg (256 lb 2.8 oz)    Examination:  General exam: Appears calm and comfortable  Respiratory system: Clear to auscultation. No wheezes, no crackles, no rhonchi. Respiratory effort normal. Cardiovascular system: S1 & S2 heard, RRR. No JVD, murmurs, rubs, gallops or clicks. No pedal edema. Gastrointestinal system: Abdomen is Soft, nontender, nondistended, positive bowel sounds. No hepatosplenomegaly.  Central nervous system: Alert and oriented. No focal neurological deficits. Extremities: Left lower extremity bigger than right lower extremity. Skin: No rashes, lesions or ulcers Psychiatry: Judgement and insight appear normal. Mood & affect appropriate.     Data Reviewed: I have personally reviewed following labs and imaging studies  CBC:  Recent Labs Lab 09/12/17 1313 09/13/17 0155 09/14/17 0233  WBC 17.0* 13.5* 9.1  NEUTROABS 13.5*  --  5.5  HGB 11.1* 9.1* 8.6*  HCT 35.0* 28.5* 26.4*  MCV 88.4 88.2 89.2  PLT 275 208 207   Basic Metabolic Panel:  Recent Labs Lab 09/12/17 1313 09/13/17 0726 09/14/17 0233  NA 139 141 140  K 4.3 3.8 3.8  CL 108 114* 113*  CO2 19* 22 23  GLUCOSE 137* 89 102*  BUN 48* 28* 15  CREATININE 1.96* 1.07 1.02  CALCIUM 8.6* 7.6* 7.9*   GFR: Estimated Creatinine Clearance: 93.1 mL/min (by C-G formula based on SCr of 1.02 mg/dL). Liver Function Tests:  Recent Labs Lab 09/12/17 1313  AST 20  ALT 18  ALKPHOS 44  BILITOT 0.4  PROT 6.1*  ALBUMIN 3.5   No results for input(s): LIPASE, AMYLASE in the last 168 hours. No results for input(s): AMMONIA  in the last 168 hours. Coagulation Profile:  Recent Labs Lab 09/12/17 2200 09/13/17 0215 09/14/17 0233  INR 4.12* 4.51* 2.73   Cardiac Enzymes:  Recent Labs Lab 09/13/17 1032 09/13/17 2022  TROPONINI <0.03 <0.03   BNP (last 3 results) No results for input(s): PROBNP in the last 8760 hours. HbA1C:  Recent Labs  09/13/17 1018  HGBA1C 5.7*   CBG:  Recent Labs Lab 09/13/17 1630 09/13/17 2037 09/13/17 2339 09/14/17 0441 09/14/17 0736  GLUCAP 84 132* 110* 96 100*   Lipid Profile: No results for input(s): CHOL, HDL, LDLCALC, TRIG, CHOLHDL, LDLDIRECT in the last 72 hours. Thyroid Function Tests: No results for input(s): TSH, T4TOTAL, FREET4, T3FREE, THYROIDAB in the last 72 hours. Anemia Panel:  Recent Labs  09/13/17 1018 09/13/17 1032  VITAMINB12  --  475  FOLATE 8.4  --   FERRITIN  --  35  TIBC  --  259  IRON  --  41*   Sepsis Labs:  Recent Labs Lab 09/12/17 1334 09/12/17 1735  LATICACIDVEN 2.56* 1.23    Recent Results (from the past 240 hour(s))  Blood Culture (routine x 2)     Status: None (Preliminary result)   Collection Time: 09/12/17  1:32 PM  Result Value Ref Range Status   Specimen Description BLOOD RIGHT HAND  Final   Special Requests IN PEDIATRIC BOTTLE Blood Culture adequate volume  Final   Culture NO GROWTH < 24 HOURS  Final   Report Status PENDING  Incomplete  Blood Culture (routine x 2)     Status: None (Preliminary result)   Collection Time: 09/12/17  2:40 PM  Result Value Ref Range Status   Specimen Description BLOOD LEFT ANTECUBITAL  Final   Special Requests   Final    BOTTLES DRAWN AEROBIC AND ANAEROBIC Blood Culture adequate volume   Culture NO GROWTH < 24 HOURS  Final   Report Status PENDING  Incomplete  Urine culture     Status: None   Collection Time: 09/12/17  7:58 PM  Result Value Ref Range Status   Specimen Description URINE, RANDOM  Final   Special Requests NONE  Final   Culture NO GROWTH  Final   Report Status  09/14/2017 FINAL  Final  MRSA PCR Screening     Status: None   Collection Time: 09/13/17  6:15 AM  Result Value Ref Range Status   MRSA by PCR NEGATIVE NEGATIVE Final    Comment:        The GeneXpert MRSA Assay (FDA approved for NASAL specimens only), is one component of a comprehensive MRSA colonization surveillance program. It is not intended to diagnose MRSA infection nor to guide or monitor treatment for MRSA infections.          Radiology Studies: Nm Pulmonary Perf And Vent  Result Date: 09/13/2017 CLINICAL DATA:  Shortness of breath. History of deep venous thrombosis. EXAM: NUCLEAR MEDICINE VENTILATION - PERFUSION LUNG SCAN VIEWS: Anterior, posterior, left lateral, right lateral, RPO, LPO, LAO, RAO- ventilation and perfusion RADIOPHARMACEUTICALS:  31.5 mCi Technetium-64m DTPA aerosol inhalation and 4.2 mCi Technetium-28m MAA IV COMPARISON:  Chest radiograph September 12, 2017 FINDINGS: Ventilation: Radiotracer uptake on the ventilation study is homogeneous and symmetric bilaterally. No ventilation defects are appreciable. Perfusion: Radiotracer uptake on the perfusion study is homogeneous and symmetric bilaterally. No appreciable perfusion defects. No appreciable ventilation/ perfusion mismatch. IMPRESSION: The ventilation and perfusion studies appear unremarkable without focal ventilation or perfusion abnormalities. This study constitutes a very low probability of pulmonary embolus. Electronically Signed   By: Bretta Bang III M.D.   On: 09/13/2017 14:27   Dg Chest Port 1 View  Result Date: 09/12/2017 CLINICAL DATA:  Chest pain, hypotension EXAM: PORTABLE CHEST 1 VIEW COMPARISON:  12/20/2015 FINDINGS: The heart size and mediastinal contours are within normal limits. Both lungs are clear. The visualized skeletal structures are unremarkable. IMPRESSION: No active disease. Electronically Signed   By: Judie Petit.  Shick M.D.   On: 09/12/2017 13:42        Scheduled Meds: . aspirin  EC  325 mg Oral QPM  . atorvastatin  20 mg Oral Daily  . busPIRone  5 mg Oral BID  . [START ON 09/15/2017] cosyntropin  0.25 mg Intravenous Once  . gabapentin  300 mg Oral BID  . hydrOXYzine  25 mg Oral TID  . insulin aspart  0-9 Units Subcutaneous Q4H  . pantoprazole  40 mg Oral  Daily  . warfarin  2.5 mg Oral ONCE-1800  . Warfarin - Pharmacist Dosing Inpatient   Does not apply q1800   Continuous Infusions: . sodium chloride 125 mL/hr at 09/14/17 0522     LOS: 2 days    Time spent: 35 mins    Jaydalyn Demattia, MD [facility] Pager [phone]  If 7PM-7AM, please contact night-coverage [email or web address] Password [email or web address] 09/14/2017, 11:13 AM

## 2017-09-14 NOTE — Plan of Care (Signed)
Problem: Physical Regulation: Goal: Ability to maintain clinical measurements within normal limits will improve Outcome: Progressing Patient blood pressure softer overnight. Patient states he woke up trembling x 1, but it was non sustained.

## 2017-09-14 NOTE — Progress Notes (Signed)
EEG complete, results pending 

## 2017-09-14 NOTE — Plan of Care (Signed)
Problem: Education: Goal: Knowledge of [facility] Education information/materials will improve Outcome: Progressing Patient provided patient education guide

## 2017-09-14 NOTE — Progress Notes (Signed)
Nutrition Brief Note  Patient identified on the Malnutrition Screening Tool (MST) Report  Wt Readings from Last 15 Encounters:  09/13/17 256 lb 2.8 oz (116.2 kg)  02/23/17 291 lb 9.6 oz (132.3 kg)  08/23/16 287 lb 12.8 oz (130.5 kg)  02/21/16 259 lb 6.4 oz (117.7 kg)  12/20/15 264 lb 8.8 oz (120 kg)  12/10/15 266 lb (120.7 kg)  10/27/14 280 lb (127 kg)  04/18/14 275 lb (124.7 kg)    Body mass index is 35.23 kg/m. Patient meets criteria for obesity based on current BMI. Pt reports intentional weight loss related to anticipated knee surgery.   Current diet order is heart healthy/carbohydrate modified, patient is consuming approximately 100% of meals at this time. Pt reports good PO intake PTA as well. Reports cooking more at home now.   Nutrition focused physical exam completed. Findings include no fat or muscle depletion.   Labs and medications reviewed.   No nutrition interventions warranted at this time. If nutrition issues arise, please consult RD.   Fransisca Kaufmann, MS, RDN, LDN 09/14/2017 1:35 PM

## 2017-09-15 DIAGNOSIS — E2740 Unspecified adrenocortical insufficiency: Secondary | ICD-10-CM | POA: Diagnosis present

## 2017-09-15 DIAGNOSIS — R42 Dizziness and giddiness: Secondary | ICD-10-CM

## 2017-09-15 LAB — CBC
HCT: 28.1 % — ABNORMAL LOW (ref 39.0–52.0)
Hemoglobin: 9.2 g/dL — ABNORMAL LOW (ref 13.0–17.0)
MCH: 28.6 pg (ref 26.0–34.0)
MCHC: 32.7 g/dL (ref 30.0–36.0)
MCV: 87.3 fL (ref 78.0–100.0)
PLATELETS: 278 10*3/uL (ref 150–400)
RBC: 3.22 MIL/uL — AB (ref 4.22–5.81)
RDW: 14.7 % (ref 11.5–15.5)
WBC: 11.1 10*3/uL — AB (ref 4.0–10.5)

## 2017-09-15 LAB — GLUCOSE, CAPILLARY
GLUCOSE-CAPILLARY: 118 mg/dL — AB (ref 65–99)
GLUCOSE-CAPILLARY: 130 mg/dL — AB (ref 65–99)
GLUCOSE-CAPILLARY: 139 mg/dL — AB (ref 65–99)
GLUCOSE-CAPILLARY: 149 mg/dL — AB (ref 65–99)
GLUCOSE-CAPILLARY: 153 mg/dL — AB (ref 65–99)
GLUCOSE-CAPILLARY: 167 mg/dL — AB (ref 65–99)
GLUCOSE-CAPILLARY: 200 mg/dL — AB (ref 65–99)

## 2017-09-15 LAB — MAGNESIUM: Magnesium: 2 mg/dL (ref 1.7–2.4)

## 2017-09-15 LAB — BASIC METABOLIC PANEL
ANION GAP: 4 — AB (ref 5–15)
BUN: 10 mg/dL (ref 6–20)
CO2: 24 mmol/L (ref 22–32)
Calcium: 8.8 mg/dL — ABNORMAL LOW (ref 8.9–10.3)
Chloride: 111 mmol/L (ref 101–111)
Creatinine, Ser: 0.9 mg/dL (ref 0.61–1.24)
GFR calc Af Amer: >60 mL/min (ref >60)
Glucose, Bld: 138 mg/dL — ABNORMAL HIGH (ref 65–99)
POTASSIUM: 3.9 mmol/L (ref 3.5–5.1)
SODIUM: 139 mmol/L (ref 135–145)

## 2017-09-15 LAB — CORTISOL: Cortisol, Plasma: 1.1 ug/dL

## 2017-09-15 LAB — PROTIME-INR
INR: 1.49
Prothrombin Time: 17.8 seconds — ABNORMAL HIGH (ref 11.4–15.2)

## 2017-09-15 LAB — ACTH STIMULATION, 3 TIME POINTS
CORTISOL 30 MIN: 17.5 ug/dL
CORTISOL 60 MIN: 24.1 ug/dL
Cortisol, Base: 1 ug/dL

## 2017-09-15 MED ORDER — INSULIN ASPART 100 UNIT/ML ~~LOC~~ SOLN
0.0000 [IU] | Freq: Three times a day (TID) | SUBCUTANEOUS | Status: DC
Start: 2017-09-15 — End: 2017-09-16
  Administered 2017-09-15: 2 [IU] via SUBCUTANEOUS
  Administered 2017-09-15 – 2017-09-16 (×2): 3 [IU] via SUBCUTANEOUS

## 2017-09-15 MED ORDER — HYDROCORTISONE 10 MG PO TABS
10.0000 mg | ORAL_TABLET | Freq: Every evening | ORAL | Status: DC
  Administered 2017-09-15: 10 mg via ORAL
  Filled 2017-09-15 (×3): qty 1

## 2017-09-15 MED ORDER — WARFARIN SODIUM 5 MG PO TABS
5.0000 mg | ORAL_TABLET | Freq: Once | ORAL | Status: AC
  Administered 2017-09-15: 5 mg via ORAL
  Filled 2017-09-15: qty 1

## 2017-09-15 MED ORDER — ENOXAPARIN SODIUM 120 MG/0.8ML ~~LOC~~ SOLN
115.0000 mg | Freq: Two times a day (BID) | SUBCUTANEOUS | Status: DC
  Administered 2017-09-15 – 2017-09-16 (×3): 115 mg via SUBCUTANEOUS
  Filled 2017-09-15 (×5): qty 0.77

## 2017-09-15 MED ORDER — INSULIN ASPART 100 UNIT/ML ~~LOC~~ SOLN: 0.0000 [IU] | Freq: Every day | SUBCUTANEOUS | Status: DC

## 2017-09-15 MED ORDER — HYDROCORTISONE 20 MG PO TABS
20.0000 mg | ORAL_TABLET | Freq: Every day | ORAL | Status: DC
  Administered 2017-09-15 – 2017-09-16 (×2): 20 mg via ORAL
  Filled 2017-09-15 (×2): qty 1

## 2017-09-15 NOTE — Progress Notes (Signed)
Telephone report called and given to Adrianne, RN (receiving RN- 4E). All questions answered and addressed. Pt notified and aware of transfer; denies any needs. VSS. Transferred to 4E10 on telemetry in stable condition.

## 2017-09-15 NOTE — Progress Notes (Signed)
Patient admitted from 60M, A&Oc3, VSS. Telemetry applied and CCMD notified. Patient denies any needs at this time.

## 2017-09-15 NOTE — Procedures (Signed)
Date 09/14/17  Referring physician Ramiro Harvest  Reason for the study 66 year old male presented with dizziness and generalized weakness  Technical-This is a multichannel digital EEG recording using the international 10-20 placement system.  Description of the recording Posterior dominant rhythm is 9-10 Hz symmetrical reactive and well sustained. Photic stimulation and hyperventilation did not produce any abnormal responses. Non-REM stage II sleep was  not obtained No localizing, lateralizing, interictal epileptiform features were seen during this recording.   Impression  The EEG is normal with patient recorded in awake and drowsy states.

## 2017-09-15 NOTE — Progress Notes (Signed)
ANTICOAGULATION CONSULT NOTE - Follow Up Consult  Pharmacy Consult for Coumadin and Lovenox Indication: DVT  Patient Measurements: Height: 5' 11.5" (181.6 cm) Weight: 256 lb 2.8 oz (116.2 kg) IBW/kg (Calculated) : 76.45  Vital Signs: Temp: 98.2 F (36.8 C) (09/22 0847) Temp Source: Oral (09/22 0847) BP: 131/62 (09/22 0847) Pulse Rate: 68 (09/22 0847)  Labs:  Recent Labs  09/13/17 0155 09/13/17 0215 09/13/17 0726 09/13/17 1032 09/13/17 2022 09/14/17 0233 09/15/17 0618  HGB 9.1*  --   --   --   --  8.6* 9.2*  HCT 28.5*  --   --   --   --  26.4* 28.1*  PLT 208  --   --   --   --  207 278  LABPROT  --  42.5*  --   --   --  28.7* 17.8*  INR  --  4.51*  --   --   --  2.73 1.49  CREATININE  --   --  1.07  --   --  1.02 0.90  TROPONINI  --   --   --  <0.03 <0.03  --   --     Estimated Creatinine Clearance: 105.5 mL/min (by C-G formula based on SCr of 0.9 mg/dL).   Medications:  Coumadin, Lovenox   Assessment: 66 year old male who was started on anticoagulation in August for DVT. He was transitioned from Eliquis to Coumadin and per his report has been therapeutic on Coumadin prior to arrival. He was admitted 9/19 with shortness of breath. Warfarin initally held due to SUPRAtherapeutic INR but resumed on 9/21.   INR now 2.73 > 1.49 CBC stable Home dose reported as 5 mg daily  Goal of Therapy:  INR 2-3 Monitor platelets by anticoagulation protocol: Yes   Plan:  1. Lovenox 115 mg subcutaneously every 12 hours  2. Warfarin 5 mg x 1 this evening  3. Daily INR  Pollyann Samples, PharmD, BCPS 09/15/2017, 10:36 AM

## 2017-09-15 NOTE — Progress Notes (Signed)
PROGRESS NOTE    [name]  JOA:[id number] DOB: 08/16/1951 DOA: 09/12/2017 PCP: Dennard Schaumann, MD    Brief Narrative:  George Frank is a 66 y.o. male with medical history significant of DVT in proximal vein of LLE diagnosed on 8/13 at [facility].  Patient was started on Eliquis, which he took for 15 days before switching to coumadin (secondary to insurance reasons).  This morning patient had sudden onset of dizziness, diaphoresis, weakness.  EMS was called.  Initial BP 80/60 with HR of 110.  Given 750 cc NS.  Patient also had associated chest pain with deep breaths at that time.   ED Course: Got 3.5L NS, BP improved to 110s systolic, HR 70s, CP has resolved.   Assessment & Plan:   Principal Problem:   Adrenal insufficiency (HCC) Active Problems:   Hypotension   Essential hypertension   Diabetes (HCC)   Acute deep vein thrombosis (DVT) of proximal vein of left lower extremity (HCC)   AKI (acute kidney injury) (HCC)   Anemia  #1 adrenal insufficiency Patient had presented with sudden onset dizziness, diaphoresis, weakness initial blood pressure was 80/60 with a heart rate of 110. Patient blood pressure improved somewhat with IV fluids. Patient would recent DVT and a such concern was for PE which was negative by VQ scan. Random cortisol level obtained was 4.9. Patient underwent the cosyntropin stim test which was positive. Base line cortisol level was 1.0. Cortisol level 30 minutes post cosyntropin was 17.5 and 60 minutes post was 24.1. Check ACTH level. Prior to cosyntropin stem test patient was on dexamethasone and philtral cortisone. Will change from dexamethasone and fludrocortisone to oral hydrocortisone 20 mg every morning and 10 mg every afternoon. Will need to follow-up with endocrinology in the outpatient setting. May need a MRI of the head for further evaluation which may be done in outpatient setting.   #2 hypotension Secondary to adrenal insufficiency /#1.  Patient had a  VQ scan which was done with low probability for PE. 2-D echo with normal EF with no wall motion abnormalities and normal right ventricle systolic function. Blood pressure improved with IV fluids. Random cortisol level at 10:18 AM was 4.9 which is pretty low. Cosyntropin stim test was positive. Base cortisol level was 1.0. Cortisol level 30 minutes post cosyntropin was 17.5 and 60 minutes post was 24.1. Blood pressure improved with IV fluids. Patient was pancultured and blood cultures pending with no growth to date.. Chest x-ray negative for any acute infiltrate. BNP at 17.7. Cardiac enzymes negative 3. Will change dexamethasone 2 mg IV every 6+ fludrocortisone 50 g daily to hydrocortisone 20 mg by mouth every morning and 10 mg by mouth every afternoon. IV fluids have been saline locked.  #3 recent acute DVT proximal left lower extremity Patient recently diagnosed with proximal vein left lower extremity DVT 08/06/2017 at [facility] and started on ElIQUIS which was subsequently switched to Coumadin due to insurance issues. Patient had a gap week in between anticoagulations. Patient had no bridge for Coumadin. INRs have been therapeutic on Coumadin. Patient unable to get CT angiogram chest due to allergy to IV dye. VQ scan with low probability for PE.  2-D echo with EF of 55-60%. No wall motion abnormalities. Grade 1 diastolic dysfunction. INR currently at 1.49 from 2.73 from 4.51. Will bridge with Lovenox until INR is 2-3. Coumadin per pharmacy. Saline lock IV fluids. Follow.  #4 hypertension Blood pressure improved with hydration. Continue to hold antihypertensive medications. Patient on IV  dexamethasone and fludrocortisone. Monitor.  #5 acute kidney injury/acute renal failure Likely secondary to prerenal azotemia. Renal function improved with hydration. Saline lock IV fluids.   #6 ?? diabetes mellitus Hemoglobin A1c was 5.7. Glucose levels have ranged from 139 -153. Patient currently on a  diet. Metformin on hold. Continue sliding scale insulin.   #7 left-sided tremors Patient stated had some left-sided tremors the morning of 9/20 and 09/14/2017. No further tremors. No urinary or bowel incontinence. No tongue biting. No syncopal episode. CBGs were not checked at that time and will check if patient has another episode. EEG was normal. Patient with no further episodes. Follow.   #8 anemia Likely dilutional anemia. Iron levels at 41, ferritin of 35. Folate at 84. H&H seems to have stabilized at 9.2.    DVT prophylaxis: Coumadin Code Status: Full Family Communication: Updated patient. No family at bedside. Disposition Plan: Transfer to telemetry.   Consultants:   None  Procedures:   Chest x-ray 09/12/2017  2-D echo 09/13/2017  VQ scan 09/13/2017  EEG 09/14/2017  Antimicrobials:   None   Subjective: Patient denies any chest pain.no shortness of breath. Patient states feeling better. No further left-sided tremors noted. No syncopal episodes. No lightheadedness. No dizziness. states feeling much better.    Objective: Vitals:   09/15/17 0021 09/15/17 0053 09/15/17 0419 09/15/17 0847  BP: 132/78  (!) 138/92 131/62  Pulse: 67  69 68  Resp:    19  Temp: (!) 97.5 F (36.4 C) 99.7 F (37.6 C) 98.7 F (37.1 C) 98.2 F (36.8 C)  TempSrc: Oral Oral Oral Oral  SpO2: 98%  98% 98%  Weight:      Height:        Intake/Output Summary (Last 24 hours) at 09/15/17 0953 Last data filed at 09/15/17 1610  Gross per 24 hour  Intake          1438.33 ml  Output             2850 ml  Net         -1411.67 ml   Filed Weights   09/12/17 1318 09/13/17 0600  Weight: 119.3 kg (263 lb) 116.2 kg (256 lb 2.8 oz)    Examination:  General exam: Appears calm and comfortable  Respiratory system: Clear to auscultation bilaterally. No wheezes, no crackles, no rhonchi. Respiratory effort normal. Cardiovascular system: S1 & S2 heard, RRR. No JVD, murmurs, rubs, gallops or clicks.  No pedal edema. Gastrointestinal system: Abdomen is Soft, nontender, nondistended, positive bowel sounds. No hepatosplenomegaly.  Central nervous system: Alert and oriented. No focal neurological deficits. Extremities: Left lower extremity bigger than right lower extremity. Skin: No rashes, lesions or ulcers Psychiatry: Judgement and insight appear normal. Mood & affect appropriate.     Data Reviewed: I have personally reviewed following labs and imaging studies  CBC:  Recent Labs Lab 09/12/17 1313 09/13/17 0155 09/14/17 0233 09/15/17 0618  WBC 17.0* 13.5* 9.1 11.1*  NEUTROABS 13.5*  --  5.5  --   HGB 11.1* 9.1* 8.6* 9.2*  HCT 35.0* 28.5* 26.4* 28.1*  MCV 88.4 88.2 89.2 87.3  PLT 275 208 207 278   Basic Metabolic Panel:  Recent Labs Lab 09/12/17 1313 09/13/17 0726 09/14/17 0233 09/15/17 0618  NA 139 141 140 139  K 4.3 3.8 3.8 3.9  CL 108 114* 113* 111  CO2 19* GLUCOSE 137* 89 102* 138*  BUN 48* 28* 15 10  CREATININE 1.96* 1.07 1.02 0.90  CALCIUM 8.6* 7.6* 7.9* 8.8*  MG  --   --   --  2.0   GFR: Estimated Creatinine Clearance: 105.5 mL/min (by C-G formula based on SCr of 0.9 mg/dL). Liver Function Tests:  Recent Labs Lab 09/12/17 1313  AST 20  ALT 18  ALKPHOS 44  BILITOT 0.4  PROT 6.1*  ALBUMIN 3.5   No results for input(s): LIPASE, AMYLASE in the last 168 hours. No results for input(s): AMMONIA in the last 168 hours. Coagulation Profile:  Recent Labs Lab 09/12/17 2200 09/13/17 0215 09/14/17 0233 09/15/17 0618  INR 4.12* 4.51* 2.73 1.49   Cardiac Enzymes:  Recent Labs Lab 09/13/17 1032 09/13/17 2022  TROPONINI <0.03 <0.03   BNP (last 3 results) No results for input(s): PROBNP in the last 8760 hours. HbA1C:  Recent Labs  09/13/17 1018  HGBA1C 5.7*   CBG:  Recent Labs Lab 09/14/17 1607 09/14/17 2018 09/15/17 0017 09/15/17 0421 09/15/17 0851  GLUCAP 138* 171* 139* 153* 149*   Lipid Profile: No results for  input(s): CHOL, HDL, LDLCALC, TRIG, CHOLHDL, LDLDIRECT in the last 72 hours. Thyroid Function Tests: No results for input(s): TSH, T4TOTAL, FREET4, T3FREE, THYROIDAB in the last 72 hours. Anemia Panel:  Recent Labs  09/13/17 1018 09/13/17 1032  VITAMINB12  --  475  FOLATE 8.4  --   FERRITIN  --  35  TIBC  --  259  IRON  --  41*   Sepsis Labs:  Recent Labs Lab 09/12/17 1334 09/12/17 1735  LATICACIDVEN 2.56* 1.23    Recent Results (from the past 240 hour(s))  Blood Culture (routine x 2)     Status: None (Preliminary result)   Collection Time: 09/12/17  1:32 PM  Result Value Ref Range Status   Specimen Description BLOOD RIGHT HAND  Final   Special Requests IN PEDIATRIC BOTTLE Blood Culture adequate volume  Final   Culture NO GROWTH 2 DAYS  Final   Report Status PENDING  Incomplete  Blood Culture (routine x 2)     Status: None (Preliminary result)   Collection Time: 09/12/17  2:40 PM  Result Value Ref Range Status   Specimen Description BLOOD LEFT ANTECUBITAL  Final   Special Requests   Final    BOTTLES DRAWN AEROBIC AND ANAEROBIC Blood Culture adequate volume   Culture NO GROWTH 2 DAYS  Final   Report Status PENDING  Incomplete  Urine culture     Status: None   Collection Time: 09/12/17  7:58 PM  Result Value Ref Range Status   Specimen Description URINE, RANDOM  Final   Special Requests NONE  Final   Culture NO GROWTH  Final   Report Status 09/14/2017 FINAL  Final  MRSA PCR Screening     Status: None   Collection Time: 09/13/17  6:15 AM  Result Value Ref Range Status   MRSA by PCR NEGATIVE NEGATIVE Final    Comment:        The GeneXpert MRSA Assay (FDA approved for NASAL specimens only), is one component of a comprehensive MRSA colonization surveillance program. It is not intended to diagnose MRSA infection nor to guide or monitor treatment for MRSA infections.          Radiology Studies: Nm Pulmonary Perf And Vent  Result Date: 09/13/2017 CLINICAL  DATA:  Shortness of breath. History of deep venous thrombosis. EXAM: NUCLEAR MEDICINE VENTILATION - PERFUSION LUNG SCAN VIEWS: Anterior, posterior, left lateral, right lateral, RPO, LPO, LAO, RAO- ventilation and perfusion RADIOPHARMACEUTICALS:  31.5 mCi Technetium-56m DTPA aerosol inhalation and 4.2 mCi Technetium-29m MAA IV COMPARISON:  Chest radiograph September 12, 2017 FINDINGS: Ventilation: Radiotracer uptake on the ventilation study is homogeneous and symmetric bilaterally. No ventilation defects are appreciable. Perfusion: Radiotracer uptake on the perfusion study is homogeneous and symmetric bilaterally. No appreciable perfusion defects. No appreciable ventilation/ perfusion mismatch. IMPRESSION: The ventilation and perfusion studies appear unremarkable without focal ventilation or perfusion abnormalities. This study constitutes a very low probability of pulmonary embolus. Electronically Signed   By: Bretta Bang III M.D.   On: 09/13/2017 14:27        Scheduled Meds: . aspirin EC  325 mg Oral QPM  . atorvastatin  20 mg Oral Daily  . busPIRone  5 mg Oral BID  . dexamethasone  2 mg Intravenous Q6H  . fludrocortisone  0.05 mg Oral Daily  . gabapentin  300 mg Oral BID  . hydrOXYzine  25 mg Oral TID  . insulin aspart  0-9 Units Subcutaneous Q4H  . pantoprazole  40 mg Oral Daily  . Warfarin - Pharmacist Dosing Inpatient   Does not apply q1800   Continuous Infusions:    LOS: 3 days    Time spent: 40 mins    THOMPSON,DANIEL, MD [facility] Pager [phone]  If 7PM-7AM, please contact night-coverage [email or web address] Password [facility] 09/15/2017, 9:53 AM

## 2017-09-16 LAB — CBC
HCT: 29.7 % — ABNORMAL LOW (ref 39.0–52.0)
Hemoglobin: 9.4 g/dL — ABNORMAL LOW (ref 13.0–17.0)
MCH: 28.1 pg (ref 26.0–34.0)
MCHC: 31.6 g/dL (ref 30.0–36.0)
MCV: 88.7 fL (ref 78.0–100.0)
PLATELETS: 328 10*3/uL (ref 150–400)
RBC: 3.35 MIL/uL — AB (ref 4.22–5.81)
RDW: 15.1 % (ref 11.5–15.5)
WBC: 14.4 10*3/uL — ABNORMAL HIGH (ref 4.0–10.5)

## 2017-09-16 LAB — BASIC METABOLIC PANEL
Anion gap: 6 (ref 5–15)
BUN: 14 mg/dL (ref 6–20)
CALCIUM: 8.8 mg/dL — AB (ref 8.9–10.3)
CO2: 26 mmol/L (ref 22–32)
Chloride: 109 mmol/L (ref 101–111)
Creatinine, Ser: 1.03 mg/dL (ref 0.61–1.24)
GFR calc Af Amer: >60 mL/min (ref >60)
GFR calc non Af Amer: >60 mL/min (ref >60)
GLUCOSE: 112 mg/dL — AB (ref 65–99)
POTASSIUM: 3.5 mmol/L (ref 3.5–5.1)
Sodium: 141 mmol/L (ref 135–145)

## 2017-09-16 LAB — PROTIME-INR
INR: 1.42
Prothrombin Time: 17.2 seconds — ABNORMAL HIGH (ref 11.4–15.2)

## 2017-09-16 LAB — GLUCOSE, CAPILLARY: GLUCOSE-CAPILLARY: 99 mg/dL (ref 65–99)

## 2017-09-16 MED ORDER — ENOXAPARIN (LOVENOX) PATIENT EDUCATION KIT
PACK | Freq: Once | Status: DC
  Filled 2017-09-16: qty 1

## 2017-09-16 MED ORDER — ENOXAPARIN SODIUM 120 MG/0.8ML ~~LOC~~ SOLN
115.0000 mg | Freq: Two times a day (BID) | SUBCUTANEOUS | 0 refills | Status: DC
Start: 2017-09-16 — End: 2019-10-24

## 2017-09-16 MED ORDER — WARFARIN SODIUM 5 MG PO TABS: 5.0000 mg | ORAL_TABLET | Freq: Once | ORAL | Status: DC

## 2017-09-16 MED ORDER — HYDROCORTISONE 10 MG PO TABS: 10.0000 mg | ORAL_TABLET | Freq: Every evening | ORAL | 1 refills | Status: DC

## 2017-09-16 MED ORDER — POTASSIUM CHLORIDE CRYS ER 20 MEQ PO TBCR
40.0000 meq | EXTENDED_RELEASE_TABLET | Freq: Once | ORAL | Status: AC
  Administered 2017-09-16: 40 meq via ORAL
  Filled 2017-09-16: qty 2

## 2017-09-16 NOTE — Progress Notes (Signed)
ANTICOAGULATION CONSULT NOTE - Follow Up Consult  Pharmacy Consult for Coumadin and Lovenox Indication: DVT  Patient Measurements: Height: 5' 11.5" (181.6 cm) Weight: 256 lb 2.8 oz (116.2 kg) IBW/kg (Calculated) : 76.45   Assessment: 66 year old male who was started on anticoagulation for DVT diagnosed on 8/13 at [facility], started on Eliquis x 15 days, then changed to warfarin d/t cost. On Coumadin  PO daily PTA. INRs have been therapeutic as outpatient per patient. 9/19 Admit with possible PE. Started on heparin but held d/t INR >4. VQ low prob. PE. Warfarin resumed 09/21. INR now 1.42. Hgb low but stable at 9.4, plts wnl.  Goal of Therapy:  INR 2-3 Monitor platelets by anticoagulation protocol: Yes   Plan:  Continue Lovenox  SQ Q12h until INR therapeutic  Give Coumadin  PO x 1 Monitor daily INR, CBC, s/s of bleed  Enzo Bi, PharmD, [facility] Clinical Pharmacist Pager [phone] 09/16/2017 9:47 AM

## 2017-09-16 NOTE — Care Management Note (Addendum)
Case Management Note Donn Pierini RN, BSN Unit 4E-Case Manager [phone]  Patient Details  Name: George Frank MRN: [id number] Date of Birth: 07-14-1951  Subjective/Objective:   Pt admitted with adrenal insufficiency and recent DVT                 Action/Plan: PTA pt lived at home- independent- referral for Lovenox needs- insurance coverage checks can not be done on the weekend will submit first thing Monday morning- if pt is discharging today can send script to pharmacy to check pricing. CM to follow   Update- spoke with pt at bedside- per pt he does not have any prescription drug coverage- uses Goodrx-coupons for most drugs- has already researched where best price is and has coupons downloaded to phone- went over correct dosage with pt for Lovenox- and that brought cost down a bit for pt with coupon- hydrocortisone also has coupon that pt can use- pt reports that both prices with coupon he can afford around $75 total.  Pt will use pharmacy with best prices with GoodRx coupons.  Have notified Dr. Janee Morn of above.   Expected Discharge Date:    09/16/17              Expected Discharge Plan:  Home w Home Health Services  In-House Referral:     Discharge planning Services  CM Consult, Medication Assistance  Post Acute Care Choice:    Choice offered to:     DME Arranged:    DME Agency:     HH Arranged:    HH Agency:     Status of Service:  In process, will continue to follow  If discussed at Long Length of Stay Meetings, dates discussed:    Discharge Disposition: home/self care   Additional Comments:  Darrold Span, RN 09/16/2017, 10:16 AM

## 2017-09-16 NOTE — Progress Notes (Signed)
[facility] to be D/C'd Home per MD order. Discussed with the patient and all questions fully answered.    VVS, Skin clean, dry and intact without evidence of skin break down, no evidence of skin tears noted.  IV catheter discontinued intact. Site without signs and symptoms of complications. Dressing and pressure applied.  An After Visit Summary was printed and given to the patient.  Patient escorted via WC, and D/C home via private auto.  Kai Levins  09/16/2017 4:59 PM

## 2017-09-16 NOTE — Discharge Summary (Signed)
Physician Discharge Summary  George Frank WUJ:[id number] DOB: January 28, 1951 DOA: 09/12/2017  PCP: George Schaumann, MD  Admit date: 09/12/2017 Discharge date: 09/16/2017  Time spent: 65 minutes  Recommendations for Outpatient Follow-up:  1. Follow-up with George Frank, MDIn1-2 weeks. On follow-up patient will need a basic metabolic profile done to follow-up on electrolytes and renal function. ACTH levels as needed to be followed up upon which was pending at discharge. Patient will referral to endocrinology I outpatient setting for further evaluation and management of adrenal insufficiency 2. Follow-up at Coumadin clinic on Tuesday, 09/18/2017 for PT/INR check and further recommendations on Lovenox bridge with Coumadin.   Discharge Diagnoses:  Principal Problem:   Adrenal insufficiency (HCC) Active Problems:   Hypotension   Essential hypertension   Diabetes (HCC)   Acute deep vein thrombosis (DVT) of proximal vein of left lower extremity (HCC)   AKI (acute kidney injury) (HCC)   Anemia   Discharge Condition: stable and improved  Diet recommendation: heart healthy  Filed Weights   09/12/17 1318 09/13/17 0600  Weight: 119.3 kg (263 lb) 116.2 kg (256 lb 2.8 oz)    History of present illness:  Per Dr [name] is a 66 y.o. male with medical history significant of DVT in proximal vein of LLE diagnosed on 8/13 at [facility].  Patient was started on Eliquis, which he took for 15 days before switching to coumadin (secondary to insurance reasons). This morning patient had sudden onset of dizziness, diaphoresis, weakness.  EMS was called.  Initial BP 80/60 with HR of 110.  Given 750 cc NS.  Patient also had associated chest pain with deep breaths at that time.   ED Course: Got 3.5L NS, BP improved to 110s systolic, HR 70s, CP has resolved.   Hospital Course:  #1 adrenal insufficiency Patient had presented with sudden onset dizziness, diaphoresis, weakness initial blood  pressure was 80/60 with a heart rate of 110. Patient blood pressure improved somewhat with IV fluids. Patient with recent DVT and as such concern was for PE which was negative by VQ scan. Random cortisol level obtained was 4.9. Patient underwent the cosyntropin stim test which was positive. Base line cortisol level was 1.0. Cortisol level 30 minutes post cosyntropin was 17.5 and 60 minutes post was 24.1. ACTH level was ordered and pending at time of discharge. Prior to cosyntropin stim test patient was on dexamethasone and fludrocortisone. After cosyntropin stimulation test was done she was transitioned from dexamethasone and fludrocortisone to oral hydrocortisone 20 mg every morning and 10 mg every afternoon which she tolerated. Will need to follow-up with endocrinology in the outpatient setting. May need a MRI of the head for further evaluation which may be done in outpatient setting.   #2 hypotension Secondary to adrenal insufficiency /See #1.  Patient had a VQ scan which was done with low probability for PE. 2-D echo with normal EF with no wall motion abnormalities and normal right ventricle systolic function. Blood pressure improved with IV fluids. Random cortisol level at 10:18 AM was 4.9 which was pretty low. Cosyntropin stim test was positive. Base cortisol level was 1.0. Cortisol level 30 minutes post cosyntropin was 17.5 and 60 minutes post was 24.1. Blood pressure improved with IV fluids. Patient was pancultured and blood cultures pending with no growth to date.. Chest x-ray negative for any acute infiltrate. BNP at 17.7. Cardiac enzymes negative 3. Patient's blood pressure improved on IV fluids as well as dexamethasone 2 mg IV every 6+ fludrocortisone 50 g daily.  After cosyntropin stimulation test was completed patient was subsequently transitioned to hydrocortisone 20 mg by mouth every morning and 10 mg by mouth every afternoon. IV fluids were saline locked and blood pressure remained  stable.  #3 recent acute DVT proximal left lower extremity Patient recently diagnosed with proximal vein left lower extremity DVT 08/06/2017 at [facility] and started on [facility] which was subsequently switched to Coumadin due to insurance issues. Patient had a gap week in between anticoagulations. Patient had no bridge for Coumadin. INRs have been therapeutic on Coumadin. Patient unable to get CT angiogram chest due to allergy to IV dye. VQ scan with low probability for PE.  2-D echo with EF of 55-60%. No wall motion abnormalities. Grade 1 diastolic dysfunction. INR was initially supratherapeutic and Coumadin held. Coumadin was subsequently resumed however INR became subtherapeutic and patient was placed on a Lovenox bridge. INR on day of discharge was 1.42. Patient did state that had someone who could administer subcutaneous Lovenox while on Coumadin. Patient will follow-up at his Coumadin clinic on Tuesday, 09/18/2017 for PT/INR check and further recommendations. Patient be discharged on 3 days worth of Lovenox and back on his home dose Coumadin.  #4 hypertension Blood pressure improved with hydration. Oral antihypertensive medications of lisinopril was held. Patient's blood pressure improved on fluids and initially IV dexamethasone and fludrocortisone. Patient subsequently transitioned to oral hydrocortisone.  #5 acute kidney injury/acute renal failure Likely secondary to prerenal azotemia. Renal function improved with hydration and acute renal failure/acute kidney injury had resolved by day of discharge. Patient's ACE inhibitor was discontinued during the hospitalization.  #6 ?? diabetes mellitus Hemoglobin A1c was 5.7. Patient's oral hypoglycemic agent of metformin was held during the hospitalization and patient maintained on sliding scale insulin. Outpatient follow-up.  #7 left-sided tremors Patient stated had some left-sided tremors the morning of 9/20 and 09/14/2017. No further  tremors. No urinary or bowel incontinence. No tongue biting. No syncopal episode. CBGs were not checked at that time. EEG was normal. Patient with no further episodes. Outpatient follow-up.  #8 anemia Likely dilutional anemia. Iron levels at 41, ferritin of 35. Folate at 84. Hemoglobin stabilized at 9.4.    Procedures:  Chest x-ray 09/12/2017  2-D echo 09/13/2017  VQ scan 09/13/2017  EEG 09/14/2017  Consultations:  None  Discharge Exam: Vitals:   09/15/17 2014 09/15/17 2015  BP: 131/85   Pulse:    Resp: 19 18  Temp: 97.7 F (36.5 C)   SpO2:      General: NAD Cardiovascular: RRR Respiratory: CTAB  Discharge Instructions   Discharge Instructions    Diet - low sodium heart healthy    Complete by:  As directed    Increase activity slowly    Complete by:  As directed      Current Discharge Medication List    START taking these medications   Details  enoxaparin (LOVENOX) 120 MG/0.8ML injection Inject 0.77 mLs (115 mg total) into the skin every 12 (twelve) hours. Qty: 6 Syringe, Refills: 0    hydrocortisone (CORTEF) 10 MG tablet Take 1-2 tablets (10-20 mg total) by mouth every evening. Take 2 tablets ( ) in the AM and 1 tablet ( ) in PM Qty: 90 tablet, Refills: 1      CONTINUE these medications which have NOT CHANGED   Details  aspirin EC 325 MG tablet Take 1 tablet (325 mg total) by mouth daily. Qty: 30 tablet, Refills: 0    atorvastatin (LIPITOR) 20 MG tablet Take 1 tablet (  20 mg total) by mouth daily at 6 PM. Qty: 30 tablet, Refills: 0    busPIRone (BUSPAR) 5 MG tablet Take 5 mg by mouth 2 (two) times daily.    gabapentin (NEURONTIN) 300 MG capsule Take 300 mg by mouth 2 (two) times daily.     hydrOXYzine (ATARAX/VISTARIL) 25 MG tablet Take 25 mg by mouth 3 (three) times daily.    metFORMIN (GLUCOPHAGE) 1000 MG tablet Take 1,000 mg by mouth 2 (two) times daily.    pantoprazole (PROTONIX) 40 MG tablet Take 1 tablet (40 mg total) by mouth  daily. Qty: 30 tablet, Refills: 0    warfarin (COUMADIN) 5 MG tablet Take 5 mg by mouth daily at 6 PM.      STOP taking these medications     lisinopril (PRINIVIL,ZESTRIL) 20 MG tablet        Allergies  Allergen Reactions  . Ivp Dye [Iodinated Diagnostic Agents] Anaphylaxis and Other (See Comments)    RESPIRATORY ARREST AND Pt reports serious reaction in 1972 at [facility] (PATIENT WAS TOLD THAT IF IT WAS ADMINISTERED TO HIM AGAIN HE "WILL DIE")  . Metrizamide Anaphylaxis and Other (See Comments)    RESPIRATORY ARREST AND Pt reports serious reaction in 1972 at [facility] (PATIENT WAS TOLD THAT IF IT WAS ADMINISTERED TO HIM AGAIN HE "WILL DIE")  . Zoloft [Sertraline Hcl] Diarrhea and Other (See Comments)    Also hallucinations, made patient feel trapped, and increased anxiety   Follow-up Information    George Schaumann, MD. Schedule an appointment as soon as possible for a visit in 1 week(s).   Specialty:  Internal Medicine Why:  F/U IN 1-2 WEEKS.       PCP Follow up on 09/18/2017.   Why:  PT/INR CHECK ON TUESDAY 09/18/2017 AT PCP OFFICE            The results of significant diagnostics from this hospitalization (including imaging, microbiology, ancillary and laboratory) are listed below for reference.    Significant Diagnostic Studies: Nm Pulmonary Perf And Vent  Result Date: 09/13/2017 CLINICAL DATA:  Shortness of breath. History of deep venous thrombosis. EXAM: NUCLEAR MEDICINE VENTILATION - PERFUSION LUNG SCAN VIEWS: Anterior, posterior, left lateral, right lateral, RPO, LPO, LAO, RAO- ventilation and perfusion RADIOPHARMACEUTICALS:  31.5 mCi Technetium-48m DTPA aerosol inhalation and 4.2 mCi Technetium-64m MAA IV COMPARISON:  Chest radiograph September 12, 2017 FINDINGS: Ventilation: Radiotracer uptake on the ventilation study is homogeneous and symmetric bilaterally. No ventilation defects are appreciable. Perfusion: Radiotracer uptake on the perfusion  study is homogeneous and symmetric bilaterally. No appreciable perfusion defects. No appreciable ventilation/ perfusion mismatch. IMPRESSION: The ventilation and perfusion studies appear unremarkable without focal ventilation or perfusion abnormalities. This study constitutes a very low probability of pulmonary embolus. Electronically Signed   By: Bretta Bang III M.D.   On: 09/13/2017 14:27   Dg Chest Port 1 View  Result Date: 09/12/2017 CLINICAL DATA:  Chest pain, hypotension EXAM: PORTABLE CHEST 1 VIEW COMPARISON:  12/20/2015 FINDINGS: The heart size and mediastinal contours are within normal limits. Both lungs are clear. The visualized skeletal structures are unremarkable. IMPRESSION: No active disease. Electronically Signed   By: Judie Petit.  Shick M.D.   On: 09/12/2017 13:42    Microbiology: Recent Results (from the past 240 hour(s))  Blood Culture (routine x 2)     Status: None (Preliminary result)   Collection Time: 09/12/17  1:32 PM  Result Value Ref Range Status   Specimen Description BLOOD RIGHT HAND  Final  Special Requests IN PEDIATRIC BOTTLE Blood Culture adequate volume  Final   Culture NO GROWTH 3 DAYS  Final   Report Status PENDING  Incomplete  Blood Culture (routine x 2)     Status: None (Preliminary result)   Collection Time: 09/12/17  2:40 PM  Result Value Ref Range Status   Specimen Description BLOOD LEFT ANTECUBITAL  Final   Special Requests   Final    BOTTLES DRAWN AEROBIC AND ANAEROBIC Blood Culture adequate volume   Culture NO GROWTH 3 DAYS  Final   Report Status PENDING  Incomplete  Urine culture     Status: None   Collection Time: 09/12/17  7:58 PM  Result Value Ref Range Status   Specimen Description URINE, RANDOM  Final   Special Requests NONE  Final   Culture NO GROWTH  Final   Report Status 09/14/2017 FINAL  Final  MRSA PCR Screening     Status: None   Collection Time: 09/13/17  6:15 AM  Result Value Ref Range Status   MRSA by PCR NEGATIVE NEGATIVE Final     Comment:        The GeneXpert MRSA Assay (FDA approved for NASAL specimens only), is one component of a comprehensive MRSA colonization surveillance program. It is not intended to diagnose MRSA infection nor to guide or monitor treatment for MRSA infections.      Labs: Basic Metabolic Panel:  Recent Labs Lab 09/12/17 1313 09/13/17 0726 09/14/17 0233 09/15/17 0618 09/16/17 0443  NA 139 141 140 139 141  K 4.3 3.8 3.8 3.9 3.5  CL 108 114* 113* 111 109  CO2 19* GLUCOSE 137* 89 102* 138* 112*  BUN 48* 28* CREATININE 1.96* 1.07 1.02 0.90 1.03  CALCIUM 8.6* 7.6* 7.9* 8.8* 8.8*  MG  --   --   --  2.0  --    Liver Function Tests:  Recent Labs Lab 09/12/17 1313  AST 20  ALT 18  ALKPHOS 44  BILITOT 0.4  PROT 6.1*  ALBUMIN 3.5   No results for input(s): LIPASE, AMYLASE in the last 168 hours. No results for input(s): AMMONIA in the last 168 hours. CBC:  Recent Labs Lab 09/12/17 1313 09/13/17 0155 09/14/17 0233 09/15/17 0618 09/16/17 0443  WBC 17.0* 13.5* 9.1 11.1* 14.4*  NEUTROABS 13.5*  --  5.5  --   --   HGB 11.1* 9.1* 8.6* 9.2* 9.4*  HCT 35.0* 28.5* 26.4* 28.1* 29.7*  MCV 88.4 88.2 89.2 87.3 88.7  PLT 275 208 207 278 328   Cardiac Enzymes:  Recent Labs Lab 09/13/17 1032 09/13/17 2022  TROPONINI <0.03 <0.03   BNP: BNP (last 3 results)  Recent Labs  09/12/17 2200  BNP 17.7    ProBNP (last 3 results) No results for input(s): PROBNP in the last 8760 hours.  CBG:  Recent Labs Lab 09/15/17 1151 09/15/17 1546 09/15/17 1746 09/15/17 2137 09/16/17 0538  GLUCAP 167* 200* 130* 118* 99       Signed:  Rosaelena Kemnitz MD.  [facility] 09/16/2017, 3:07 PM

## 2017-09-17 LAB — CULTURE, BLOOD (ROUTINE X 2)
CULTURE: NO GROWTH
Culture: NO GROWTH
SPECIAL REQUESTS: ADEQUATE
SPECIAL REQUESTS: ADEQUATE

## 2017-09-18 LAB — ACTH: C206 ACTH: 2 pg/mL — ABNORMAL LOW (ref 7.2–63.3)

## 2019-10-24 ENCOUNTER — Inpatient Hospital Stay (HOSPITAL_COMMUNITY): Payer: Medicare Other

## 2019-10-24 ENCOUNTER — Emergency Department (HOSPITAL_COMMUNITY): Payer: Medicare Other

## 2019-10-24 ENCOUNTER — Inpatient Hospital Stay (HOSPITAL_COMMUNITY)
Admission: EM | Admit: 2019-10-24 | Discharge: 2019-10-26 | DRG: 871 | Disposition: A | Discharge status: Home / Self Care | Payer: Medicare Other | Attending: Student | Admitting: Student

## 2019-10-24 DIAGNOSIS — E785 Hyperlipidemia, unspecified: Secondary | ICD-10-CM | POA: Diagnosis present

## 2019-10-24 DIAGNOSIS — N179 Acute kidney failure, unspecified: Secondary | ICD-10-CM | POA: Diagnosis present

## 2019-10-24 DIAGNOSIS — Z87891 Personal history of nicotine dependence: Secondary | ICD-10-CM

## 2019-10-24 DIAGNOSIS — I10 Essential (primary) hypertension: Secondary | ICD-10-CM | POA: Diagnosis not present

## 2019-10-24 DIAGNOSIS — Z87442 Personal history of urinary calculi: Secondary | ICD-10-CM

## 2019-10-24 DIAGNOSIS — I5033 Acute on chronic diastolic (congestive) heart failure: Secondary | ICD-10-CM | POA: Diagnosis present

## 2019-10-24 DIAGNOSIS — I803 Phlebitis and thrombophlebitis of lower extremities, unspecified: Secondary | ICD-10-CM | POA: Diagnosis present

## 2019-10-24 DIAGNOSIS — Z91041 Radiographic dye allergy status: Secondary | ICD-10-CM

## 2019-10-24 DIAGNOSIS — Z8672 Personal history of thrombophlebitis: Secondary | ICD-10-CM

## 2019-10-24 DIAGNOSIS — Z20828 Contact with and (suspected) exposure to other viral communicable diseases: Secondary | ICD-10-CM | POA: Diagnosis present

## 2019-10-24 DIAGNOSIS — Z8673 Personal history of transient ischemic attack (TIA), and cerebral infarction without residual deficits: Secondary | ICD-10-CM | POA: Diagnosis not present

## 2019-10-24 DIAGNOSIS — G4733 Obstructive sleep apnea (adult) (pediatric): Secondary | ICD-10-CM | POA: Diagnosis present

## 2019-10-24 DIAGNOSIS — I639 Cerebral infarction, unspecified: Secondary | ICD-10-CM | POA: Diagnosis not present

## 2019-10-24 DIAGNOSIS — I825Y2 Chronic embolism and thrombosis of unspecified deep veins of left proximal lower extremity: Secondary | ICD-10-CM | POA: Diagnosis not present

## 2019-10-24 DIAGNOSIS — F419 Anxiety disorder, unspecified: Secondary | ICD-10-CM | POA: Diagnosis present

## 2019-10-24 DIAGNOSIS — J9601 Acute respiratory failure with hypoxia: Secondary | ICD-10-CM | POA: Diagnosis present

## 2019-10-24 DIAGNOSIS — R6520 Severe sepsis without septic shock: Secondary | ICD-10-CM | POA: Diagnosis present

## 2019-10-24 DIAGNOSIS — Z7982 Long term (current) use of aspirin: Secondary | ICD-10-CM | POA: Diagnosis not present

## 2019-10-24 DIAGNOSIS — E1149 Type 2 diabetes mellitus with other diabetic neurological complication: Secondary | ICD-10-CM

## 2019-10-24 DIAGNOSIS — R0602 Shortness of breath: Secondary | ICD-10-CM | POA: Diagnosis present

## 2019-10-24 DIAGNOSIS — Z9189 Other specified personal risk factors, not elsewhere classified: Secondary | ICD-10-CM | POA: Diagnosis not present

## 2019-10-24 DIAGNOSIS — M7989 Other specified soft tissue disorders: Secondary | ICD-10-CM | POA: Diagnosis not present

## 2019-10-24 DIAGNOSIS — I82409 Acute embolism and thrombosis of unspecified deep veins of unspecified lower extremity: Secondary | ICD-10-CM | POA: Diagnosis present

## 2019-10-24 DIAGNOSIS — Z86718 Personal history of other venous thrombosis and embolism: Secondary | ICD-10-CM

## 2019-10-24 DIAGNOSIS — Z823 Family history of stroke: Secondary | ICD-10-CM

## 2019-10-24 DIAGNOSIS — R0603 Acute respiratory distress: Secondary | ICD-10-CM | POA: Diagnosis not present

## 2019-10-24 DIAGNOSIS — A419 Sepsis, unspecified organism: Secondary | ICD-10-CM | POA: Diagnosis present

## 2019-10-24 DIAGNOSIS — E119 Type 2 diabetes mellitus without complications: Secondary | ICD-10-CM | POA: Diagnosis present

## 2019-10-24 DIAGNOSIS — R0600 Dyspnea, unspecified: Secondary | ICD-10-CM

## 2019-10-24 DIAGNOSIS — Z7902 Long term (current) use of antithrombotics/antiplatelets: Secondary | ICD-10-CM

## 2019-10-24 DIAGNOSIS — D72829 Elevated white blood cell count, unspecified: Secondary | ICD-10-CM | POA: Diagnosis present

## 2019-10-24 DIAGNOSIS — I110 Hypertensive heart disease with heart failure: Secondary | ICD-10-CM | POA: Diagnosis present

## 2019-10-24 DIAGNOSIS — J189 Pneumonia, unspecified organism: Secondary | ICD-10-CM | POA: Diagnosis present

## 2019-10-24 DIAGNOSIS — R509 Fever, unspecified: Secondary | ICD-10-CM | POA: Diagnosis not present

## 2019-10-24 DIAGNOSIS — I809 Phlebitis and thrombophlebitis of unspecified site: Secondary | ICD-10-CM | POA: Diagnosis present

## 2019-10-24 DIAGNOSIS — Z7984 Long term (current) use of oral hypoglycemic drugs: Secondary | ICD-10-CM

## 2019-10-24 DIAGNOSIS — M170 Bilateral primary osteoarthritis of knee: Secondary | ICD-10-CM | POA: Diagnosis present

## 2019-10-24 DIAGNOSIS — Z79899 Other long term (current) drug therapy: Secondary | ICD-10-CM | POA: Diagnosis not present

## 2019-10-24 DIAGNOSIS — Z888 Allergy status to other drugs, medicaments and biological substances status: Secondary | ICD-10-CM | POA: Diagnosis not present

## 2019-10-24 DIAGNOSIS — Z6835 Body mass index (BMI) 35.0-35.9, adult: Secondary | ICD-10-CM | POA: Diagnosis not present

## 2019-10-24 DIAGNOSIS — K219 Gastro-esophageal reflux disease without esophagitis: Secondary | ICD-10-CM | POA: Diagnosis present

## 2019-10-24 DIAGNOSIS — R6510 Systemic inflammatory response syndrome (SIRS) of non-infectious origin without acute organ dysfunction: Secondary | ICD-10-CM | POA: Diagnosis not present

## 2019-10-24 DIAGNOSIS — I5032 Chronic diastolic (congestive) heart failure: Secondary | ICD-10-CM | POA: Diagnosis not present

## 2019-10-24 LAB — URINALYSIS, ROUTINE W REFLEX MICROSCOPIC
Bacteria, UA: NONE SEEN
Bilirubin Urine: NEGATIVE
Glucose, UA: NEGATIVE mg/dL
Ketones, ur: NEGATIVE mg/dL
Leukocytes,Ua: NEGATIVE
Nitrite: NEGATIVE
Protein, ur: NEGATIVE mg/dL
Specific Gravity, Urine: 1.024 (ref 1.005–1.030)
pH: 5 (ref 5.0–8.0)

## 2019-10-24 LAB — COMPREHENSIVE METABOLIC PANEL
ALT: 14 U/L (ref 0–44)
AST: 20 U/L (ref 15–41)
Albumin: 4 g/dL (ref 3.5–5.0)
Alkaline Phosphatase: 55 U/L (ref 38–126)
Anion gap: 12 (ref 5–15)
BUN: 18 mg/dL (ref 8–23)
CO2: 24 mmol/L (ref 22–32)
Calcium: 9.7 mg/dL (ref 8.9–10.3)
Chloride: 101 mmol/L (ref 98–111)
Creatinine, Ser: 1.36 mg/dL — ABNORMAL HIGH (ref 0.61–1.24)
GFR calc Af Amer: >60 mL/min (ref >60)
GFR calc non Af Amer: 53 mL/min — ABNORMAL LOW (ref >60)
Glucose, Bld: 108 mg/dL — ABNORMAL HIGH (ref 70–99)
Potassium: 4.8 mmol/L (ref 3.5–5.1)
Sodium: 137 mmol/L (ref 135–145)
Total Bilirubin: 0.9 mg/dL (ref 0.3–1.2)
Total Protein: 7.1 g/dL (ref 6.5–8.1)

## 2019-10-24 LAB — LACTIC ACID, PLASMA
Lactic Acid, Venous: 2.4 mmol/L (ref 0.5–1.9)
Lactic Acid, Venous: 2 mmol/L (ref 0.5–1.9)
Lactic Acid, Venous: 1.6 mmol/L (ref 0.5–1.9)

## 2019-10-24 LAB — HEMOGLOBIN A1C
Hgb A1c MFr Bld: 5.6 % (ref 4.8–5.6)
Mean Plasma Glucose: 114.02 mg/dL

## 2019-10-24 LAB — CBC
HCT: 47 % (ref 39.0–52.0)
HCT: 41.8 % (ref 39.0–52.0)
Hemoglobin: 15 g/dL (ref 13.0–17.0)
Hemoglobin: 13 g/dL (ref 13.0–17.0)
MCH: 29 pg (ref 26.0–34.0)
MCH: 29.1 pg (ref 26.0–34.0)
MCHC: 31.9 g/dL (ref 30.0–36.0)
MCHC: 31.1 g/dL (ref 30.0–36.0)
MCV: 90.9 fL (ref 80.0–100.0)
MCV: 93.5 fL (ref 80.0–100.0)
Platelets: 256 10*3/uL (ref 150–400)
Platelets: 209 10*3/uL (ref 150–400)
RBC: 5.17 MIL/uL (ref 4.22–5.81)
RBC: 4.47 MIL/uL (ref 4.22–5.81)
RDW: 13.7 % (ref 11.5–15.5)
RDW: 13.8 % (ref 11.5–15.5)
WBC: 30.7 10*3/uL — ABNORMAL HIGH (ref 4.0–10.5)
WBC: 29 10*3/uL — ABNORMAL HIGH (ref 4.0–10.5)
nRBC: 0 % (ref 0.0–0.2)
nRBC: 0 % (ref 0.0–0.2)

## 2019-10-24 LAB — RAPID URINE DRUG SCREEN, HOSP PERFORMED
Amphetamines: NOT DETECTED
Barbiturates: NOT DETECTED
Benzodiazepines: NOT DETECTED
Cocaine: NOT DETECTED
Opiates: POSITIVE — AB
Tetrahydrocannabinol: NOT DETECTED

## 2019-10-24 LAB — TROPONIN I (HIGH SENSITIVITY): Troponin I (High Sensitivity): 12 ng/L (ref <18)

## 2019-10-24 LAB — CREATININE, SERUM
Creatinine, Ser: 1.32 mg/dL — ABNORMAL HIGH (ref 0.61–1.24)
GFR calc Af Amer: >60 mL/min (ref >60)
GFR calc non Af Amer: 55 mL/min — ABNORMAL LOW (ref >60)

## 2019-10-24 LAB — GLUCOSE, CAPILLARY
Glucose-Capillary: 105 mg/dL — ABNORMAL HIGH (ref 70–99)
Glucose-Capillary: 127 mg/dL — ABNORMAL HIGH (ref 70–99)

## 2019-10-24 LAB — MAGNESIUM: Magnesium: 1.4 mg/dL — ABNORMAL LOW (ref 1.7–2.4)

## 2019-10-24 LAB — HIV ANTIBODY (ROUTINE TESTING W REFLEX): HIV Screen 4th Generation wRfx: NONREACTIVE

## 2019-10-24 LAB — LIPASE, BLOOD: Lipase: 17 U/L (ref 11–51)

## 2019-10-24 LAB — D-DIMER, QUANTITATIVE: D-Dimer, Quant: 1.65 ug/mL-FEU — ABNORMAL HIGH (ref 0.00–0.50)

## 2019-10-24 LAB — SARS CORONAVIRUS 2 BY RT PCR (HOSPITAL ORDER, PERFORMED IN ~~LOC~~ HOSPITAL LAB): SARS Coronavirus 2: NEGATIVE

## 2019-10-24 LAB — TSH: TSH: 1.099 u[IU]/mL (ref 0.350–4.500)

## 2019-10-24 MED ORDER — CLOPIDOGREL BISULFATE 75 MG PO TABS
75.0000 mg | ORAL_TABLET | Freq: Every day | ORAL | Status: DC
  Administered 2019-10-24 – 2019-10-26 (×3): 75 mg via ORAL
  Filled 2019-10-24 (×3): qty 1

## 2019-10-24 MED ORDER — INSULIN ASPART 100 UNIT/ML ~~LOC~~ SOLN
0.0000 [IU] | Freq: Three times a day (TID) | SUBCUTANEOUS | Status: DC
  Administered 2019-10-25: 2 [IU] via SUBCUTANEOUS

## 2019-10-24 MED ORDER — VANCOMYCIN HCL 10 G IV SOLR
1500.0000 mg | INTRAVENOUS | Status: DC
  Filled 2019-10-24: qty 1500

## 2019-10-24 MED ORDER — VANCOMYCIN HCL IN DEXTROSE 1-5 GM/200ML-% IV SOLN: 1000.0000 mg | Freq: Once | INTRAVENOUS | Status: DC

## 2019-10-24 MED ORDER — TECHNETIUM TO 99M ALBUMIN AGGREGATED
1.4700 | Freq: Once | INTRAVENOUS | Status: AC | PRN
  Administered 2019-10-24: 1.47 via INTRAVENOUS

## 2019-10-24 MED ORDER — ONDANSETRON HCL 4 MG/2ML IJ SOLN: 4.0000 mg | Freq: Four times a day (QID) | INTRAMUSCULAR | Status: DC | PRN

## 2019-10-24 MED ORDER — SODIUM CHLORIDE 0.9 % IV SOLN
2.0000 g | Freq: Once | INTRAVENOUS | Status: AC
  Administered 2019-10-24: 2 g via INTRAVENOUS
  Filled 2019-10-24: qty 2

## 2019-10-24 MED ORDER — ONDANSETRON HCL 4 MG PO TABS: 4.0000 mg | ORAL_TABLET | Freq: Four times a day (QID) | ORAL | Status: DC | PRN

## 2019-10-24 MED ORDER — GABAPENTIN 300 MG PO CAPS
300.0000 mg | ORAL_CAPSULE | Freq: Three times a day (TID) | ORAL | Status: DC
  Administered 2019-10-24 – 2019-10-26 (×6): 300 mg via ORAL
  Filled 2019-10-24: qty 3
  Filled 2019-10-24 (×5): qty 1

## 2019-10-24 MED ORDER — ACETAMINOPHEN 325 MG PO TABS
650.0000 mg | ORAL_TABLET | Freq: Four times a day (QID) | ORAL | Status: DC | PRN
  Administered 2019-10-24 – 2019-10-25 (×2): 650 mg via ORAL
  Filled 2019-10-24 (×2): qty 2

## 2019-10-24 MED ORDER — PANTOPRAZOLE SODIUM 40 MG PO TBEC
40.0000 mg | DELAYED_RELEASE_TABLET | Freq: Every day | ORAL | Status: DC
  Administered 2019-10-24 – 2019-10-26 (×3): 40 mg via ORAL
  Filled 2019-10-24 (×3): qty 1

## 2019-10-24 MED ORDER — SODIUM CHLORIDE 0.9 % IV BOLUS
1000.0000 mL | Freq: Once | INTRAVENOUS | Status: AC
  Administered 2019-10-24: 12:00:00 1000 mL via INTRAVENOUS

## 2019-10-24 MED ORDER — SODIUM CHLORIDE 0.9% FLUSH: 3.0000 mL | Freq: Once | INTRAVENOUS | Status: DC

## 2019-10-24 MED ORDER — SODIUM CHLORIDE 0.9 % IV SOLN
INTRAVENOUS | Status: DC
  Administered 2019-10-24 – 2019-10-26 (×5): via INTRAVENOUS

## 2019-10-24 MED ORDER — INSULIN ASPART 100 UNIT/ML ~~LOC~~ SOLN: 0.0000 [IU] | Freq: Every day | SUBCUTANEOUS | Status: DC

## 2019-10-24 MED ORDER — CARVEDILOL 12.5 MG PO TABS
12.5000 mg | ORAL_TABLET | Freq: Two times a day (BID) | ORAL | Status: DC
  Administered 2019-10-24 – 2019-10-26 (×4): 12.5 mg via ORAL
  Filled 2019-10-24 (×4): qty 1

## 2019-10-24 MED ORDER — SODIUM CHLORIDE 0.9 % IV SOLN
2.0000 g | Freq: Two times a day (BID) | INTRAVENOUS | Status: DC
  Administered 2019-10-25: 03:00:00 2 g via INTRAVENOUS
  Filled 2019-10-24 (×2): qty 2

## 2019-10-24 MED ORDER — ENOXAPARIN SODIUM 40 MG/0.4ML ~~LOC~~ SOLN
40.0000 mg | SUBCUTANEOUS | Status: DC
  Administered 2019-10-24 – 2019-10-25 (×2): 40 mg via SUBCUTANEOUS
  Filled 2019-10-24 (×2): qty 0.4

## 2019-10-24 MED ORDER — ACETAMINOPHEN 500 MG PO TABS
1000.0000 mg | ORAL_TABLET | Freq: Once | ORAL | Status: AC
Start: 2019-10-24 — End: 2019-10-24
  Administered 2019-10-24: 12:00:00 1000 mg via ORAL
  Filled 2019-10-24: qty 2

## 2019-10-24 MED ORDER — METRONIDAZOLE IN NACL 5-0.79 MG/ML-% IV SOLN
500.0000 mg | Freq: Once | INTRAVENOUS | Status: AC
  Administered 2019-10-24: 500 mg via INTRAVENOUS
  Filled 2019-10-24: qty 100

## 2019-10-24 MED ORDER — OXYCODONE HCL 5 MG PO TABS
10.0000 mg | ORAL_TABLET | Freq: Once | ORAL | Status: AC
  Administered 2019-10-24: 10 mg via ORAL
  Filled 2019-10-24: qty 2

## 2019-10-24 MED ORDER — ACETAMINOPHEN 650 MG RE SUPP: 650.0000 mg | Freq: Four times a day (QID) | RECTAL | Status: DC | PRN

## 2019-10-24 MED ORDER — VANCOMYCIN HCL 10 G IV SOLR
2500.0000 mg | Freq: Once | INTRAVENOUS | Status: AC
  Administered 2019-10-24: 2500 mg via INTRAVENOUS
  Filled 2019-10-24: qty 2500
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
## 2019-10-24 NOTE — ED Notes (Signed)
Pt transported to [facility] 

## 2019-10-24 NOTE — Progress Notes (Signed)
Lower extremity venous has been completed.   Preliminary results in CV Proc.   Abram Sander 10/24/2019 3:52 PM

## 2019-10-24 NOTE — H&P (Addendum)
History and Physical    George Frank ZOX:[id number] DOB: 03/26/1951 DOA: 10/24/2019  PCP: Dennard Schaumann, MD  Patient coming from: Home  I have personally briefly reviewed patient's old medical records in [facility]  Chief Complaint: Sudden onset of shortness of breath  HPI: George Frank is a 68 y.o. male with medical history significant of prediabetes, GERD, ischemic stroke with no residual weakness, obstructive sleep apnea on CPAP, left leg DVT-not on anticoagulation, chronic left leg thrombophlebitis, grade 1 diastolic congestive heart failure with preserved ejection fraction of 55 to 60%, presents to emergency department due to acute onset of shortness of breath started this morning while he was sleeping.  Patient called 911 as he was not able to catch his breath.  Upon arrival of EMS he was found to have hypoxic with O2 sats of 88%.  He placed on 2 L of oxygen via nasal cannula.  Has chronic thrombophlebitis of left leg-takes clindamycin as needed.  Denies recent travel, immobilization, recent steroid use.  Patient denies association with chest pain, palpitation, leg swelling, orthopnea, PND, headache, blurry vision, cough, congestion, wheezing, COVID-19 exposure, nausea, vomiting, diarrhea, urinary symptoms, decreased appetite, generalized weakness or lethargy.  No neck pain, confusion, change in behavior, rash.  He lives alone and denies smoking, alcohol, illicit drug use.  ED Course: Upon arrival: Patient was tachycardic, tachypneic, fever of 102.8, COVID-19 negative, UA negative, chest x-ray negative, lactic acid: 2.4.  CBC shows leukocytosis of 30.7, D-dimer slightly elevated at 1.65.  CMP shows mild AKI.  Patient was given broad-spectrum antibiotics in ED.   Review of Systems: As per HPI otherwise negative.    Past Medical History:  Diagnosis Date  . Anxiety   . Arthritis    "knees" (12/20/2015)  . Aspiration pneumonia (HCC) 12/10/2015  . Depression   . Diabetes  mellitus without complication (HCC)   . DVT (deep venous thrombosis) (HCC) 1990s   LLE  . Enlarged heart   . GERD (gastroesophageal reflux disease)   . HCAP (healthcare-associated pneumonia) 12/20/2015  . Hyperlipidemia   . Hypertension   . Kidney stones    "passed them"  . On home oxygen therapy    "2L 24/7 last week; stopped 12/17/2015; dr said I could use it prn; had to use it again 12/19/2015"  . Phlebitis   . Sciatica   . Sciatica of right side   . Sleep apnea   . Stroke (HCC) 12/10/2015   denies residual on 12/20/2015    Past Surgical History:  Procedure Laterality Date  . FEMUR FRACTURE SURGERY Left 1972  . FRACTURE SURGERY    . KNEE CARTILAGE SURGERY Right    scope  . NASAL SEPTUM SURGERY  ?2000s  . TONSILLECTOMY       reports that he quit smoking about 27 years ago. His smoking use included cigarettes. He has a 69.00 pack-year smoking history. He has never used smokeless tobacco. He reports that he does not drink alcohol or use drugs.  Allergies  Allergen Reactions  . Ivp Dye [Iodinated Diagnostic Agents] Anaphylaxis and Other (See Comments)    RESPIRATORY ARREST AND Pt reports serious reaction in 1972 at [facility] (PATIENT WAS TOLD THAT IF IT WAS ADMINISTERED TO HIM AGAIN HE "WILL DIE")  . Metrizamide Anaphylaxis and Other (See Comments)    RESPIRATORY ARREST AND Pt reports serious reaction in 1972 at [facility] (PATIENT WAS TOLD THAT IF IT WAS ADMINISTERED TO HIM AGAIN HE "WILL DIE")  . Sertraline Hcl Diarrhea  and Other (See Comments)    Also hallucinations, made patient feel trapped, and increased anxiety    Family History  Problem Relation Age of Onset  . Stroke Father   . Diabetes Mellitus II Neg Hx     Prior to Admission medications   Medication Sig Start Date End Date Taking? Authorizing Provider  aspirin EC 325 MG tablet Take 1 tablet (325 mg total) by mouth daily. Patient taking differently: Take 325 mg by mouth daily as needed for  mild pain.  12/12/15  Yes Hongalgi, Lenis Dickinson, MD  carvedilol (COREG) 12.5 MG tablet Take 12.5 mg by mouth 2 (two) times daily with a meal.   Yes [provider]  clindamycin (CLEOCIN) 300 MG capsule Take 300 mg by mouth 4 (four) times daily.   Yes [provider]  clopidogrel (PLAVIX) 75 MG tablet Take 75 mg by mouth daily.   Yes [provider]  furosemide (LASIX) 80 MG tablet Take 80 mg by mouth daily.   Yes [provider]  gabapentin (NEURONTIN) 300 MG capsule Take 300 mg by mouth 3 (three) times daily.    Yes [provider]  metFORMIN (GLUCOPHAGE) 1000 MG tablet Take 1,000 mg by mouth 2 (two) times daily.   Yes [provider]  omeprazole (PRILOSEC) 20 MG capsule Take 20 mg by mouth daily.   Yes [provider]  Oxycodone HCl 10 MG TABS Take 10 mg by mouth 3 (three) times daily as needed (pain).   Yes [provider]    Physical Exam: Vitals:   10/24/19 1500 10/24/19 1515 10/24/19 1530 10/24/19 1539  BP: 107/65 121/72 131/71 131/71  Pulse: 73 65 72 75  Resp: 14 16 18 17   Temp:    98.7 F (37.1 C)  TempSrc:    Oral  SpO2: 92% 95% 95% 96%  Weight:      Height:         Vitals:   10/24/19 1500 10/24/19 1515 10/24/19 1530 10/24/19 1539  BP: 107/65 121/72 131/71 131/71  Pulse: 73 65 72 75  Resp: 14 16 18 17   Temp:    98.7 F (37.1 C)  TempSrc:    Oral  SpO2: 92% 95% 95% 96%  Weight:      Height:       Constitutional: Alert and oriented x4, sitting comfortably, communicating well, Eyes: PERRL, lids and conjunctivae normal ENMT: Mucous membranes are moist. Posterior pharynx clear of any exudate or lesions.Normal dentition.  Neck: normal, supple, no masses, no thyromegaly Respiratory: clear to auscultation bilaterally, no wheezing, no crackles. Normal respiratory effort. No accessory muscle use.  Cardiovascular: Regular rate and rhythm, no murmurs / rubs / gallops. . 2+ pedal pulses. No carotid bruits.   Abdomen: no tenderness, no masses palpated. No hepatosplenomegaly. Bowel sounds positive.  Musculoskeletal: no clubbing / cyanosis. No joint deformity upper and lower extremities. Good ROM, no contractures. Normal muscle tone.  Skin: Left leg: Swollen as compared to right.  Nonerythematous, prominent veins noted on left leg. Neurologic: CN 2-12 grossly intact. Sensation intact, DTR normal. Strength 5/5 in all 4.  Psychiatric: Normal judgment and insight. Alert and oriented x 3. Normal mood.    Labs on Admission: I have personally reviewed following labs and imaging studies  CBC: Recent Labs  Lab 10/24/19 1218 10/24/19 1530  WBC 30.7* 29.0*  HGB 15.0 13.0  HCT 47.0 41.8  MCV 90.9 93.5  PLT 256 614   Basic Metabolic Panel: Recent Labs  Lab  10/24/19 1102 10/24/19 1530  NA 137  --   K 4.8  --   CL 101  --   CO2 24  --   GLUCOSE 108*  --   BUN 18  --   CREATININE 1.36* 1.32*  CALCIUM 9.7  --   MG  --  1.4*   GFR: Estimated Creatinine Clearance: 69.3 mL/min (A) (by C-G formula based on SCr of 1.32 mg/dL (H)). Liver Function Tests: Recent Labs  Lab 10/24/19 1102  AST 20  ALT 14  ALKPHOS 55  BILITOT 0.9  PROT 7.1  ALBUMIN 4.0   Recent Labs  Lab 10/24/19 1121  LIPASE 17   No results for input(s): AMMONIA in the last 168 hours. Coagulation Profile: No results for input(s): INR, PROTIME in the last 168 hours. Cardiac Enzymes: No results for input(s): CKTOTAL, CKMB, CKMBINDEX, TROPONINI in the last 168 hours. BNP (last 3 results) No results for input(s): PROBNP in the last 8760 hours. HbA1C: No results for input(s): HGBA1C in the last 72 hours. CBG: No results for input(s): GLUCAP in the last 168 hours. Lipid Profile: No results for input(s): CHOL, HDL, LDLCALC, TRIG, CHOLHDL, LDLDIRECT in the last 72 hours. Thyroid Function Tests: Recent Labs    10/24/19 1500  TSH 1.099   Anemia Panel: No results for input(s): VITAMINB12, FOLATE, FERRITIN, TIBC, IRON,  RETICCTPCT in the last 72 hours. Urine analysis:    Component Value Date/Time   COLORURINE YELLOW 10/24/2019 1313   APPEARANCEUR CLEAR 10/24/2019 1313   LABSPEC 1.024 10/24/2019 1313   PHURINE 5.0 10/24/2019 1313   GLUCOSEU NEGATIVE 10/24/2019 1313   HGBUR SMALL (A) 10/24/2019 1313   BILIRUBINUR NEGATIVE 10/24/2019 1313   KETONESUR NEGATIVE 10/24/2019 1313   PROTEINUR NEGATIVE 10/24/2019 1313   NITRITE NEGATIVE 10/24/2019 1313   LEUKOCYTESUR NEGATIVE 10/24/2019 1313    Radiological Exams on Admission: Dg Chest Portable 1 View  Result Date: 10/24/2019 CLINICAL DATA:  Short of breath EXAM: PORTABLE CHEST 1 VIEW COMPARISON:  November 22, 2018 FINDINGS: The heart size and mediastinal contours are within normal limits for portable technique. Both lungs are clear. The visualized skeletal structures are unremarkable. IMPRESSION: No active disease. Electronically Signed   By: Guadlupe SpanishPraneil  Patel M.D.   On: 10/24/2019 11:21   Vas [place] Lower Extremity Venous (dvt)  Result Date: 10/24/2019  Lower Venous Study Indications: Swelling.  Comparison Study: no prior Performing Technologist: Blanch MediaMegan Riddle RVS  Examination Guidelines: A complete evaluation includes B-mode imaging, spectral Doppler, color Doppler, and power Doppler as needed of all accessible portions of each vessel. Bilateral testing is considered an integral part of a complete examination. Limited examinations for reoccurring indications may be performed as noted.  +-----+---------------+---------+-----------+----------+--------------+ RIGHTCompressibilityPhasicitySpontaneityPropertiesThrombus Aging +-----+---------------+---------+-----------+----------+--------------+ CFV  Full           Yes      Yes                                 +-----+---------------+---------+-----------+----------+--------------+   +---------+---------------+---------+-----------+----------+--------------+ LEFT      CompressibilityPhasicitySpontaneityPropertiesThrombus Aging +---------+---------------+---------+-----------+----------+--------------+ CFV      Full           Yes      Yes                                 +---------+---------------+---------+-----------+----------+--------------+ SFJ      Full                                                        +---------+---------------+---------+-----------+----------+--------------+  FV Prox  Full                                                        +---------+---------------+---------+-----------+----------+--------------+ FV Mid   Full                                                        +---------+---------------+---------+-----------+----------+--------------+ FV DistalFull                                                        +---------+---------------+---------+-----------+----------+--------------+ PFV      Full                                                        +---------+---------------+---------+-----------+----------+--------------+ POP      Full           Yes      Yes                                 +---------+---------------+---------+-----------+----------+--------------+ PTV      Full                                                        +---------+---------------+---------+-----------+----------+--------------+ PERO     Full                                                        +---------+---------------+---------+-----------+----------+--------------+     Summary: Right: No evidence of common femoral vein obstruction. Left: There is no evidence of deep vein thrombosis in the lower extremity. No cystic structure found in the popliteal fossa.  *See table(s) above for measurements and observations.    Preliminary     EKG: Normal sinus rhythm, no ST elevation or depression noted.  Assessment/Plan Principal Problem:   Shortness of breath Active Problems:   Stroke with cerebral  ischemia (HCC)   Essential hypertension   Leucocytosis   Acute respiratory failure with hypoxia (HCC)   OSA (obstructive sleep apnea)   Diabetes (HCC)   AKI (acute kidney injury) (HCC)   GERD (gastroesophageal reflux disease)   Phlebitis   DVT (deep venous thrombosis) (HCC)   Fever of unknown origin   Fever of unknown origin:  -Patient presented with high-grade fever of 102.8, tachycardic, tachypneic, hypoxic -No neck pain, confusion, change in behavior. -Lactic acid elevated at 2.4, leukocytosis at 30.7, COVID-19 negative, chest x-ray negative, UA negative for infection. -Blood culture  is obtained and is pending.  -Patient received IV fluids in the ED and broad-spectrum antibiotics.  We will continue IV fluids and IV vancomycin and cefepime.  Acute hypoxic respiratory failure: -Unknown etiology. Patient had anaphylactic reaction to CT contrast-we will get VQ scan to rule out pulmonary embolism.  We will also get Doppler ultrasound of left lower extremity to rule out DVT.  Will check troponin. -Currently on 2 L of oxygen via nasal cannula-we will try to wean off his oxygen -On continuous pulse ox.  Chest x-ray is negative, COVID-19 is negative.  Prediabetes: Check A1c -Hold Metformin due to AKI and start on sliding scale insulin  GERD: Stable continue Protonix  Ischemic stroke: With no residual weakness -Continue aspirin, Plavix.  Hypertension: -We will continue Coreg  AKI: -Continue IV fluids and monitor BMP closely -Hold Lasix for now.  Grade 1 diastolic congestive heart failure: Not in acute exacerbation -Takes Lasix 80 mg once daily-hold for now due to AKI -He took Lasix this morning. -Strict INO's and daily weight and resume Lasix tomorrow a.m. -Reviewed echo from 08/2017  Obstructive sleep apnea: -We will order CPAP at bedtime.  DVT prophylaxis: TED/SCD Code Status: Full code Family Communication: None present at bedside.  Plan of care discussed with patient in  length and he verbalized understanding and agreed with it. Disposition Plan: TBD Consults called: None Admission status: Inpatient  Ollen Bowl MD [facility] Pager [phone]  If 7PM-7AM, please contact night-coverage [email or web address] Password [email or web address]  10/24/2019, 4:52 PM

## 2019-10-24 NOTE — ED Notes (Signed)
Date and time results received: 10/24/19 (use smartphrase ".now" to insert current time)  Test: Lactic  Critical Value: 2.4  Name of Provider Notified: [facility]

## 2019-10-24 NOTE — ED Notes (Signed)
X-ray at bedside

## 2019-10-25 ENCOUNTER — Inpatient Hospital Stay (HOSPITAL_COMMUNITY): Payer: Medicare Other

## 2019-10-25 DIAGNOSIS — K219 Gastro-esophageal reflux disease without esophagitis: Secondary | ICD-10-CM

## 2019-10-25 DIAGNOSIS — E1169 Type 2 diabetes mellitus with other specified complication: Secondary | ICD-10-CM

## 2019-10-25 DIAGNOSIS — R6510 Systemic inflammatory response syndrome (SIRS) of non-infectious origin without acute organ dysfunction: Secondary | ICD-10-CM

## 2019-10-25 DIAGNOSIS — E119 Type 2 diabetes mellitus without complications: Secondary | ICD-10-CM

## 2019-10-25 DIAGNOSIS — R509 Fever, unspecified: Secondary | ICD-10-CM

## 2019-10-25 DIAGNOSIS — I639 Cerebral infarction, unspecified: Secondary | ICD-10-CM

## 2019-10-25 DIAGNOSIS — N179 Acute kidney failure, unspecified: Secondary | ICD-10-CM

## 2019-10-25 DIAGNOSIS — R0603 Acute respiratory distress: Secondary | ICD-10-CM

## 2019-10-25 DIAGNOSIS — I825Y2 Chronic embolism and thrombosis of unspecified deep veins of left proximal lower extremity: Secondary | ICD-10-CM

## 2019-10-25 DIAGNOSIS — E1159 Type 2 diabetes mellitus with other circulatory complications: Secondary | ICD-10-CM

## 2019-10-25 DIAGNOSIS — I10 Essential (primary) hypertension: Secondary | ICD-10-CM

## 2019-10-25 DIAGNOSIS — E669 Obesity, unspecified: Secondary | ICD-10-CM

## 2019-10-25 DIAGNOSIS — I5032 Chronic diastolic (congestive) heart failure: Secondary | ICD-10-CM

## 2019-10-25 DIAGNOSIS — Z9189 Other specified personal risk factors, not elsewhere classified: Secondary | ICD-10-CM

## 2019-10-25 DIAGNOSIS — I809 Phlebitis and thrombophlebitis of unspecified site: Secondary | ICD-10-CM

## 2019-10-25 DIAGNOSIS — D72829 Elevated white blood cell count, unspecified: Secondary | ICD-10-CM

## 2019-10-25 LAB — BASIC METABOLIC PANEL
Anion gap: 10 (ref 5–15)
BUN: 18 mg/dL (ref 8–23)
CO2: 26 mmol/L (ref 22–32)
Calcium: 8.7 mg/dL — ABNORMAL LOW (ref 8.9–10.3)
Chloride: 101 mmol/L (ref 98–111)
Creatinine, Ser: 1.19 mg/dL (ref 0.61–1.24)
GFR calc Af Amer: >60 mL/min (ref >60)
GFR calc non Af Amer: >60 mL/min (ref >60)
Glucose, Bld: 107 mg/dL — ABNORMAL HIGH (ref 70–99)
Potassium: 4.1 mmol/L (ref 3.5–5.1)
Sodium: 137 mmol/L (ref 135–145)

## 2019-10-25 LAB — URINE CULTURE: Culture: <10000 — AB

## 2019-10-25 LAB — DIFFERENTIAL
Abs Immature Granulocytes: 0.19 10*3/uL — ABNORMAL HIGH (ref 0.00–0.07)
Basophils Absolute: 0.1 10*3/uL (ref 0.0–0.1)
Basophils Relative: 0 %
Eosinophils Absolute: 0.3 10*3/uL (ref 0.0–0.5)
Eosinophils Relative: 1 %
Immature Granulocytes: 1 %
Lymphocytes Relative: 6 %
Lymphs Abs: 1.5 10*3/uL (ref 0.7–4.0)
Monocytes Absolute: 1.2 10*3/uL — ABNORMAL HIGH (ref 0.1–1.0)
Monocytes Relative: 5 %
Neutro Abs: 22 10*3/uL — ABNORMAL HIGH (ref 1.7–7.7)
Neutrophils Relative %: 87 %

## 2019-10-25 LAB — MAGNESIUM: Magnesium: 1.4 mg/dL — ABNORMAL LOW (ref 1.7–2.4)

## 2019-10-25 LAB — CBC
HCT: 36.9 % — ABNORMAL LOW (ref 39.0–52.0)
HCT: 37.6 % — ABNORMAL LOW (ref 39.0–52.0)
Hemoglobin: 11.3 g/dL — ABNORMAL LOW (ref 13.0–17.0)
Hemoglobin: 11.5 g/dL — ABNORMAL LOW (ref 13.0–17.0)
MCH: 28.5 pg (ref 26.0–34.0)
MCH: 28.7 pg (ref 26.0–34.0)
MCHC: 30.6 g/dL (ref 30.0–36.0)
MCHC: 30.6 g/dL (ref 30.0–36.0)
MCV: 92.9 fL (ref 80.0–100.0)
MCV: 93.8 fL (ref 80.0–100.0)
Platelets: 203 10*3/uL (ref 150–400)
Platelets: 199 10*3/uL (ref 150–400)
RBC: 3.97 MIL/uL — ABNORMAL LOW (ref 4.22–5.81)
RBC: 4.01 MIL/uL — ABNORMAL LOW (ref 4.22–5.81)
RDW: 14 % (ref 11.5–15.5)
RDW: 13.9 % (ref 11.5–15.5)
WBC: 26.6 10*3/uL — ABNORMAL HIGH (ref 4.0–10.5)
WBC: 25.2 10*3/uL — ABNORMAL HIGH (ref 4.0–10.5)
nRBC: 0 % (ref 0.0–0.2)
nRBC: 0 % (ref 0.0–0.2)

## 2019-10-25 LAB — PROCALCITONIN: Procalcitonin: 0.37 ng/mL

## 2019-10-25 LAB — GLUCOSE, CAPILLARY
Glucose-Capillary: 104 mg/dL — ABNORMAL HIGH (ref 70–99)
Glucose-Capillary: 145 mg/dL — ABNORMAL HIGH (ref 70–99)
Glucose-Capillary: 110 mg/dL — ABNORMAL HIGH (ref 70–99)
Glucose-Capillary: 113 mg/dL — ABNORMAL HIGH (ref 70–99)

## 2019-10-25 LAB — BRAIN NATRIURETIC PEPTIDE: B Natriuretic Peptide: 158.2 pg/mL — ABNORMAL HIGH (ref 0.0–100.0)

## 2019-10-25 MED ORDER — FUROSEMIDE 40 MG PO TABS
40.0000 mg | ORAL_TABLET | Freq: Every day | ORAL | Status: DC
  Administered 2019-10-25 – 2019-10-26 (×2): 40 mg via ORAL
  Filled 2019-10-25 (×2): qty 1

## 2019-10-25 MED ORDER — AZITHROMYCIN 250 MG PO TABS
250.0000 mg | ORAL_TABLET | Freq: Every day | ORAL | Status: DC
  Administered 2019-10-26: 250 mg via ORAL
  Filled 2019-10-25: qty 1

## 2019-10-25 MED ORDER — MAGNESIUM SULFATE 2 GM/50ML IV SOLN
2.0000 g | Freq: Once | INTRAVENOUS | Status: AC
  Administered 2019-10-25: 2 g via INTRAVENOUS
  Filled 2019-10-25: qty 50

## 2019-10-25 MED ORDER — SODIUM CHLORIDE 0.9 % IV SOLN
2.0000 g | INTRAVENOUS | Status: DC
  Administered 2019-10-25: 2 g via INTRAVENOUS
  Filled 2019-10-25: qty 2
  Filled 2019-10-25: qty 20

## 2019-10-25 MED ORDER — OXYCODONE HCL 5 MG PO TABS
10.0000 mg | ORAL_TABLET | Freq: Three times a day (TID) | ORAL | Status: DC | PRN
  Administered 2019-10-25 – 2019-10-26 (×3): 10 mg via ORAL
  Filled 2019-10-25 (×3): qty 2

## 2019-10-25 MED ORDER — AZITHROMYCIN 500 MG PO TABS
500.0000 mg | ORAL_TABLET | Freq: Every day | ORAL | Status: AC
  Administered 2019-10-25: 500 mg via ORAL
  Filled 2019-10-25: qty 1

## 2019-10-25 NOTE — Progress Notes (Signed)
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  He says "I did not feel short of breath.  I just could not breath when I woke up for about 10 minutes.  He called EMS and brought to ED.  Admitted for SIRS with fever of unknown origin.  In ED, tachycardic, tachypneic and febrile to 102.8.  WBC 30.7.  Lactic acid 2.4.  COVID-19 and UA negative.  CXR read as negative but with diffuse perihilar opacities compared to his x-ray last month.  CMP with mild AKI.  D-dimer elevated 1.65.  EKG with abnormal RWP but no acute ischemic finding.  Cultures obtained.  Started on broad-spectrum antibiotics (Vanco, cefepime and Flagyl) and admitted for SIRS/FUO.  VQ scan and lower extremity Dopplers ordered to exclude PE and DVT respectively.  Patient has allergy to IV contrast.  Remained stable.  Leukocytosis improving.  Lactic acidosis resolved.  Blood cultures negative so far.  Urine culture pending.  CXR as above.  PE and DVT negative on VQ scan and lower extremity venous Dopplers.  The escalated antibiotics to ceftriaxone and azithromycin.   Subjective: No major events overnight of this morning.  No complaint this morning.  Denies headache, neck stiffness, runny nose, sore throat, cough, chest pain, shortness of breath, orthopnea, PND, nausea, vomiting, abdominal pain or UTI symptoms.  Objective: Vitals:   10/24/19 1724 10/24/19 2004 10/25/19 0427 10/25/19 0731  BP: 116/71 133/82 133/70 129/70  Pulse: 71 72 76 79  Resp: 18 18 20 20   Temp: 99.3 F (37.4 C) 98.3 F (36.8 C) 99.3 F (37.4 C) 98.9  F (37.2 C)  TempSrc: Oral Oral Oral Oral  SpO2: 97% 93% 92% 94%  Weight: 114.8 kg  116.3 kg   Height: 5\' 11"  (1.803 m)       Intake/Output Summary (Last 24 hours) at 10/25/2019 1120 Last data filed at 10/25/2019 1006 Gross per 24 hour  Intake 460 ml  Output 750 ml  Net -290 ml   Filed Weights   10/24/19 1049 10/24/19 1724 10/25/19 0427  Weight: 115.7 kg 114.8 kg 116.3 kg    Examination:  GENERAL: No acute distress.  Appears well.  HEENT: MMM.  Vision and hearing grossly intact.  NECK: Supple.  No apparent JVD.  RESP:  No IWOB. Good air movement bilaterally. CVS:  RRR. Heart sounds normal.  ABD/GI/GU: Bowel sounds present. Soft. Non tender.  MSK/EXT: Left lower extremity bigger than right (chronic).  Trace edema/vein insufficiency in LLE. SKIN: no apparent skin lesion or wound NEURO: Awake, alert and oriented appropriately.  No gross deficit.  PSYCH: Calm. Normal affect.  Assessment & Plan: SIRS/FUO: sepsis physiology resolved except for leukocytosis which has improved.  Unclear source of infection but suspect respiratory etiology.  He CXR although read as normal reveals more perihilar opacities compared to his CXR last months.  Blood cultures negative so far.  Urine culture with insignificant growth. -Will obtain 2 view chest x-ray today -Check procalcitonin -De-escalate antibiotics to ceftriaxone and azithromycin -Trend leukocytosis  Dyspnea: unclear etiology at this time.  He says he did not feel short of breath just could  not breathe for about 10 minutes.  Has OSA but not on CPAP.  He was recently switched from Norco to oxycodone which could exacerbate his OSA.  VQ scan and DVT Doppler negative.  Appears euvolemic.  No history of COPD.  However, patient has sepsis physiology without clear source. -Treat possible pneumonia as above -As needed DuoNeb.  Wean oxygen to room air -Check BNP -Has outpatient sleep study scheduled   Leukocytosis: Likely reactive due to  infectious process in the setting of fever. -Check differential -Trend  Chronic diastolic CHF: Echo in 08/2017 with EF of 55 to 60%, G1 DD.  Appears euvolemic.  On Lasix at home. -Resume home Lasix at 40 mg daily. -Monitor fluid status -Check BNP  AKI: Resolving. -Continue monitoring  History of ischemic CVA without residual deficits -Continue home Plavix and started  Essential hypertension -Continue home Coreg  History of LLE DVT/thrombophlebitis-off anticoagulation.  Lower extremity venous Doppler negative for DVT.  Controlled DM-2: A1c 5.6%.  On Metformin at home. CBG (last 3)  Recent Labs    10/24/19 2116 10/25/19 0634 10/25/19 1139  GLUCAP 127* 104* 145*   -SSI -Continue gabapentin and statin.  Elevated D-dimer: VQ scan and lower extremity Dopplers negative  Chronic bilateral knee arthritis -Resume home oxycodone at 5 mg -Voltaren gel  Morbid obesity: BMI 35.75.  Significant comorbidities. -Encourage lifestyle change to lose weight.   GERD: Continue home PPI.           DVT prophylaxis: Subcu Lovenox Code Status: Full code Family Communication: Patient and/or RN. Available if any question.  Disposition Plan: None Consultants: None  Procedures:  None  Microbiology summarized: 10/30-COVID-19 screen negative 10/30-MRSA PCR negative 10/30-blood cultures negative 10/30-urine culture without significant growth  Sch Meds:  Scheduled Meds:  [START ON 10/26/2019] azithromycin  250 mg Oral Daily   carvedilol  12.5 mg Oral BID WC   clopidogrel  75 mg Oral Daily   enoxaparin (LOVENOX) injection  40 mg Subcutaneous Q24H   gabapentin  300 mg Oral TID   insulin aspart  0-15 Units Subcutaneous TID WC   insulin aspart  0-5 Units Subcutaneous QHS   pantoprazole  40 mg Oral Daily   sodium chloride flush  3 mL Intravenous Once   Continuous Infusions:  sodium chloride 125 mL/hr at 10/25/19 1610   cefTRIAXone (ROCEPHIN)  IV     PRN  Meds:.acetaminophen **OR** acetaminophen, ondansetron **OR** ondansetron (ZOFRAN) IV, oxyCODONE  Antimicrobials: Anti-infectives (From admission, onward)   Start     Dose/Rate Route Frequency Ordered Stop   10/26/19 1000  azithromycin (ZITHROMAX) tablet 250 mg     250 mg Oral Daily 10/25/19 0713 10/30/19 0959   10/25/19 1600  vancomycin (VANCOCIN) 1,500 mg in sodium chloride 0.9 % 500 mL IVPB  Status:  Discontinued     1,500 mg 250 mL/hr over 120 Minutes Intravenous Every 24 hours 10/24/19 1502 10/25/19 0713   10/25/19 1500  cefTRIAXone (ROCEPHIN) 2 g in sodium chloride 0.9 % 100 mL IVPB     2 g 200 mL/hr over 30 Minutes Intravenous Every 24 hours 10/25/19 0713     10/25/19 1000  azithromycin (ZITHROMAX) tablet 500 mg     500 mg Oral Daily 10/25/19 0713 10/25/19 0840   10/25/19 0300  ceFEPIme (MAXIPIME) 2 g in sodium chloride 0.9 % 100 mL IVPB  Status:  Discontinued     2 g 200 mL/hr over 30 Minutes Intravenous Every 12 hours 10/24/19 1502 10/25/19 0713   10/24/19 1500  vancomycin (VANCOCIN) 2,500 mg in sodium chloride 0.9 % 500 mL IVPB     2,500 mg 250 mL/hr over 120 Minutes Intravenous  Once 10/24/19 1449 10/24/19 2013   10/24/19 1430  ceFEPIme (MAXIPIME) 2 g in sodium chloride 0.9 % 100 mL IVPB     2 g 200 mL/hr over 30 Minutes Intravenous  Once 10/24/19 1427 10/24/19 1511   10/24/19 1430  metroNIDAZOLE (FLAGYL) IVPB 500 mg     500 mg 100 mL/hr over 60 Minutes Intravenous  Once 10/24/19 1427 10/24/19 1638   10/24/19 1430  vancomycin (VANCOCIN) IVPB 1000 mg/200 mL premix  Status:  Discontinued     1,000 mg 200 mL/hr over 60 Minutes Intravenous  Once 10/24/19 1427 10/24/19 1449       I have personally reviewed the following labs and images: CBC: Recent Labs  Lab 10/24/19 1218 10/24/19 1530 10/25/19 0524  WBC 30.7* 29.0* 26.6*  HGB 15.0 13.0 11.3*  HCT 47.0 41.8 36.9*  MCV 90.9 93.5 92.9  PLT 256 209 203   BMP &GFR Recent Labs  Lab 10/24/19 1102 10/24/19 1530  10/25/19 0524  NA 137  --  137  K 4.8  --  4.1  CL 101  --  101  CO2 24  --  26  GLUCOSE 108*  --  107*  BUN 18  --  18  CREATININE 1.36* 1.32* 1.19  CALCIUM 9.7  --  8.7*  MG  --  1.4* 1.4*   Estimated Creatinine Clearance: 77.1 mL/min (by C-G formula based on SCr of 1.19 mg/dL). Liver & Pancreas: Recent Labs  Lab 10/24/19 1102  AST 20  ALT 14  ALKPHOS 55  BILITOT 0.9  PROT 7.1  ALBUMIN 4.0   Recent Labs  Lab 10/24/19 1121  LIPASE 17   No results for input(s): AMMONIA in the last 168 hours. Diabetic: Recent Labs    10/24/19 1538  HGBA1C 5.6   Recent Labs  Lab 10/24/19 1753 10/24/19 2116 10/25/19 0634  GLUCAP 105* 127* 104*   Cardiac Enzymes: No results for input(s): CKTOTAL, CKMB, CKMBINDEX, TROPONINI in the last 168 hours. No results for input(s): PROBNP in the last 8760 hours. Coagulation Profile: No results for input(s): INR, PROTIME in the last 168 hours. Thyroid Function Tests: Recent Labs    10/24/19 1500  TSH 1.099   Lipid Profile: No results for input(s): CHOL, HDL, LDLCALC, TRIG, CHOLHDL, LDLDIRECT in the last 72 hours. Anemia Panel: No results for input(s): VITAMINB12, FOLATE, FERRITIN, TIBC, IRON, RETICCTPCT in the last 72 hours. Urine analysis:    Component Value Date/Time   COLORURINE YELLOW 10/24/2019 1313   APPEARANCEUR CLEAR 10/24/2019 1313   LABSPEC 1.024 10/24/2019 1313   PHURINE 5.0 10/24/2019 1313   GLUCOSEU NEGATIVE 10/24/2019 1313   HGBUR SMALL (A) 10/24/2019 1313   BILIRUBINUR NEGATIVE 10/24/2019 1313   KETONESUR NEGATIVE 10/24/2019 1313   PROTEINUR NEGATIVE 10/24/2019 1313   NITRITE NEGATIVE 10/24/2019 1313   LEUKOCYTESUR NEGATIVE 10/24/2019 1313   Sepsis Labs: Invalid input(s): PROCALCITONIN, LACTICIDVEN  Microbiology: Recent Results (from the past 240 hour(s))  Urine culture     Status: Abnormal   Collection Time: 10/24/19 11:20 AM   Specimen: Urine, Random  Result Value Ref Range Status   Specimen  Description URINE, RANDOM  Final   Special Requests NONE  Final   Culture (A)  Final    <10,000 COLONIES/mL INSIGNIFICANT GROWTH Performed at [facility], [address] [place], [place] [address]  Report Status 10/25/2019 FINAL  Final  Blood culture (routine x 2)     Status: None (Preliminary result)   Collection Time: 10/24/19 11:25 AM   Specimen: BLOOD  Result Value Ref Range Status   Specimen Description BLOOD BLOOD LEFT WRIST  Final   Special Requests   Final    BOTTLES DRAWN AEROBIC AND ANAEROBIC Blood Culture results may not be optimal due to an inadequate volume of blood received in culture bottles   Culture   Final    NO GROWTH < 24 HOURS Performed at [facility], [address] [place], [place] [address]    Report Status PENDING  Incomplete  Blood culture (routine x 2)     Status: None (Preliminary result)   Collection Time: 10/24/19 12:18 PM   Specimen: BLOOD  Result Value Ref Range Status   Specimen Description BLOOD LEFT ANTECUBITAL  Final   Special Requests   Final    BOTTLES DRAWN AEROBIC AND ANAEROBIC Blood Culture adequate volume   Culture   Final    NO GROWTH < 24 HOURS Performed at [facility], [address], [place], [place] [address]    Report Status PENDING  Incomplete  SARS Coronavirus 2 by RT PCR (hospital order, performed in [facility] hospital lab) Nasopharyngeal Nasopharyngeal Swab     Status: None   Collection Time: 10/24/19 12:18 PM   Specimen: Nasopharyngeal Swab  Result Value Ref Range Status   SARS Coronavirus 2 NEGATIVE NEGATIVE Final    Comment: (NOTE) If result is NEGATIVE SARS-CoV-2 target nucleic acids are NOT DETECTED. The SARS-CoV-2 RNA is generally detectable in upper and lower  respiratory specimens during the acute phase of infection. The lowest  concentration of SARS-CoV-2 viral copies this assay can detect is 250  copies / mL. A negative result does not preclude SARS-CoV-2 infection  and should not be  used as the sole basis for treatment or other  patient management decisions.  A negative result may occur with  improper specimen collection / handling, submission of specimen other  than nasopharyngeal swab, presence of viral mutation(s) within the  areas targeted by this assay, and inadequate number of viral copies  (<250 copies / mL). A negative result must be combined with clinical  observations, patient history, and epidemiological information. If result is POSITIVE SARS-CoV-2 target nucleic acids are DETECTED. The SARS-CoV-2 RNA is generally detectable in upper and lower  respiratory specimens dur ing the acute phase of infection.  Positive  results are indicative of active infection with SARS-CoV-2.  Clinical  correlation with patient history and other diagnostic information is  necessary to determine patient infection status.  Positive results do  not rule out bacterial infection or co-infection with other viruses. If result is PRESUMPTIVE POSTIVE SARS-CoV-2 nucleic acids MAY BE PRESENT.   A presumptive positive result was obtained on the submitted specimen  and confirmed on repeat testing.  While 2019 novel coronavirus  (SARS-CoV-2) nucleic acids may be present in the submitted sample  additional confirmatory testing may be necessary for epidemiological  and / or clinical management purposes  to differentiate between  SARS-CoV-2 and other Sarbecovirus currently known to infect humans.  If clinically indicated additional testing with an alternate test  methodology [phone]) is advised. The SARS-CoV-2 RNA is generally  detectable in upper and lower respiratory sp ecimens during the acute  phase of infection. The expected result is Negative. Fact Sheet for Patients:  BoilerBrush.com.cy Fact Sheet for Healthcare Providers:  BankingDealers.co.za This test is not yet approved or cleared by the [place] and has been authorized  for detection and/or diagnosis of SARS-CoV-2 by FDA under an Emergency Use Authorization (EUA).  This EUA will remain in effect (meaning this test can be used) for the duration of the COVID-19 declaration under Section 564(b)(1) of the Act, 21 U.S.C. section 360bbb-3(b)(1), unless the authorization is terminated or revoked sooner. Performed at [facility], [place] [address] [place], [place] [address]     Radiology Studies: Dg Chest 2 View  Result Date: 10/25/2019 CLINICAL DATA:  Dyspnea EXAM: CHEST - 2 VIEW COMPARISON:  Chest x-rays dated 10/24/2019 and 11/22/2018. FINDINGS: Stable mild cardiomegaly. Overall cardiomediastinal silhouette is within normal limits. LEFT lower lobe airspace opacity, best seen on the lateral view within the posterior costophrenic angle. RIGHT lung is clear. No pleural effusion or pneumothorax is seen. IMPRESSION: 1. LEFT lower lobe opacity, pneumonia versus atelectasis. 2. Stable mild cardiomegaly.  No evidence of pulmonary edema. Electronically Signed   By: Franki Cabot M.D.   On: 10/25/2019 11:12   Nm Pulmonary Perfusion  Result Date: 10/24/2019 CLINICAL DATA:  Shortness of breath.  Low O2 sats. EXAM: NUCLEAR MEDICINE PERFUSION LUNG SCAN TECHNIQUE: Perfusion images were obtained in multiple projections after intravenous injection of radiopharmaceutical. Ventilation scans intentionally deferred if perfusion scan and chest x-ray adequate for interpretation during COVID 19 epidemic. RADIOPHARMACEUTICALS:  1.47 mCi Tc-81m MAA IV COMPARISON:  None. FINDINGS: No segmental defects identified. IMPRESSION: No segmental defects identified to suggest pulmonary embolus. Electronically Signed   By: Dorise Bullion III M.D   On: 10/24/2019 17:05   Vas [place] Lower Extremity Venous (dvt)  Result Date: 10/25/2019  Lower Venous Study Indications: Swelling.  Comparison Study: no prior Performing Technologist: Abram Sander RVS  Examination Guidelines: A complete evaluation  includes B-mode imaging, spectral Doppler, color Doppler, and power Doppler as needed of all accessible portions of each vessel. Bilateral testing is considered an integral part of a complete examination. Limited examinations for reoccurring indications may be performed as noted.  +-----+---------------+---------+-----------+----------+--------------+  RIGHT Compressibility Phasicity Spontaneity Properties Thrombus Aging  +-----+---------------+---------+-----------+----------+--------------+  CFV   Full            Yes       Yes                                    +-----+---------------+---------+-----------+----------+--------------+   +---------+---------------+---------+-----------+----------+--------------+  LEFT      Compressibility Phasicity Spontaneity Properties Thrombus Aging  +---------+---------------+---------+-----------+----------+--------------+  CFV       Full            Yes       Yes                                    +---------+---------------+---------+-----------+----------+--------------+  SFJ       Full                                                             +---------+---------------+---------+-----------+----------+--------------+  FV Prox   Full                                                             +---------+---------------+---------+-----------+----------+--------------+  FV Mid    Full                                                             +---------+---------------+---------+-----------+----------+--------------+  FV Distal Full                                                             +---------+---------------+---------+-----------+----------+--------------+  PFV       Full                                                             +---------+---------------+---------+-----------+----------+--------------+  POP       Full            Yes       Yes                                    +---------+---------------+---------+-----------+----------+--------------+  PTV        Full                                                             +---------+---------------+---------+-----------+----------+--------------+  PERO      Full                                                             +---------+---------------+---------+-----------+----------+--------------+     Summary: Right: No evidence of common femoral vein obstruction. Left: There is no evidence of deep vein thrombosis in the lower extremity. No cystic structure found in the popliteal fossa.  *See table(s) above for measurements and observations. Electronically signed by Waverly Ferrarihristopher Dickson MD on 10/25/2019 at 6:27:05 AM.    Final     35 minutes with more than 50% spent in reviewing records, counseling patient/family and coordinating care.  Demetrio Leighty T. Marillyn Goren [facility]  If 7PM-7AM, please contact night-coverage [email or web address] Password [email or web address] 10/25/2019, 11:20 AM

## 2019-10-26 DIAGNOSIS — R0602 Shortness of breath: Secondary | ICD-10-CM

## 2019-10-26 DIAGNOSIS — G4733 Obstructive sleep apnea (adult) (pediatric): Secondary | ICD-10-CM

## 2019-10-26 DIAGNOSIS — A419 Sepsis, unspecified organism: Principal | ICD-10-CM

## 2019-10-26 LAB — BASIC METABOLIC PANEL
Anion gap: 11 (ref 5–15)
BUN: 12 mg/dL (ref 8–23)
CO2: 24 mmol/L (ref 22–32)
Calcium: 8.6 mg/dL — ABNORMAL LOW (ref 8.9–10.3)
Chloride: 100 mmol/L (ref 98–111)
Creatinine, Ser: 1.13 mg/dL (ref 0.61–1.24)
GFR calc Af Amer: >60 mL/min (ref >60)
GFR calc non Af Amer: >60 mL/min (ref >60)
Glucose, Bld: 104 mg/dL — ABNORMAL HIGH (ref 70–99)
Potassium: 3.9 mmol/L (ref 3.5–5.1)
Sodium: 135 mmol/L (ref 135–145)

## 2019-10-26 LAB — CBC
HCT: 35.4 % — ABNORMAL LOW (ref 39.0–52.0)
Hemoglobin: 10.9 g/dL — ABNORMAL LOW (ref 13.0–17.0)
MCH: 28.8 pg (ref 26.0–34.0)
MCHC: 30.8 g/dL (ref 30.0–36.0)
MCV: 93.4 fL (ref 80.0–100.0)
Platelets: 201 10*3/uL (ref 150–400)
RBC: 3.79 MIL/uL — ABNORMAL LOW (ref 4.22–5.81)
RDW: 14.2 % (ref 11.5–15.5)
WBC: 21.1 10*3/uL — ABNORMAL HIGH (ref 4.0–10.5)
nRBC: 0 % (ref 0.0–0.2)

## 2019-10-26 LAB — MAGNESIUM: Magnesium: 1.8 mg/dL (ref 1.7–2.4)

## 2019-10-26 LAB — GLUCOSE, CAPILLARY: Glucose-Capillary: 113 mg/dL — ABNORMAL HIGH (ref 70–99)

## 2019-10-26 LAB — PROCALCITONIN: Procalcitonin: 0.38 ng/mL

## 2019-10-26 MED ORDER — AMOXICILLIN-POT CLAVULANATE 875-125 MG PO TABS: 1.0000 | ORAL_TABLET | Freq: Two times a day (BID) | ORAL | 0 refills | Status: AC

## 2019-10-26 MED ORDER — AZITHROMYCIN 250 MG PO TABS: 250.0000 mg | ORAL_TABLET | Freq: Every day | ORAL | 0 refills | Status: DC

## 2019-10-26 NOTE — Progress Notes (Signed)
Call placed to CCMD to notify of telemetry monitoring d/c.   

## 2019-10-26 NOTE — Progress Notes (Signed)
Pt escorted to family vehicle via wheelchair. Belongings/discharge paperwork in hand.

## 2019-10-26 NOTE — Progress Notes (Signed)
Patient resting comfortably during shift report. Denies complaints.  

## 2019-10-26 NOTE — Discharge Summary (Signed)
Physician Discharge Summary  George Frank ZOX:[id number] DOB: [birth year] DOA: 10/24/2019  PCP: Dennard Schaumann, MD  Admit date: 10/24/2019 Discharge date: 10/26/2019  Admitted From: Home Disposition: Home  Recommendations for Outpatient Follow-up:  1. Follow up with PCP in 1-2 weeks 2. Please obtain CBC/BMP/Mag at follow up 3. Please follow up on the following pending results: None  Home Health: None Equipment/Devices: None  Discharge Condition: Stable CODE STATUS: Full code  Follow-up Information    Dennard Schaumann, MD. Schedule an appointment as soon as possible for a visit.   Specialty: Internal Medicine Contact information: 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  He says "I did not feel short of breath.  I just could not breath when I woke up for about 10 minutes.  He called EMS and brought to ED.  Admitted for SIRS with fever of unknown origin.  In ED, tachycardic, tachypneic and febrile to 102.8.  WBC 30.7.  Lactic acid 2.4.  COVID-19 and UA negative.  CXR read as negative but with diffuse perihilar opacities compared to his x-ray last month.  CMP with mild AKI.  D-dimer elevated 1.65.  EKG with abnormal RWP but no acute ischemic finding.  Cultures obtained.  Started on broad-spectrum antibiotics (Vanco, cefepime and Flagyl) and admitted for SIRS/FUO.  VQ scan and lower extremity Dopplers ordered to exclude PE and DVT respectively.  Patient has allergy to IV contrast.  Remained stable.  Leukocytosis improving.  Lactic acidosis resolved.  Blood cultures negative so far.  Urine culture pending.  CXR as above.  PE and DVT negative on VQ  scan and lower extremity venous Dopplers.  Antibiotics deescalated to ceftriaxone and azithromycin.   On the day of discharge, patient continued to feel well.  Ambulated on room air maintain saturation greater than 95%.  Discharged home on azithromycin and Augmentin to complete 5 days course of antibiotic  See individual problems below for more on hospital course.  Subjective: No major events overnight of this morning.  No chest pain, dyspnea, cough, GI or UTI symptoms.  Feels well and ready to go home.  Had mild fever to 100.7 but improved significantly.  Leukocytosis downtrending.  Discharge Diagnoses:  Sepsis due to community-acquired pneumonia: CXR revealed some LLL opacity concerning for pneumonia.  Sepsis physiology resolving.  Fever curve downtrending.  Leukocytosis improved.  Which has improved.  Blood culture and COVID-19 negative.  Urine culture with insignificant growth.  Has no UTI symptoms.  -Treated with vancomycin, cefepime and Flagyl-transitioned to ceftriaxone and azithromycin and discharged on Augmentin and azithromycin to complete 5 days course. -Repeat CBC to ensure resolution of leukocytosis.  Dyspnea: Likely due to the above.  Could also be due to OSA. He was recently switched from Norco to oxycodone.  VQ scan and DVT Doppler negative.  Appears euvolemic.  No history of COPD.   No signs of fluid overload. -Treat pneumonia as above -Outpatient follow-up for sleep study  Leukocytosis: Likely reactive due to infectious process in the setting of fever.  Improved. -Recheck CBC at follow-up to ensure resolution  Chronic diastolic CHF: Echo in 08/2017 with EF of 55 to 60%, G1 DD.  Appears  euvolemic.  On Lasix at home. -Discharged on home Lasix.  AKI: Resolved. -Recheck BMP at follow-up  History of ischemic CVA without residual deficits -Continue home Plavix and started  Essential hypertension -Continue home Coreg and Lasix  History of LLE DVT/thrombophlebitis-off  anticoagulation.  Lower extremity venous Doppler negative for DVT.  Controlled DM-2: A1c 5.6%.. -Discharge on home metformin, gabapentin and statin.  Elevated D-dimer: VQ scan and lower extremity Dopplers negative  Chronic bilateral knee arthritis -On home oxycodone  Morbid obesity: BMI 35.75.  Significant comorbidities. -Encourage lifestyle change to lose weight.   GERD: Continue home PPI.  Discharge Instructions  Discharge Instructions    (HEART FAILURE PATIENTS) Call MD:  Anytime you have any of the following symptoms: 1) 3 pound weight gain in 24 hours or 5 pounds in 1 week 2) shortness of breath, with or without a dry hacking cough 3) swelling in the hands, feet or stomach 4) if you have to sleep on extra pillows at night in order to breathe.   Complete by: As directed    Call MD for:  difficulty breathing, headache or visual disturbances   Complete by: As directed    Call MD for:  extreme fatigue   Complete by: As directed    Call MD for:  persistant dizziness or light-headedness   Complete by: As directed    Call MD for:  persistant nausea and vomiting   Complete by: As directed    Call MD for:  severe uncontrolled pain   Complete by: As directed    Diet - low sodium heart healthy   Complete by: As directed    Diet Carb Modified   Complete by: As directed    Discharge instructions   Complete by: As directed    It has been a pleasure taking care of you! You were hospitalized with difficulty breathing and fever likely due to pneumonia.  We treated you with antibiotics.  With that, your symptoms improved to the point we think it is safe to let you go home and follow-up with your primary care doctor.  It is very important that you complete the treatment course.  Please call your primary care office to schedule hospital follow-up appointment in 1 week. Please review your new medication list and the directions before you take your medications.   Take care,   Increase  activity slowly   Complete by: As directed      Allergies as of 10/26/2019      Reactions   Ivp Dye [iodinated Diagnostic Agents] Anaphylaxis, Other (See Comments)   RESPIRATORY ARREST AND Pt reports serious reaction in 1972 at [facility] (PATIENT WAS TOLD THAT IF IT WAS ADMINISTERED TO HIM AGAIN HE "WILL DIE")   Metrizamide Anaphylaxis, Other (See Comments)   RESPIRATORY ARREST AND Pt reports serious reaction in 1972 at [facility] (PATIENT WAS TOLD THAT IF IT WAS ADMINISTERED TO HIM AGAIN HE "WILL DIE")   Sertraline Hcl Diarrhea, Other (See Comments)   Also hallucinations, made patient feel trapped, and increased anxiety      Medication List    STOP taking these medications   aspirin EC 325 MG tablet   clindamycin 300 MG capsule Commonly known as: CLEOCIN     TAKE these medications   amoxicillin-clavulanate 875-125 MG tablet Commonly known as: Augmentin Take 1 tablet by mouth 2 (two) times daily for 4 days.   azithromycin 250 MG tablet Commonly known as: Zithromax Take 1 tablet (250 mg total) by mouth  daily. Start taking on: October 27, 2019   carvedilol 12.5 MG tablet Commonly known as: COREG Take 12.5 mg by mouth 2 (two) times daily with a meal.   clopidogrel 75 MG tablet Commonly known as: PLAVIX Take 75 mg by mouth daily.   furosemide 80 MG tablet Commonly known as: LASIX Take 80 mg by mouth daily.   gabapentin 300 MG capsule Commonly known as: NEURONTIN Take 300 mg by mouth 3 (three) times daily.   metFORMIN 1000 MG tablet Commonly known as: GLUCOPHAGE Take 1,000 mg by mouth 2 (two) times daily.   omeprazole 20 MG capsule Commonly known as: PRILOSEC Take 20 mg by mouth daily.   Oxycodone HCl 10 MG Tabs Take 10 mg by mouth 3 (three) times daily as needed (pain).       Consultations:  None  Procedures/Studies:  2D Echo: None  Dg Chest 2 View  Result Date: 10/25/2019 CLINICAL DATA:  Dyspnea EXAM: CHEST - 2 VIEW COMPARISON:   Chest x-rays dated 10/24/2019 and 11/22/2018. FINDINGS: Stable mild cardiomegaly. Overall cardiomediastinal silhouette is within normal limits. LEFT lower lobe airspace opacity, best seen on the lateral view within the posterior costophrenic angle. RIGHT lung is clear. No pleural effusion or pneumothorax is seen. IMPRESSION: 1. LEFT lower lobe opacity, pneumonia versus atelectasis. 2. Stable mild cardiomegaly.  No evidence of pulmonary edema. Electronically Signed   By: Franki Cabot M.D.   On: 10/25/2019 11:12   Nm Pulmonary Perfusion  Result Date: 10/24/2019 CLINICAL DATA:  Shortness of breath.  Low O2 sats. EXAM: NUCLEAR MEDICINE PERFUSION LUNG SCAN TECHNIQUE: Perfusion images were obtained in multiple projections after intravenous injection of radiopharmaceutical. Ventilation scans intentionally deferred if perfusion scan and chest x-ray adequate for interpretation during COVID 19 epidemic. RADIOPHARMACEUTICALS:  1.47 mCi Tc-52m MAA IV COMPARISON:  None. FINDINGS: No segmental defects identified. IMPRESSION: No segmental defects identified to suggest pulmonary embolus. Electronically Signed   By: Dorise Bullion III M.D   On: 10/24/2019 17:05   Dg Chest Portable 1 View  Result Date: 10/24/2019 CLINICAL DATA:  Short of breath EXAM: PORTABLE CHEST 1 VIEW COMPARISON:  November 22, 2018 FINDINGS: The heart size and mediastinal contours are within normal limits for portable technique. Both lungs are clear. The visualized skeletal structures are unremarkable. IMPRESSION: No active disease. Electronically Signed   By: Macy Mis M.D.   On: 10/24/2019 11:21   Vas [place] Lower Extremity Venous (dvt)  Result Date: 10/25/2019  Lower Venous Study Indications: Swelling.  Comparison Study: no prior Performing Technologist: Abram Sander RVS  Examination Guidelines: A complete evaluation includes B-mode imaging, spectral Doppler, color Doppler, and power Doppler as needed of all accessible portions of each  vessel. Bilateral testing is considered an integral part of a complete examination. Limited examinations for reoccurring indications may be performed as noted.  +-----+---------------+---------+-----------+----------+--------------+  RIGHT Compressibility Phasicity Spontaneity Properties Thrombus Aging  +-----+---------------+---------+-----------+----------+--------------+  CFV   Full            Yes       Yes                                    +-----+---------------+---------+-----------+----------+--------------+   +---------+---------------+---------+-----------+----------+--------------+  LEFT      Compressibility Phasicity Spontaneity Properties Thrombus Aging  +---------+---------------+---------+-----------+----------+--------------+  CFV       Full            Yes  Yes                                    +---------+---------------+---------+-----------+----------+--------------+  SFJ       Full                                                             +---------+---------------+---------+-----------+----------+--------------+  FV Prox   Full                                                             +---------+---------------+---------+-----------+----------+--------------+  FV Mid    Full                                                             +---------+---------------+---------+-----------+----------+--------------+  FV Distal Full                                                             +---------+---------------+---------+-----------+----------+--------------+  PFV       Full                                                             +---------+---------------+---------+-----------+----------+--------------+  POP       Full            Yes       Yes                                    +---------+---------------+---------+-----------+----------+--------------+  PTV       Full                                                              +---------+---------------+---------+-----------+----------+--------------+  PERO      Full                                                             +---------+---------------+---------+-----------+----------+--------------+     Summary: Right: No evidence of common femoral vein obstruction. Left: There is no evidence of  deep vein thrombosis in the lower extremity. No cystic structure found in the popliteal fossa.  *See table(s) above for measurements and observations. Electronically signed by Waverly Ferrari MD on 10/25/2019 at 6:27:05 AM.    Final       Discharge Exam: Vitals:   10/26/19 0037 10/26/19 0345  BP: 109/68 (!) 141/76  Pulse: 78 81  Resp: 17 (!) 22  Temp: (!) 100.7 F (38.2 C) 99.6 F (37.6 C)  SpO2: (!) 88% 94%    GENERAL: No acute distress.  Appears well.  HEENT: MMM.  Vision and hearing grossly intact.  NECK: Supple.  No apparent JVD.  RESP:  No IWOB. Good air movement bilaterally. CVS:  RRR. Heart sounds normal.  ABD/GI/GU: Bowel sounds present. Soft. Non tender.  MSK/EXT:  Moves extremities. No apparent deformity or edema.  SKIN: no apparent skin lesion or wound NEURO: Awake, alert and oriented appropriately.  No gross deficit.  PSYCH: Calm. Normal affect.   The results of significant diagnostics from this hospitalization (including imaging, microbiology, ancillary and laboratory) are listed below for reference.     Microbiology: Recent Results (from the past 240 hour(s))  Urine culture     Status: Abnormal   Collection Time: 10/24/19 11:20 AM   Specimen: Urine, Random  Result Value Ref Range Status   Specimen Description URINE, RANDOM  Final   Special Requests NONE  Final   Culture (A)  Final    <10,000 COLONIES/mL INSIGNIFICANT GROWTH Performed at [facility], [address], [place], [place] [address]    Report Status 10/25/2019 FINAL  Final  Blood culture (routine x 2)     Status: None (Preliminary result)   Collection Time: 10/24/19  11:25 AM   Specimen: BLOOD  Result Value Ref Range Status   Specimen Description BLOOD BLOOD LEFT WRIST  Final   Special Requests   Final    BOTTLES DRAWN AEROBIC AND ANAEROBIC Blood Culture results may not be optimal due to an inadequate volume of blood received in culture bottles   Culture   Final    NO GROWTH 2 DAYS Performed at [facility], [address], [place], [place] [address]    Report Status PENDING  Incomplete  Blood culture (routine x 2)     Status: None (Preliminary result)   Collection Time: 10/24/19 12:18 PM   Specimen: BLOOD  Result Value Ref Range Status   Specimen Description BLOOD LEFT ANTECUBITAL  Final   Special Requests   Final    BOTTLES DRAWN AEROBIC AND ANAEROBIC Blood Culture adequate volume   Culture   Final    NO GROWTH 2 DAYS Performed at [facility], [address] [place], [place] [address]    Report Status PENDING  Incomplete  SARS Coronavirus 2 by RT PCR (hospital order, performed in [facility] hospital lab) Nasopharyngeal Nasopharyngeal Swab     Status: None   Collection Time: 10/24/19 12:18 PM   Specimen: Nasopharyngeal Swab  Result Value Ref Range Status   SARS Coronavirus 2 NEGATIVE NEGATIVE Final    Comment: (NOTE) If result is NEGATIVE SARS-CoV-2 target nucleic acids are NOT DETECTED. The SARS-CoV-2 RNA is generally detectable in upper and lower  respiratory specimens during the acute phase of infection. The lowest  concentration of SARS-CoV-2 viral copies this assay can detect is 250  copies / mL. A negative result does not preclude SARS-CoV-2 infection  and should not be used as the sole basis for treatment  or other  patient management decisions.  A negative result may occur with  improper specimen collection / handling, submission of specimen other  than nasopharyngeal swab, presence of viral mutation(s) within the  areas targeted by this assay, and inadequate number of viral copies  (<250 copies / mL). A negative  result must be combined with clinical  observations, patient history, and epidemiological information. If result is POSITIVE SARS-CoV-2 target nucleic acids are DETECTED. The SARS-CoV-2 RNA is generally detectable in upper and lower  respiratory specimens dur ing the acute phase of infection.  Positive  results are indicative of active infection with SARS-CoV-2.  Clinical  correlation with patient history and other diagnostic information is  necessary to determine patient infection status.  Positive results do  not rule out bacterial infection or co-infection with other viruses. If result is PRESUMPTIVE POSTIVE SARS-CoV-2 nucleic acids MAY BE PRESENT.   A presumptive positive result was obtained on the submitted specimen  and confirmed on repeat testing.  While 2019 novel coronavirus  (SARS-CoV-2) nucleic acids may be present in the submitted sample  additional confirmatory testing may be necessary for epidemiological  and / or clinical management purposes  to differentiate between  SARS-CoV-2 and other Sarbecovirus currently known to infect humans.  If clinically indicated additional testing with an alternate test  methodology [phone]) is advised. The SARS-CoV-2 RNA is generally  detectable in upper and lower respiratory sp ecimens during the acute  phase of infection. The expected result is Negative. Fact Sheet for Patients:  BoilerBrush.com.cy Fact Sheet for Healthcare Providers: https://pope.com/ This test is not yet approved or cleared by the [place] FDA and has been authorized for detection and/or diagnosis of SARS-CoV-2 by FDA under an Emergency Use Authorization (EUA).  This EUA will remain in effect (meaning this test can be used) for the duration of the COVID-19 declaration under Section 564(b)(1) of the Act, 21 U.S.C. section 360bbb-3(b)(1), unless the authorization is terminated or revoked sooner. Performed at [facility], [address] [place], [place] [address]      Labs: BNP (last 3 results) Recent Labs    10/25/19 1231  BNP 158.2*   Basic Metabolic Panel: Recent Labs  Lab 10/24/19 1102 10/24/19 1530 10/25/19 0524 10/26/19 0606  NA 137  --  137 135  K 4.8  --  4.1 3.9  CL 101  --  101 100  CO2 24  --  26 24  GLUCOSE 108*  --  107* 104*  BUN 18  --  18 12  CREATININE 1.36* 1.32* 1.19 1.13  CALCIUM 9.7  --  8.7* 8.6*  MG  --  1.4* 1.4* 1.8   Liver Function Tests: Recent Labs  Lab 10/24/19 1102  AST 20  ALT 14  ALKPHOS 55  BILITOT 0.9  PROT 7.1  ALBUMIN 4.0   Recent Labs  Lab 10/24/19 1121  LIPASE 17   No results for input(s): AMMONIA in the last 168 hours. CBC: Recent Labs  Lab 10/24/19 1218 10/24/19 1530 10/25/19 0524 10/25/19 1231 10/26/19 0606  WBC 30.7* 29.0* 26.6* 25.2* 21.1*  NEUTROABS  --   --   --  22.0*  --   HGB 15.0 13.0 11.3* 11.5* 10.9*  HCT 47.0 41.8 36.9* 37.6* 35.4*  MCV 90.9 93.5 92.9 93.8 93.4  PLT 256 209 203 199 201   Cardiac Enzymes: No results for input(s): CKTOTAL, CKMB, CKMBINDEX, TROPONINI in the last 168 hours. BNP: Invalid input(s): POCBNP CBG: Recent Labs  Lab 10/25/19 0634 10/25/19 1139 10/25/19 1611 10/25/19 2118 10/26/19 0627  GLUCAP 104* 145* 110* 113* 113*   D-Dimer Recent Labs    10/24/19 1214  DDIMER 1.65*   Hgb A1c Recent Labs    10/24/19 1538  HGBA1C 5.6   Lipid Profile No results for input(s): CHOL, HDL, LDLCALC, TRIG, CHOLHDL, LDLDIRECT in the last 72 hours. Thyroid function studies Recent Labs    10/24/19 1500  TSH 1.099   Anemia work up No results for input(s): VITAMINB12, FOLATE, FERRITIN, TIBC, IRON, RETICCTPCT in the last 72 hours. Urinalysis    Component Value Date/Time   COLORURINE YELLOW 10/24/2019 1313   APPEARANCEUR CLEAR 10/24/2019 1313   LABSPEC 1.024 10/24/2019 1313   PHURINE 5.0 10/24/2019 1313   GLUCOSEU NEGATIVE 10/24/2019 1313   HGBUR SMALL (A) 10/24/2019 1313    BILIRUBINUR NEGATIVE 10/24/2019 1313   KETONESUR NEGATIVE 10/24/2019 1313   PROTEINUR NEGATIVE 10/24/2019 1313   NITRITE NEGATIVE 10/24/2019 1313   LEUKOCYTESUR NEGATIVE 10/24/2019 1313   Sepsis Labs Invalid input(s): PROCALCITONIN,  WBC,  LACTICIDVEN   Time coordinating discharge: 35 minutes  SIGNED:  Almon Herculesaye T Celisse Ciulla, MD  [facility] 10/26/2019, 6:42 PM  If 7PM-7AM, please contact night-coverage [email or web address] Password [email or web address]

## 2019-10-26 NOTE — Progress Notes (Signed)
SATURATION QUALIFICATIONS: (This note is used to comply with regulatory documentation for home oxygen)  Patient Saturations on Room Air at Rest = 95%  Patient Saturations on Room Air while Ambulating = 94%  Patient Saturations on 0 Liters of oxygen while Ambulating = N/A  Please briefly explain why patient needs home oxygen:  Patient does not qualify for home oxygen.

## 2019-10-29 LAB — CULTURE, BLOOD (ROUTINE X 2)
Culture: NO GROWTH
Culture: NO GROWTH
Special Requests: ADEQUATE

## 2020-01-21 ENCOUNTER — Encounter (HOSPITAL_COMMUNITY): Payer: Self-pay

## 2020-01-21 ENCOUNTER — Emergency Department (HOSPITAL_COMMUNITY): Payer: Medicare Other

## 2020-01-21 ENCOUNTER — Emergency Department (HOSPITAL_COMMUNITY)
Admission: EM | Admit: 2020-01-21 | Discharge: 2020-01-21 | Disposition: A | Discharge status: Home / Self Care | Payer: Medicare Other | Attending: Emergency Medicine | Admitting: Emergency Medicine

## 2020-01-21 DIAGNOSIS — R609 Edema, unspecified: Secondary | ICD-10-CM | POA: Diagnosis not present

## 2020-01-21 DIAGNOSIS — Z86718 Personal history of other venous thrombosis and embolism: Secondary | ICD-10-CM | POA: Insufficient documentation

## 2020-01-21 DIAGNOSIS — R0602 Shortness of breath: Secondary | ICD-10-CM | POA: Insufficient documentation

## 2020-01-21 DIAGNOSIS — E119 Type 2 diabetes mellitus without complications: Secondary | ICD-10-CM | POA: Diagnosis not present

## 2020-01-21 DIAGNOSIS — Z87891 Personal history of nicotine dependence: Secondary | ICD-10-CM | POA: Insufficient documentation

## 2020-01-21 DIAGNOSIS — I10 Essential (primary) hypertension: Secondary | ICD-10-CM | POA: Insufficient documentation

## 2020-01-21 DIAGNOSIS — Z7984 Long term (current) use of oral hypoglycemic drugs: Secondary | ICD-10-CM | POA: Insufficient documentation

## 2020-01-21 LAB — HEPATIC FUNCTION PANEL
ALT: 16 U/L (ref 0–44)
AST: 20 U/L (ref 15–41)
Albumin: 3.9 g/dL (ref 3.5–5.0)
Alkaline Phosphatase: 54 U/L (ref 38–126)
Bilirubin, Direct: 0.1 mg/dL (ref 0.0–0.2)
Indirect Bilirubin: 0.4 mg/dL (ref 0.3–0.9)
Total Bilirubin: 0.5 mg/dL (ref 0.3–1.2)
Total Protein: 7.1 g/dL (ref 6.5–8.1)

## 2020-01-21 LAB — CBC
HCT: 41.3 % (ref 39.0–52.0)
Hemoglobin: 12.8 g/dL — ABNORMAL LOW (ref 13.0–17.0)
MCH: 27.2 pg (ref 26.0–34.0)
MCHC: 31 g/dL (ref 30.0–36.0)
MCV: 87.7 fL (ref 80.0–100.0)
Platelets: 268 10*3/uL (ref 150–400)
RBC: 4.71 MIL/uL (ref 4.22–5.81)
RDW: 14.8 % (ref 11.5–15.5)
WBC: 7.2 10*3/uL (ref 4.0–10.5)
nRBC: 0 % (ref 0.0–0.2)

## 2020-01-21 LAB — URINALYSIS, ROUTINE W REFLEX MICROSCOPIC
Bilirubin Urine: NEGATIVE
Glucose, UA: NEGATIVE mg/dL
Hgb urine dipstick: NEGATIVE
Ketones, ur: NEGATIVE mg/dL
Leukocytes,Ua: NEGATIVE
Nitrite: NEGATIVE
Protein, ur: NEGATIVE mg/dL
Specific Gravity, Urine: 1.005 (ref 1.005–1.030)
pH: 5 (ref 5.0–8.0)

## 2020-01-21 LAB — BASIC METABOLIC PANEL
Anion gap: 10 (ref 5–15)
BUN: 11 mg/dL (ref 8–23)
CO2: 26 mmol/L (ref 22–32)
Calcium: 9 mg/dL (ref 8.9–10.3)
Chloride: 100 mmol/L (ref 98–111)
Creatinine, Ser: 1.07 mg/dL (ref 0.61–1.24)
GFR calc Af Amer: >60 mL/min (ref >60)
GFR calc non Af Amer: >60 mL/min (ref >60)
Glucose, Bld: 134 mg/dL — ABNORMAL HIGH (ref 70–99)
Potassium: 4.3 mmol/L (ref 3.5–5.1)
Sodium: 136 mmol/L (ref 135–145)

## 2020-01-21 LAB — BRAIN NATRIURETIC PEPTIDE: B Natriuretic Peptide: 85.4 pg/mL (ref 0.0–100.0)

## 2020-01-21 LAB — CBG MONITORING, ED: Glucose-Capillary: 126 mg/dL — ABNORMAL HIGH (ref 70–99)

## 2020-01-21 LAB — TROPONIN I (HIGH SENSITIVITY): Troponin I (High Sensitivity): 4 ng/L (ref <18)

## 2020-01-21 MED ORDER — SODIUM CHLORIDE 0.9% FLUSH
3.0000 mL | Freq: Once | INTRAVENOUS | Status: AC
  Administered 2020-01-21: 3 mL via INTRAVENOUS

## 2020-01-21 NOTE — ED Triage Notes (Signed)
Pt comes from home via [facility], woke up with dizziness like he is going to pass out and feeling anxious after taking his BP

## 2020-01-21 NOTE — ED Notes (Signed)
IV has been taken out 

## 2020-01-21 NOTE — Discharge Instructions (Addendum)
I invite you to return immediately if you change your mind as I have not been able to rule out any potential life, limb, or organ threatening illness. Get help right away if: Your shortness of breath gets worse. You have shortness of breath when you are resting. You feel light-headed or you faint. You have a cough that is not controlled with medicines. You cough up blood. You have pain with breathing. You have pain in your chest, arms, shoulders, or abdomen. You have a fever. You cannot walk up stairs or exercise the way that you normally do.

## 2020-01-21 NOTE — ED Provider Notes (Signed)
[facility] EMERGENCY DEPARTMENT Provider Note   CSN: [id number] Arrival date & time: 01/21/20  4132     History Chief Complaint  Patient presents with  . Dizziness  . Anxiety    George Frank is a 69 y.o. male who is a very poor historian who presents the emergency department with complaint of shortness of breath.  Patient states that he does not sleep well anymore and that he has been up since 1:00 in the morning.  Around 4:00 in the morning he got up to take his morning medications and drink his coffee.  He states "something just did not feel right and I am 69 years old."  He took his blood pressure and noticed that his systolic blood pressure was 180 which made him feel anxious.  He took his medications, called EMS.  He states that when they got there he was feeling better and states did "I do not even know I called you guys out here."  He states that by the time he got there his blood pressure dropped down to about 150.  He said about 15 minutes later he started feeling "very strange again."  Patient also felt like he might pass out.  Called EMS and was transported here.  Patient does have a history of sleep apnea.  He was diagnosed several years ago and fitted for CPAP but was unable to tolerate the mask and has had no treatment since.  He has a history of peripheral edema.  He was seen about 6 months ago at [facility].  He states that he felt the swelling in his leg was due to cellulitis which was his diagnosis.  He was treated with antibiotics.  Patient states that over the past 3 days his left leg has begun swelling again.  He does not have any pain, erythema or tenderness.  He states that he has had peripheral edema for a long time.  He denies a history of heart failure.  He denies any palpitations.  HPI     Past Medical History:  Diagnosis Date  . Anxiety   . Arthritis    "knees" (12/20/2015)  . Aspiration pneumonia (HCC) 12/10/2015  . Depression   .  Diabetes mellitus without complication (HCC)   . DVT (deep venous thrombosis) (HCC) 1990s   LLE  . Enlarged heart   . GERD (gastroesophageal reflux disease)   . HCAP (healthcare-associated pneumonia) 12/20/2015  . Hyperlipidemia   . Hypertension   . Kidney stones    "passed them"  . On home oxygen therapy    "2L 24/7 last week; stopped 12/17/2015; dr said I could use it prn; had to use it again 12/19/2015"  . Phlebitis   . Sciatica   . Sciatica of right side   . Sleep apnea   . Stroke (HCC) 12/10/2015   denies residual on 12/20/2015    Patient Active Problem List   Diagnosis Date Noted  . Shortness of breath 10/24/2019  . GERD (gastroesophageal reflux disease) 10/24/2019  . Phlebitis 10/24/2019  . DVT (deep venous thrombosis) (HCC) 10/24/2019  . Fever of unknown origin 10/24/2019  . Adrenal insufficiency (HCC) 09/15/2017  . Anemia 09/13/2017  . Hypotension 09/12/2017  . Acute deep vein thrombosis (DVT) of proximal vein of left lower extremity (HCC) 09/12/2017  . AKI (acute kidney injury) (HCC) 09/12/2017  . Hyperlipemia 02/23/2017  . Diabetes (HCC) 02/23/2017  . Lacunar infarct, acute (HCC) 02/21/2016  . HCAP (healthcare-associated pneumonia) 12/20/2015  .  Illicit drug use 12/20/2015  . Pneumonia 12/20/2015  . Drug abuse (HCC)   . Acute respiratory failure with hypoxia (HCC) 12/12/2015  . Aspiration pneumonia (HCC) 12/12/2015  . OSA (obstructive sleep apnea) 12/12/2015  . CVA (cerebral infarction) 12/10/2015  . Stroke with cerebral ischemia (HCC) 12/10/2015  . Essential hypertension 12/10/2015  . Leucocytosis 12/10/2015  . Stroke (cerebrum) (HCC) 12/10/2015    Past Surgical History:  Procedure Laterality Date  . FEMUR FRACTURE SURGERY Left 1972  . FRACTURE SURGERY    . KNEE CARTILAGE SURGERY Right    scope  . NASAL SEPTUM SURGERY  ?2000s  . TONSILLECTOMY         Family History  Problem Relation Age of Onset  . Stroke Father   . Diabetes Mellitus II Neg  Hx     Social History   Tobacco Use  . Smoking status: Former Smoker    Packs/day: 3.00    Years: 23.00    Pack years: 69.00    Types: Cigarettes    Quit date: 10/27/1992    Years since quitting: 27.2  . Smokeless tobacco: Never Used  Substance Use Topics  . Alcohol use: No    Alcohol/week: 1.0 standard drinks    Types: 1 Cans of beer per week    Comment: 12/20/2015 "I'll have a few drinks once/month"  . Drug use: No    Types: Cocaine    Comment: 12/20/2015 "at parties about 3-4 times a year"    Home Medications Prior to Admission medications   Medication Sig Start Date End Date Taking? Authorizing Provider  furosemide (LASIX) 40 MG tablet Take 40 mg by mouth daily.   Yes [provider]  furosemide (LASIX) 80 MG tablet Take 80 mg by mouth daily.   Yes [provider]  gabapentin (NEURONTIN) 300 MG capsule Take 300 mg by mouth 3 (three) times daily.    Yes [provider]  metFORMIN (GLUCOPHAGE) 1000 MG tablet Take 1,000 mg by mouth 2 (two) times daily.   Yes [provider]  omeprazole (PRILOSEC) 20 MG capsule Take 20 mg by mouth daily.   Yes [provider]  oxyCODONE (ROXICODONE) 15 MG immediate release tablet Take 15 mg by mouth 4 (four) times daily as needed (pain).    Yes [provider]  clopidogrel (PLAVIX) 75 MG tablet Take 75 mg by mouth daily.    [provider]    Allergies    Ivp dye [iodinated diagnostic agents], Metrizamide, and Sertraline hcl  Review of Systems   Review of Systems Ten systems reviewed and are negative for acute change, except as noted in the HPI.   Physical Exam Updated Vital Signs BP 137/88 (BP Location: Left Arm)   Pulse 65   Temp 97.9 F (36.6 C) (Oral)   Resp 16   SpO2 94%   Physical Exam Vitals and nursing note reviewed.  Constitutional:      General: He is not in acute distress.    Appearance: He is well-developed. He is not diaphoretic.  HENT:     Head:  Normocephalic and atraumatic.  Eyes:     General: No scleral icterus.    Conjunctiva/sclera: Conjunctivae normal.  Cardiovascular:     Rate and Rhythm: Normal rate and regular rhythm.     Heart sounds: Normal heart sounds.  Pulmonary:     Effort: Pulmonary effort is normal. No respiratory distress.     Breath sounds: Normal breath sounds.  Abdominal:  Palpations: Abdomen is soft.     Tenderness: There is no abdominal tenderness.  Musculoskeletal:        General: Swelling and tenderness present.     Cervical back: Normal range of motion and neck supple.     Comments: L>>R 3+/2+ respectively peripheral edema  Skin:    General: Skin is warm and dry.  Neurological:     Mental Status: He is alert.  Psychiatric:        Behavior: Behavior normal.     ED Results / Procedures / Treatments   Labs (all labs ordered are listed, but only abnormal results are displayed) Labs Reviewed  BASIC METABOLIC PANEL - Abnormal; Notable for the following components:      Result Value   Glucose, Bld 134 (*)    All other components within normal limits  CBC - Abnormal; Notable for the following components:   Hemoglobin 12.8 (*)    All other components within normal limits  URINALYSIS, ROUTINE W REFLEX MICROSCOPIC - Abnormal; Notable for the following components:   Color, Urine COLORLESS (*)    All other components within normal limits  CBG MONITORING, ED - Abnormal; Notable for the following components:   Glucose-Capillary 126 (*)    All other components within normal limits  HEPATIC FUNCTION PANEL  BRAIN NATRIURETIC PEPTIDE  TROPONIN I (HIGH SENSITIVITY)  TROPONIN I (HIGH SENSITIVITY)  TROPONIN I (HIGH SENSITIVITY)    EKG EKG Interpretation  Date/Time:  Wednesday January 21 2020 07:21:02 EST Ventricular Rate:  60 PR Interval:    QRS Duration: 119 QT Interval:  426 QTC Calculation: 426 R Axis:   103 Text Interpretation: Sinus rhythm Short PR interval Right atrial enlargement  Nonspecific intraventricular conduction delay Since last tracing rate slower Confirmed by Dorie Rank [phone]) on 01/21/2020 7:23:27 AM   Radiology DG Chest Port 1 View  Result Date: 01/21/2020 CLINICAL DATA:  Shortness of breath EXAM: PORTABLE CHEST 1 VIEW COMPARISON:  10/25/2019 FINDINGS: The heart size and mediastinal contours are within normal limits. Both lungs are clear. No pleural effusion or pneumothorax. The visualized skeletal structures are unremarkable. IMPRESSION: No acute process in the chest. Electronically Signed   By: Macy Mis M.D.   On: 01/21/2020 07:58    Procedures Procedures (including critical care time)  Medications Ordered in ED Medications  sodium chloride flush (NS) 0.9 % injection 3 mL (3 mLs Intravenous Given 01/21/20 8657)    ED Course  I have reviewed the triage vital signs and the nursing notes.  Pertinent labs & imaging results that were available during my care of the patient were reviewed by me and considered in my medical decision making (see chart for details).  Clinical Course as of Jan 20 899  Wed Jan 21, 2020  8469 Patient here with SOB. The emergent differential diagnosis for shortness of breath includes, but is not limited to, Pulmonary edema, bronchoconstriction, Pneumonia, Pulmonary embolism, Pneumotherax/ Hemothorax, Dysrythmia, ACS.   Given his peripheral edema in the L leg concern for DVT/? PE. Secondly - patient may be having development of pulm htn/ HF due to non-treatment of known obstructive sleep apnea.  Patient also feeling mildly presyncopal- > over-diuresis given increase in recent lasix dose from 80-120 daily. Labs /imaging  ordered including cardiac work up, BNP, CXR, hepatic fx, and [place] LLE    [AH]    Clinical Course User Index [AH] Margarita Mail, PA-C   MDM Rules/Calculators/A&P  Patient was about to receive his left lower extremity DVT study.  He refused and states that he no longer wants to be  here.  Patient does not want to continue any care or work-up here in the emergency department.  I had a long discussion with the patient about the fact that I have not ruled in any life limb or organ threatening illness and that he must assume the risk.  He has the capacity to make this decision and understands the risks of leaving and will leave AGAINST MEDICAL ADVICE after long discussion here in the ER.  Date: 01/21/2020 Patient: George Frank Admitted: 01/21/2020  6:41 AM Attending Provider: Linwood Dibbles, MD  [facility] or his authorized caregiver has made the decision for the patient to leave the emergency department against the advice of Ella Golomb,PA-C.  He or his authorized caregiver has been informed and understands the inherent risks, including death.  He or his authorized caregiver has decided to accept the responsibility for this decision. [facility] and all necessary parties have been advised that he may return for further evaluation or treatment. His condition at time of discharge was Stable.  [facility] had current vital signs as follows:  Blood pressure 137/88, pulse 65, temperature 97.9 F (36.6 C), temperature source Oral, resp. rate 16, SpO2 94 %.     Arthor Captain 01/21/2020    Final Clinical Impression(s) / ED Diagnoses Final diagnoses:  SOB (shortness of breath)  Peripheral edema    Rx / DC Orders ED Discharge Orders    None       Arthor Captain, PA-C 01/21/20 5027    Linwood Dibbles, MD 01/22/20 [id number]

## 2021-02-10 ENCOUNTER — Encounter: Payer: Self-pay | Admitting: Orthopedic Surgery

## 2021-02-10 ENCOUNTER — Non-Acute Institutional Stay (SKILLED_NURSING_FACILITY): Payer: Medicare Other | Admitting: Orthopedic Surgery

## 2021-02-10 DIAGNOSIS — G44319 Acute post-traumatic headache, not intractable: Secondary | ICD-10-CM | POA: Diagnosis not present

## 2021-02-10 NOTE — Progress Notes (Signed)
Location:  [facility] Room Number: 515/P Place of Service:  SNF [phone]) Provider: Hazle Nordmann, AGNP-C  Dennard Schaumann, MD  Patient Care Team: Dennard Schaumann, MD as PCP - General (Internal Medicine)  Extended Emergency Contact Information Primary Emergency Contact: Graciella Freer Mobile Phone: [phone] Relation: Daughter  Goals of care: Advanced Directive information Advanced Directives 10/24/2019  Does Patient Have a Medical Advance Directive? No  Would patient like information on creating a medical advance directive? -     Chief Complaint  Patient presents with  . Acute Visit    Headache    HPI:  Pt is a 70 y.o. male seen today for acute visit for headache.   He has been a resident of [facility] since 02/16. PMH includes: DVT, hypertension, stroke, OSA, GERD, acute lacunar infarct, hyperlipidemia, drug abuse, and suicide ideation.   He was recently hospitalized at [facility] 02/15-02/16 for fall and extreme weakness. During the fall he hit the back of his head. CT head unremarkable. Lower extremity doppler positive for acute DVT of LT FV mid and distal, PTV and peroneal V. Positive chronic DVT RT CFV, PFV, FV, pop V, PTV, and peroneal V. He was kept on his eliquis and oxycodone was discontinued.   Today, facility nurse reports he has a headache unresolved with tylenol. He claims the back of his head hurts prohibiting him from sleeping. Headache began last night. Resolved by tramadol ordered by on call provider. Requesting for more tramadol which was given to him by the on call provider. Rates pain 4/10. He has not tried any other interventions to relieve pain.    Past Medical History:  Diagnosis Date  . Anxiety   . Arthritis    "knees" (12/20/2015)  . Aspiration pneumonia (HCC) 12/10/2015  . Depression   . Diabetes mellitus without complication (HCC)   . DVT (deep venous thrombosis) (HCC) 1990s    LLE  . Enlarged heart   . GERD (gastroesophageal reflux disease)   . HCAP (healthcare-associated pneumonia) 12/20/2015  . Hyperlipidemia   . Hypertension   . Kidney stones    "passed them"  . On home oxygen therapy    "2L 24/7 last week; stopped 12/17/2015; dr said I could use it prn; had to use it again 12/19/2015"  . Phlebitis   . Sciatica   . Sciatica of right side   . Sleep apnea   . Stroke (HCC) 12/10/2015   denies residual on 12/20/2015   Past Surgical History:  Procedure Laterality Date  . FEMUR FRACTURE SURGERY Left 1972  . FRACTURE SURGERY    . KNEE CARTILAGE SURGERY Right    scope  . NASAL SEPTUM SURGERY  ?2000s  . TONSILLECTOMY      Allergies  Allergen Reactions  . Ivp Dye [Iodinated Diagnostic Agents] Anaphylaxis and Other (See Comments)    RESPIRATORY ARREST AND Pt reports serious reaction in 1972 at [facility] (PATIENT WAS TOLD THAT IF IT WAS ADMINISTERED TO HIM AGAIN HE "WILL DIE")  . Metrizamide Anaphylaxis and Other (See Comments)    RESPIRATORY ARREST AND Pt reports serious reaction in 1972 at [facility] (PATIENT WAS TOLD THAT IF IT WAS ADMINISTERED TO HIM AGAIN HE "WILL DIE")  . Sertraline Hcl Diarrhea and Other (See Comments)    Also hallucinations, made patient feel trapped, and increased anxiety    Outpatient Encounter Medications as of 02/10/2021  Medication Sig  . acetaminophen (TYLENOL) 500 MG tablet Take  1,000 mg by mouth 3 (three) times daily as needed.  Marland Kitchen apixaban (ELIQUIS) 5 MG TABS tablet Take 5 mg by mouth 2 (two) times daily.  . carvedilol (COREG) 12.5 MG tablet Take 12.5 mg by mouth 2 (two) times daily with a meal.  . fenofibrate (TRICOR) 145 MG tablet Take 145 mg by mouth daily.  Marland Kitchen gabapentin (NEURONTIN) 300 MG capsule Take 300 mg by mouth 3 (three) times daily.   . [DISCONTINUED] clopidogrel (PLAVIX) 75 MG tablet Take 75 mg by mouth daily.  . [DISCONTINUED] furosemide (LASIX) 40 MG tablet Take 40 mg by mouth daily.  .  [DISCONTINUED] furosemide (LASIX) 80 MG tablet Take 80 mg by mouth daily.  . [DISCONTINUED] metFORMIN (GLUCOPHAGE) 1000 MG tablet Take 1,000 mg by mouth 2 (two) times daily.  . [DISCONTINUED] omeprazole (PRILOSEC) 20 MG capsule Take 20 mg by mouth daily.  . [DISCONTINUED] oxyCODONE (ROXICODONE) 15 MG immediate release tablet Take 15 mg by mouth 4 (four) times daily as needed (pain).    No facility-administered encounter medications on file as of 02/10/2021.    Review of Systems  Constitutional: Negative for activity change, appetite change and fever.  HENT: Negative for congestion and sinus pain.   Respiratory: Negative for cough, shortness of breath and wheezing.   Cardiovascular: Negative for chest pain and leg swelling.  Neurological: Positive for weakness and headaches. Negative for dizziness.  Psychiatric/Behavioral: Negative for dysphoric mood. The patient is not nervous/anxious.     Immunization History  Administered Date(s) Administered  . Influenza-Unspecified 11/25/2015  . PFIZER(Purple Top)SARS-COV-2 Vaccination 01/29/2020, 02/19/2020  . Pneumococcal Polysaccharide-23 12/21/2015   Pertinent  Health Maintenance Due  Topic Date Due  . FOOT EXAM  Never done  . OPHTHALMOLOGY EXAM  Never done  . URINE MICROALBUMIN  Never done  . COLONOSCOPY (Pts 45-6yrs Insurance coverage will need to be confirmed)  Never done  . PNA vac Low Risk Adult (2 of 2 - PCV13) 12/20/2016  . HEMOGLOBIN A1C  04/23/2020  . INFLUENZA VACCINE  07/25/2020   Fall Risk  02/23/2017 02/21/2016  Falls in the past year? No No   Functional Status Survey:    Vitals:   02/10/21 1230  BP: 118/79  Pulse: 70  Temp: 97.9 F (36.6 C)  Weight: 245 lb (111.1 kg)   Body mass index is 34.17 kg/m. Physical Exam Vitals reviewed.  Constitutional:      General: He is not in acute distress. Eyes:     General:        Right eye: No discharge.        Left eye: No discharge.     Extraocular Movements: Extraocular  movements intact.     Pupils: Pupils are equal, round, and reactive to light.  Cardiovascular:     Rate and Rhythm: Normal rate and regular rhythm.     Pulses: Normal pulses.     Heart sounds: Normal heart sounds. No murmur heard.   Pulmonary:     Effort: Pulmonary effort is normal. No respiratory distress.     Breath sounds: Normal breath sounds. No wheezing.  Neurological:     General: No focal deficit present.     Mental Status: He is alert and oriented to person, place, and time.     Motor: Weakness present.     Gait: Gait abnormal.     Comments: walker  Psychiatric:        Mood and Affect: Mood normal.        Behavior: Behavior  normal.     Labs reviewed: No results for input(s): NA, K, CL, CO2, GLUCOSE, BUN, CREATININE, CALCIUM, MG, PHOS in the last 8760 hours. No results for input(s): AST, ALT, ALKPHOS, BILITOT, PROT, ALBUMIN in the last 8760 hours. No results for input(s): WBC, NEUTROABS, HGB, HCT, MCV, PLT in the last 8760 hours. Lab Results  Component Value Date   TSH 1.099 10/24/2019   Lab Results  Component Value Date   HGBA1C 5.6 10/24/2019   Lab Results  Component Value Date   CHOL 123 12/10/2015   HDL 27 (L) 12/10/2015   LDLCALC 80 12/10/2015   TRIG 81 12/10/2015   CHOLHDL 4.6 12/10/2015    Significant Diagnostic Results in last 30 days:  No results found.  Assessment/Plan 1. Acute post-traumatic headache, not intractable - exam unremarkable, vitals stable - he was hospitalized for recent fall and drug abuse - oxycodone discontinued during recent hospitalization - he remains on eliquis for DVT - will increase tylenol to 1000 mg tid prn - may apply warm compress/ warm washcloth to head prn - recommend warm shower  - may use supplemental oxygen 2-6 liters prn      Family/ staff Communication: Plan discussed with patient and facility nurse  Labs/tests ordered: none

## 2021-02-11 ENCOUNTER — Encounter: Payer: Self-pay | Admitting: Internal Medicine

## 2021-02-11 ENCOUNTER — Non-Acute Institutional Stay (SKILLED_NURSING_FACILITY): Payer: Medicare Other | Admitting: Internal Medicine

## 2021-02-11 DIAGNOSIS — I825Z3 Chronic embolism and thrombosis of unspecified deep veins of distal lower extremity, bilateral: Secondary | ICD-10-CM

## 2021-02-11 DIAGNOSIS — I951 Orthostatic hypotension: Secondary | ICD-10-CM

## 2021-02-11 DIAGNOSIS — E782 Mixed hyperlipidemia: Secondary | ICD-10-CM

## 2021-02-11 DIAGNOSIS — L89130 Pressure ulcer of right lower back, unstageable: Secondary | ICD-10-CM

## 2021-02-11 DIAGNOSIS — I872 Venous insufficiency (chronic) (peripheral): Secondary | ICD-10-CM

## 2021-02-11 DIAGNOSIS — I679 Cerebrovascular disease, unspecified: Secondary | ICD-10-CM

## 2021-02-11 DIAGNOSIS — E1142 Type 2 diabetes mellitus with diabetic polyneuropathy: Secondary | ICD-10-CM

## 2021-02-11 DIAGNOSIS — E1149 Type 2 diabetes mellitus with other diabetic neurological complication: Secondary | ICD-10-CM

## 2021-02-11 NOTE — Progress Notes (Signed)
Provider:  Gwenith Frank. Renato Gails, D.O., C.M.D. Location:  [facility] Room Number: [phone] Place of Service:  SNF (31)  PCP: Dennard Schaumann, MD Patient Care Team: Dennard Schaumann, MD as PCP - General (Internal Medicine)  Extended Emergency Contact Information Primary Emergency Contact: Graciella Freer Mobile Phone: [phone] Relation: Daughter  Code Status: DNR Goals of Care: Advanced Directive information Advanced Directives 02/11/2021  Does Patient Have a Medical Advance Directive? No  Would patient like information on creating a medical advance directive? -   Chief Complaint  Patient presents with  . New Admit To SNF    New Admit to [facility] SN[facility]     HPI: Patient is a 70 y.o. male seen today for admission to [facility] status post hospitalization from February 15 through February 16 at [facility].  George Frank has a past medical history significant for hypertension, type 2 diabetes, chronic opioid use, prior DVT, mixed hyperlipidemia.  Prior to his admission he had sustained a fall at home 3 days before.  Reported his legs gave out and he fell backward in the kitchen landing on his bottom and hitting his head on the floor.  He did not lose consciousness and had no focal weakness afterward.  He had no bowel or bladder incontinence, palpitations or chest pain prior to the fall.  He was unable to get up due to his weakness and was on the floor for 5 hours until he crawled up to bed.  After the fall he continued to feel weak and would lose control of his ability to ambulate after just a couple of steps.  He had no headaches, blurred vision, dizziness, nausea, vomiting, dysuria or diarrhea.  He had no respiratory symptoms.  In the emergency department his vitals were hemodynamically stable and he was afebrile.  Labs revealed mild leukocytosis negative UA, normal liver and electrolytes and CT of the scan of the head and neck was unremarkable for any  acute intracranial process.  Due to his difficulty ambulating and generalized weakness he was admitted for PT OT evaluation.  Apparently he had admitted to an occipital headache to the hospitalist taking care of him.  MRI did reveal chronic pontine lacunar infarcts, moderate cerebral atrophy and generalized chronic small vessel ischemic disease.  He was noted to be markedly orthostatic and his Lasix was discontinued and Ace wraps applied to both lower extremities.  Has bilateral chronic DVT with venous insufficiency.  Compression stockings were recommended upon discharge.  His oxycodone was discontinued at the hospital.  He's here for PT, OT, ST.    He also is noted to have an unstageable pressure injury to his left lower back (called ischium at hospital).  Getting santyl and foam dressing here.  Of note, adrenal insufficiency is mentioned in his [facility] record.    He's had his pfizer covid vaccines 2/4 and 2/25, but no booster listed.  When seen, he continued to complain of burning pain in his feet, worse at night and not as bad when walking on them.  Tells me has has compression stockings at home, but none have been given to him here.  His right lower back wound has a scab on it now.  Past Medical History:  Diagnosis Date  . Anxiety   . Arthritis    "knees" (12/20/2015)  . Aspiration pneumonia (HCC) 12/10/2015  . Depression   . Diabetes mellitus without complication (HCC)   . DVT (deep venous thrombosis) (HCC)  1990s   LLE  . Enlarged heart   . GERD (gastroesophageal reflux disease)   . HCAP (healthcare-associated pneumonia) 12/20/2015  . Hyperlipidemia   . Hypertension   . Kidney stones    "passed them"  . On home oxygen therapy    "2L 24/7 last week; stopped 12/17/2015; dr said I could use it prn; had to use it again 12/19/2015"  . Phlebitis   . Sciatica   . Sciatica of right side   . Sleep apnea   . Stroke (HCC) 12/10/2015   denies residual on 12/20/2015   Past Surgical  History:  Procedure Laterality Date  . FEMUR FRACTURE SURGERY Left 1972  . FRACTURE SURGERY    . KNEE CARTILAGE SURGERY Right    scope  . NASAL SEPTUM SURGERY  ?2000s  . TONSILLECTOMY      Social History   Socioeconomic History  . Marital status: Divorced    Spouse name: Not on file  . Number of children: Not on file  . Years of education: Not on file  . Highest education level: Not on file  Occupational History  . Not on file  Tobacco Use  . Smoking status: Former Smoker    Packs/day: 3.00    Years: 23.00    Pack years: 69.00    Types: Cigarettes    Quit date: 10/27/1992    Years since quitting: 28.3  . Smokeless tobacco: Never Used  Vaping Use  . Vaping Use: Never used  Substance and Sexual Activity  . Alcohol use: No    Alcohol/week: 1.0 standard drink    Types: 1 Cans of beer per week    Comment: 12/20/2015 "I'll have a few drinks once/month"  . Drug use: No    Types: Cocaine    Comment: 12/20/2015 "at parties about 3-4 times a year"  . Sexual activity: Not Currently  Other Topics Concern  . Not on file  Social History Narrative  . Not on file   Social Determinants of Health   Financial Resource Strain: Not on file  Food Insecurity: Not on file  Transportation Needs: Not on file  Physical Activity: Not on file  Stress: Not on file  Social Connections: Not on file    reports that he quit smoking about 28 years ago. His smoking use included cigarettes. He has a 69.00 pack-year smoking history. He has never used smokeless tobacco. He reports that he does not drink alcohol and does not use drugs.  Functional Status Survey:    Family History  Problem Relation Age of Onset  . Stroke Father   . Diabetes Mellitus II Neg Hx     Health Maintenance  Topic Date Due  . Hepatitis C Screening  Never done  . FOOT EXAM  Never done  . OPHTHALMOLOGY EXAM  Never done  . URINE MICROALBUMIN  Never done  . TETANUS/TDAP  Never done  . COLONOSCOPY (Pts 45-2575yrs  Insurance coverage will need to be confirmed)  Never done  . PNA vac Low Risk Adult (2 of 2 - PCV13) 12/20/2016  . HEMOGLOBIN A1C  04/23/2020  . INFLUENZA VACCINE  07/25/2020  . COVID-19 Vaccine (3 - Booster for Pfizer series) 08/18/2020    Allergies  Allergen Reactions  . Ivp Dye [Iodinated Diagnostic Agents] Anaphylaxis and Other (See Comments)    RESPIRATORY ARREST AND Pt [facility] (PATIENT WAS TOLD THAT IF IT WAS ADMINISTERED TO HIM AGAIN HE "WILL DIE")  . Metrizamide  Anaphylaxis and Other (See Comments)    RESPIRATORY ARREST AND Pt reports serious reaction in 1972 at [facility] (PATIENT WAS TOLD THAT IF IT WAS ADMINISTERED TO HIM AGAIN HE "WILL DIE")  . Sertraline Hcl Diarrhea and Other (See Comments)    Also hallucinations, made patient feel trapped, and increased anxiety    Outpatient Encounter Medications as of 02/11/2021  Medication Sig  . acetaminophen (TYLENOL) 500 MG tablet Take 1,000 mg by mouth 3 (three) times daily as needed.  Marland Kitchen apixaban (ELIQUIS) 5 MG TABS tablet Take 5 mg by mouth 2 (two) times daily.  . carvedilol (COREG) 12.5 MG tablet Take 12.5 mg by mouth 2 (two) times daily with a meal.  . fenofibrate (TRICOR) 145 MG tablet Take 145 mg by mouth daily.  Marland Kitchen gabapentin (NEURONTIN) 300 MG capsule Take 300 mg by mouth 3 (three) times daily.    No facility-administered encounter medications on file as of 02/11/2021.    Review of Systems  Constitutional: Negative for chills and fever.  HENT: Negative for congestion and sore throat.   Eyes: Negative for blurred vision.  Respiratory: Negative for cough and shortness of breath.   Cardiovascular: Positive for leg swelling. Negative for chest pain, palpitations and PND.  Gastrointestinal: Negative for abdominal pain and constipation.  Genitourinary: Negative for dysuria.  Musculoskeletal: Positive for back pain and falls.  Skin: Negative for itching and rash.        Pressure injury  Neurological: Positive for weakness. Negative for dizziness and loss of consciousness.  Endo/Heme/Allergies: Bruises/bleeds easily.  Psychiatric/Behavioral: Negative for depression and memory loss.    Vitals:   02/11/21 0948  BP: 115/72  Pulse: 74  Temp: 97.7 F (36.5 C)  Weight: 245 lb (111.1 kg)  Height: 5\' 11"  (1.803 m)   Body mass index is 34.17 kg/m. Physical Exam Vitals reviewed.  Constitutional:      General: He is not in acute distress.    Appearance: He is not toxic-appearing.  HENT:     Head: Normocephalic.     Right Ear: External ear normal.     Left Ear: External ear normal.     Nose: Nose normal.     Mouth/Throat:     Pharynx: Oropharynx is clear.  Eyes:     Conjunctiva/sclera: Conjunctivae normal.     Pupils: Pupils are equal, round, and reactive to light.  Cardiovascular:     Rate and Rhythm: Normal rate and regular rhythm.     Pulses: Normal pulses.     Heart sounds: Normal heart sounds.  Pulmonary:     Effort: Pulmonary effort is normal.     Breath sounds: Normal breath sounds. No wheezing, rhonchi or rales.  Abdominal:     General: Bowel sounds are normal. There is no distension.     Palpations: Abdomen is soft.     Tenderness: There is no abdominal tenderness. There is no guarding or rebound.  Musculoskeletal:        General: No tenderness. Normal range of motion.     Cervical back: Neck supple.     Right lower leg: Edema present.     Left lower leg: Edema present.     Comments: Purple discoloration of lower legs and feet, increased swelling of left vs right  Lymphadenopathy:     Cervical: No cervical adenopathy.  Skin:    General: Skin is warm and dry.     Comments: Scabbed over area about 1 inch square on right lower back without  erythema, warmth or drainage  Neurological:     General: No focal deficit present.     Mental Status: He is alert and oriented to person, place, and time.     Sensory: Sensory deficit present.      Motor: Weakness present.     Gait: Gait abnormal.     Comments: Uses walker and wheelchair  Psychiatric:        Mood and Affect: Mood normal.     Labs reviewed: Basic Metabolic Panel: No results for input(s): NA, K, CL, CO2, GLUCOSE, BUN, CREATININE, CALCIUM, MG, PHOS in the last 8760 hours. Liver Function Tests: No results for input(s): AST, ALT, ALKPHOS, BILITOT, PROT, ALBUMIN in the last 8760 hours. No results for input(s): LIPASE, AMYLASE in the last 8760 hours. No results for input(s): AMMONIA in the last 8760 hours. CBC: No results for input(s): WBC, NEUTROABS, HGB, HCT, MCV, PLT in the last 8760 hours. Cardiac Enzymes: No results for input(s): CKTOTAL, CKMB, CKMBINDEX, TROPONINI in the last 8760 hours. BNP: Invalid input(s): POCBNP Lab Results  Component Value Date   HGBA1C 5.6 10/24/2019   Lab Results  Component Value Date   TSH 1.099 10/24/2019   Lab Results  Component Value Date   VITAMINB12 475 09/13/2017   Lab Results  Component Value Date   FOLATE 8.4 09/13/2017   Lab Results  Component Value Date   IRON 41 (L) 09/13/2017   TIBC 259 09/13/2017   FERRITIN 35 09/13/2017    Imaging and Procedures obtained prior to SNF admission: DG Chest Port 1 View  Result Date: 01/21/2020 CLINICAL DATA:  Shortness of breath EXAM: PORTABLE CHEST 1 VIEW COMPARISON:  10/25/2019 FINDINGS: The heart size and mediastinal contours are within normal limits. Both lungs are clear. No pleural effusion or pneumothorax. The visualized skeletal structures are unremarkable. IMPRESSION: No acute process in the chest. Electronically Signed   By: Guadlupe Spanish M.D.   On: 01/21/2020 07:58    Assessment/Plan 1. Orthostatic hypotension -occasional dizziness, but not consistent, has to get up slowly and would be helped if he was wearing the compression stockings recommended at hospital--order written  2. Lower leg DVT (deep venous thromboembolism), chronic, bilateral (HCC) -felt to have  caused initial venous disease and swelling, cont eliquis  3. Venous insufficiency -elevate feet at rest, use compression stockings medium strength to knees on in am and off at hs  4. Cerebrovascular disease -cont to work on bp and lipid control with coreg, fenofibrate  5. Mixed hyperlipidemia -cont fenofibrate for triglycerides, monitor FLP  6. Type 2 diabetes mellitus with neurological complications (HCC) -not on diabetes meds, monitor hba1c  7. Unstageable pressure ulcer of right lower back (HCC) -has large eschar over it today -cont wound care per [facility]  8.  Diabetic peripheral neuropathy -off oxycodone now since hospitalization -increase gabapentin to 400mg  in am and hs and 300mg  mid-day  Family/ staff Communication: d/w snf nurse  Labs/tests ordered:  Cbc, bmp at one week  Natallie Ravenscroft L. Momoko Slezak, D.O. [facility] [address], [address] [place] Cell Phone (Mon-Fri 8am-5pm):  [phone] On Call:  [phone] & follow prompts after 5pm & weekends Office Phone:  [phone] Office Fax:  [phone]

## 2021-02-15 DIAGNOSIS — I825Z3 Chronic embolism and thrombosis of unspecified deep veins of distal lower extremity, bilateral: Secondary | ICD-10-CM | POA: Insufficient documentation

## 2021-02-15 DIAGNOSIS — L89130 Pressure ulcer of right lower back, unstageable: Secondary | ICD-10-CM | POA: Insufficient documentation

## 2021-02-15 DIAGNOSIS — I872 Venous insufficiency (chronic) (peripheral): Secondary | ICD-10-CM | POA: Insufficient documentation

## 2021-02-15 DIAGNOSIS — I679 Cerebrovascular disease, unspecified: Secondary | ICD-10-CM | POA: Insufficient documentation

## 2021-02-15 LAB — BASIC METABOLIC PANEL
BUN: 12 (ref 4–21)
CO2: 28 — AB (ref 13–22)
Chloride: 106 (ref 99–108)
Creatinine: 0.9 (ref 0.6–1.3)
Glucose: 107
Potassium: 4.4 (ref 3.4–5.3)
Sodium: 140 (ref 137–147)

## 2021-02-15 LAB — COMPREHENSIVE METABOLIC PANEL
Calcium: 9.1 (ref 8.7–10.7)
GFR calc Af Amer: >90
GFR calc non Af Amer: 86.97

## 2021-02-15 LAB — IRON,TIBC AND FERRITIN PANEL
%SAT: 27.19
Ferritin: 44.52
Iron: 72
TIBC: 266
UIBC: 193

## 2021-02-15 LAB — CBC AND DIFFERENTIAL
HCT: 41 (ref 41–53)
Hemoglobin: 13.5 (ref 13.5–17.5)
Platelets: 183 (ref 150–399)
WBC: 6.9

## 2021-02-15 LAB — CBC: RBC: 4.78 (ref 3.87–5.11)

## 2021-02-16 ENCOUNTER — Non-Acute Institutional Stay (SKILLED_NURSING_FACILITY): Payer: Medicare Other | Admitting: Orthopedic Surgery

## 2021-02-16 ENCOUNTER — Encounter: Payer: Self-pay | Admitting: Orthopedic Surgery

## 2021-02-16 DIAGNOSIS — I951 Orthostatic hypotension: Secondary | ICD-10-CM | POA: Diagnosis not present

## 2021-02-16 DIAGNOSIS — I872 Venous insufficiency (chronic) (peripheral): Secondary | ICD-10-CM

## 2021-02-16 DIAGNOSIS — L89130 Pressure ulcer of right lower back, unstageable: Secondary | ICD-10-CM

## 2021-02-16 DIAGNOSIS — I679 Cerebrovascular disease, unspecified: Secondary | ICD-10-CM

## 2021-02-16 DIAGNOSIS — I825Z3 Chronic embolism and thrombosis of unspecified deep veins of distal lower extremity, bilateral: Secondary | ICD-10-CM

## 2021-02-16 DIAGNOSIS — G44319 Acute post-traumatic headache, not intractable: Secondary | ICD-10-CM

## 2021-02-16 DIAGNOSIS — E1149 Type 2 diabetes mellitus with other diabetic neurological complication: Secondary | ICD-10-CM

## 2021-02-16 DIAGNOSIS — E782 Mixed hyperlipidemia: Secondary | ICD-10-CM

## 2021-02-16 MED ORDER — APIXABAN 5 MG PO TABS: 5.0000 mg | ORAL_TABLET | Freq: Two times a day (BID) | ORAL | 0 refills | Status: AC

## 2021-02-16 MED ORDER — GABAPENTIN 300 MG PO CAPS: 300.0000 mg | ORAL_CAPSULE | Freq: Every day | ORAL | 0 refills | Status: AC

## 2021-02-16 MED ORDER — FENOFIBRATE 145 MG PO TABS: 145.0000 mg | ORAL_TABLET | Freq: Every day | ORAL | 0 refills | Status: AC

## 2021-02-16 MED ORDER — GABAPENTIN 400 MG PO CAPS: 400.0000 mg | ORAL_CAPSULE | Freq: Two times a day (BID) | ORAL | 0 refills | Status: AC

## 2021-02-16 MED ORDER — CARVEDILOL 12.5 MG PO TABS: 12.5000 mg | ORAL_TABLET | Freq: Two times a day (BID) | ORAL | 0 refills | Status: AC

## 2021-02-16 NOTE — Progress Notes (Signed)
Location:    [facility] Room Number: 515/P Place of Service:  SNF [phone])  Provider: Hazle Nordmann NP  PCP: Dennard Schaumann, MD Patient Care Team: Dennard Schaumann, MD as PCP - General (Internal Medicine)  Extended Emergency Contact Information Primary Emergency Contact: Graciella Freer Mobile Phone: [phone] Relation: Daughter  Code Status: DNR Goals of care:  Advanced Directive information Advanced Directives 02/16/2021  Does Patient Have a Medical Advance Directive? Yes  Type of Advance Directive Out of facility DNR (pink MOST or yellow form)  Does patient want to make changes to medical advance directive? No - Patient declined  Would patient like information on creating a medical advance directive? -  Pre-existing out of facility DNR order (yellow form or pink MOST form) Yellow form placed in chart (order not valid for inpatient use)     Allergies  Allergen Reactions  . Ivp Dye [Iodinated Diagnostic Agents] Anaphylaxis and Other (See Comments)    RESPIRATORY ARREST AND Pt reports serious reaction in 1972 at [facility] (PATIENT WAS TOLD THAT IF IT WAS ADMINISTERED TO HIM AGAIN HE "WILL DIE")  . Metrizamide Anaphylaxis and Other (See Comments)    RESPIRATORY ARREST AND Pt reports serious reaction in 1972 at [facility] (PATIENT WAS TOLD THAT IF IT WAS ADMINISTERED TO HIM AGAIN HE "WILL DIE")  . Sertraline Hcl Diarrhea and Other (See Comments)    Also hallucinations, made patient feel trapped, and increased anxiety    Chief Complaint  Patient presents with  . Discharge Note    Discharge Visit     HPI:  70 y.o. male seen today for discharge evaluation.   He has been a resident of [facility] since 02/16. PMH includes: DVT, hypertension, stroke, OSA, GERD, acute lacunar infarct, hyperlipidemia, drug abuse, and suicide ideation.  Recently hospitalized at [facility] 02/15-02/16 for fall and  weakness. During the fall he hit the back of his head. CT head unremarkable. MRI showed chronic pontine lacunar infarcts, moderate cerebral atrophy and generalized chronic small vessel cerebrovascular disease. Doppler of lower extremities positive for bilateral DVT. In addition he had profound orthostatic hypotension, furosemide discontinued. PT recommended snf for additional therapy.   Today he is alert and oriented x 4. Follows commands and can express needs. During the beginning of his stay he complained of headache. Tylenol 1000 mg tid prn started. He reports headaches have improved with tylenol. He has also reported increased neuropathy during his stay. Nighttime gabapentin increased to 400 mg. Labs negative for iron deficiency anemia. Ambulating about 150 ft with walker. Independent with ADLs. Eating 3 meals daily. Last bowel movement this morning. Denies chest pain or shortness of breath.   Continues to receive daily wound care from facility nurse for unstageable pressure injury to right lower back.   No recent falls reported.   Facility nurse does not report any concerns, vitals stable.   At this time, he plans to discharge home 02/27. Daughters will assist with transportation and care. Home health PT/OT/Nursing ordered. No DME needs. I have advised him to follow up with PCP in 1-2 weeks. I have also advised him to purchase a life alert.     Past Medical History:  Diagnosis Date  . Anxiety   . Arthritis    "knees" (12/20/2015)  . Aspiration pneumonia (HCC) 12/10/2015  . Depression   . Diabetes mellitus without complication (HCC)   . DVT (deep venous thrombosis) (HCC) 1990s   LLE  .  Enlarged heart   . GERD (gastroesophageal reflux disease)   . HCAP (healthcare-associated pneumonia) 12/20/2015  . Hyperlipidemia   . Hypertension   . Kidney stones    "passed them"  . On home oxygen therapy    "2L 24/7 last week; stopped 12/17/2015; dr said I could use it prn; had to use it again  12/19/2015"  . Phlebitis   . Sciatica   . Sciatica of right side   . Sleep apnea   . Stroke (HCC) 12/10/2015   denies residual on 12/20/2015    Past Surgical History:  Procedure Laterality Date  . FEMUR FRACTURE SURGERY Left 1972  . FRACTURE SURGERY    . KNEE CARTILAGE SURGERY Right    scope  . NASAL SEPTUM SURGERY  ?2000s  . TONSILLECTOMY        reports that he quit smoking about 28 years ago. His smoking use included cigarettes. He has a 69.00 pack-year smoking history. He has never used smokeless tobacco. He reports that he does not drink alcohol and does not use drugs. Social History   Socioeconomic History  . Marital status: Divorced    Spouse name: Not on file  . Number of children: Not on file  . Years of education: Not on file  . Highest education level: Not on file  Occupational History  . Not on file  Tobacco Use  . Smoking status: Former Smoker    Packs/day: 3.00    Years: 23.00    Pack years: 69.00    Types: Cigarettes    Quit date: 10/27/1992    Years since quitting: 28.3  . Smokeless tobacco: Never Used  Vaping Use  . Vaping Use: Never used  Substance and Sexual Activity  . Alcohol use: No    Alcohol/week: 1.0 standard drink    Types: 1 Cans of beer per week    Comment: 12/20/2015 "I'll have a few drinks once/month"  . Drug use: No    Types: Cocaine    Comment: 12/20/2015 "at parties about 3-4 times a year"  . Sexual activity: Not Currently  Other Topics Concern  . Not on file  Social History Narrative  . Not on file   Social Determinants of Health   Financial Resource Strain: Not on file  Food Insecurity: Not on file  Transportation Needs: Not on file  Physical Activity: Not on file  Stress: Not on file  Social Connections: Not on file  Intimate Partner Violence: Not on file   Functional Status Survey:    Allergies  Allergen Reactions  . Ivp Dye [Iodinated Diagnostic Agents] Anaphylaxis and Other (See Comments)    RESPIRATORY ARREST  AND Pt reports serious reaction in 1972 at [facility] (PATIENT WAS TOLD THAT IF IT WAS ADMINISTERED TO HIM AGAIN HE "WILL DIE")  . Metrizamide Anaphylaxis and Other (See Comments)    RESPIRATORY ARREST AND Pt reports serious reaction in 1972 at [facility] (PATIENT WAS TOLD THAT IF IT WAS ADMINISTERED TO HIM AGAIN HE "WILL DIE")  . Sertraline Hcl Diarrhea and Other (See Comments)    Also hallucinations, made patient feel trapped, and increased anxiety    Pertinent  Health Maintenance Due  Topic Date Due  . FOOT EXAM  Never done  . OPHTHALMOLOGY EXAM  Never done  . URINE MICROALBUMIN  Never done  . COLONOSCOPY (Pts 45-59yrs Insurance coverage will need to be confirmed)  Never done  . PNA vac Low Risk Adult (2 of 2 - PCV13) 12/20/2016  .  HEMOGLOBIN A1C  04/23/2020  . INFLUENZA VACCINE  07/25/2020    Medications: Allergies as of 02/16/2021      Reactions   Ivp Dye [iodinated Diagnostic Agents] Anaphylaxis, Other (See Comments)   RESPIRATORY ARREST AND [facility] (PATIENT WAS TOLD THAT IF IT WAS ADMINISTERED TO HIM AGAIN HE "WILL DIE")   Metrizamide Anaphylaxis, Other (See Comments)   RESPIRATORY ARREST AND Pt [facility] (PATIENT WAS TOLD THAT IF IT WAS ADMINISTERED TO HIM AGAIN HE "WILL DIE")   Sertraline Hcl Diarrhea, Other (See Comments)   Also hallucinations, made patient feel trapped, and increased anxiety      Medication List       Accurate as of February 16, 2021  4:34 PM. If you have any questions, ask your nurse or doctor.        acetaminophen 500 MG tablet Commonly known as: TYLENOL Take 1,000 mg by mouth 3 (three) times daily as needed.   acetaminophen 500 MG tablet Commonly known as: TYLENOL Take 1,000 mg by mouth every 8 (eight) hours. For pain and headache   alum & mag hydroxide-simeth 200-200-20 MG/5ML suspension Commonly known as: MAALOX/MYLANTA Take 30  mLs by mouth every 2 (two) hours as needed for indigestion or heartburn. For 24 hours   apixaban 5 MG Tabs tablet Commonly known as: ELIQUIS Take 5 mg by mouth 2 (two) times daily.   carvedilol 12.5 MG tablet Commonly known as: COREG Take 12.5 mg by mouth 2 (two) times daily with a meal.   fenofibrate 145 MG tablet Commonly known as: TRICOR Take 145 mg by mouth daily.   gabapentin 300 MG capsule Commonly known as: NEURONTIN Take 300 mg by mouth daily in the afternoon. As Middle Dose for Neuropathy   gabapentin 400 MG capsule Commonly known as: NEURONTIN Take 400 mg by mouth 2 (two) times daily at 10 AM and 5 PM.   melatonin 5 MG Tabs Take 5 mg by mouth at bedtime. For insomnia   MiraLax 17 g packet Generic drug: polyethylene glycol Take 17 g by mouth daily as needed.   OXYGEN Inhale into the lungs. Supplemental Oxygen 2-6 Liters / Min As Needed for Headache   PRO-STAT 64 PO Take 30 mLs by mouth 2 (two) times daily. To aid in wound healing       Review of Systems  Constitutional: Negative for activity change, appetite change and fever.  HENT: Negative for congestion, hearing loss and trouble swallowing.   Eyes: Negative for photophobia and visual disturbance.  Respiratory: Negative for cough, shortness of breath and wheezing.   Cardiovascular: Negative for chest pain and leg swelling.  Gastrointestinal: Negative for abdominal distention, abdominal pain, constipation, diarrhea and nausea.  Genitourinary: Negative for dysuria, frequency and hematuria.  Musculoskeletal: Negative for arthralgias and myalgias.  Skin:       Pressure injury  Neurological: Positive for weakness and headaches. Negative for dizziness.  Hematological: Bruises/bleeds easily.  Psychiatric/Behavioral: Negative for confusion, dysphoric mood and suicidal ideas. The patient is not nervous/anxious.     Vitals:   02/16/21 1628  BP: 118/72  Pulse: 70  Resp: 18  Temp: (!) 97.2 F (36.2 C)   Weight: 222 lb 9.6 oz (101 kg)  Height: 5\' 11"  (1.803 m)   Body mass index is 31.05 kg/m. Physical Exam Vitals reviewed.  Constitutional:      General: He is not in acute distress. HENT:  Head: Normocephalic.     Right Ear: There is no impacted cerumen.     Left Ear: There is no impacted cerumen.     Nose: Nose normal.     Mouth/Throat:     Mouth: Mucous membranes are moist.  Eyes:     General:        Right eye: No discharge.        Left eye: No discharge.  Cardiovascular:     Rate and Rhythm: Normal rate and regular rhythm.     Pulses: Normal pulses.     Heart sounds: Normal heart sounds. No murmur heard.   Pulmonary:     Effort: Pulmonary effort is normal. No respiratory distress.     Breath sounds: Normal breath sounds. No wheezing.  Abdominal:     General: Abdomen is flat. Bowel sounds are normal. There is no distension.     Palpations: Abdomen is soft.     Tenderness: There is no abdominal tenderness.  Musculoskeletal:     Cervical back: Normal range of motion.     Right lower leg: Edema present.     Left lower leg: Edema present.     Comments: Non-pitting  Lymphadenopathy:     Cervical: No cervical adenopathy.  Skin:    General: Skin is warm and dry.     Capillary Refill: Capillary refill takes less than 2 seconds.     Comments: ".UNSTAGEABLE PRESSURE INJURY TO RIGHT LOWER BACK: measures 6.0 x 3.0 x 0.0cm, wound bed obscured with black eschar tissue. No exudate noted. No indications of infection; no erythema, or induration noted. Periwound intact. No periwound maceration noted. Denies pain/discomfort with wound care. "  Neurological:     General: No focal deficit present.     Mental Status: He is alert and oriented to person, place, and time.     Motor: Weakness present.     Gait: Gait abnormal.     Comments: walker  Psychiatric:        Mood and Affect: Mood normal.        Behavior: Behavior normal.     Labs reviewed: Basic Metabolic Panel: Recent  Labs    02/15/21 0000  NA 140  K 4.4  CL 106  CO2 28*  BUN 12  CREATININE 0.9  CALCIUM 9.1   Liver Function Tests: No results for input(s): AST, ALT, ALKPHOS, BILITOT, PROT, ALBUMIN in the last 8760 hours. No results for input(s): LIPASE, AMYLASE in the last 8760 hours. No results for input(s): AMMONIA in the last 8760 hours. CBC: Recent Labs    02/15/21 0000  WBC 6.9  HGB 13.5  HCT 41  PLT 183   Cardiac Enzymes: No results for input(s): CKTOTAL, CKMB, CKMBINDEX, TROPONINI in the last 8760 hours. BNP: Invalid input(s): POCBNP CBG: No results for input(s): GLUCAP in the last 8760 hours.  Procedures and Imaging Studies During Stay: No results found.  Assessment/Plan:   1. Orthostatic hypotension - stable, he denies dizziness - furosemide discontinued during hospitalization - cbc/diff- future  2. Lower leg DVT (deep venous thromboembolism), chronic, bilateral (HCC) - no chest pain or sob - cont eliquis  3. Venous insufficiency - lower legs with non-pitting edema, no sores - recommend leg elevation   4. Cerebrovascular disease - MRI with small vessel cerebrovascular disease  5. Mixed hyperlipidemia - stable cont fenofibrate  6. Type 2 diabetes mellitus with neurological complications (HCC) - daily blood sugar 100-150's - cont gabapentin for neuropathy- PM dose increased to  400 mg - bmp- future  7. 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 Acute post-traumatic headache, not intractable - improved wit prn tylenol - recommend warm compress and showers    Patient is being discharged with the following home health services: PT/OT/Nuring   Patient is being discharged with the following durable medical equipment:None    Patient has been advised to f/u with their PCP in 1-2 weeks to bring them up to date on their rehab stay.  Social  services at facility was responsible for arranging this appointment.  Pt was provided with a 30 day supply of prescriptions for medications and refills must be obtained from their PCP.  For controlled substances, a more limited supply may be provided adequate until PCP appointment only.  Future labs/tests needed:  Cbc/diff, bmp

## 2022-11-14 ENCOUNTER — Emergency Department (HOSPITAL_COMMUNITY)
Admission: EM | Admit: 2022-11-14 | Discharge: 2022-11-14 | Disposition: A | Discharge status: Home / Self Care | Payer: Medicare PPO | Attending: Emergency Medicine | Admitting: Emergency Medicine

## 2022-11-14 ENCOUNTER — Emergency Department (HOSPITAL_COMMUNITY): Payer: Medicare PPO

## 2022-11-14 ENCOUNTER — Other Ambulatory Visit: Payer: Self-pay

## 2022-11-14 ENCOUNTER — Encounter (HOSPITAL_COMMUNITY): Payer: Self-pay

## 2022-11-14 ENCOUNTER — Emergency Department (HOSPITAL_COMMUNITY)
Admission: EM | Admit: 2022-11-14 | Discharge: 2022-11-15 | Disposition: A | Discharge status: Home / Self Care | Payer: Medicare PPO | Source: Home / Self Care | Attending: Emergency Medicine | Admitting: Emergency Medicine

## 2022-11-14 DIAGNOSIS — G8929 Other chronic pain: Secondary | ICD-10-CM | POA: Diagnosis not present

## 2022-11-14 DIAGNOSIS — E1149 Type 2 diabetes mellitus with other diabetic neurological complication: Secondary | ICD-10-CM | POA: Diagnosis not present

## 2022-11-14 DIAGNOSIS — W19XXXA Unspecified fall, initial encounter: Secondary | ICD-10-CM

## 2022-11-14 DIAGNOSIS — I10 Essential (primary) hypertension: Secondary | ICD-10-CM | POA: Insufficient documentation

## 2022-11-14 DIAGNOSIS — S0990XA Unspecified injury of head, initial encounter: Secondary | ICD-10-CM | POA: Insufficient documentation

## 2022-11-14 DIAGNOSIS — W010XXA Fall on same level from slipping, tripping and stumbling without subsequent striking against object, initial encounter: Secondary | ICD-10-CM | POA: Insufficient documentation

## 2022-11-14 DIAGNOSIS — Z79899 Other long term (current) drug therapy: Secondary | ICD-10-CM | POA: Insufficient documentation

## 2022-11-14 DIAGNOSIS — E119 Type 2 diabetes mellitus without complications: Secondary | ICD-10-CM | POA: Insufficient documentation

## 2022-11-14 DIAGNOSIS — M545 Low back pain, unspecified: Secondary | ICD-10-CM | POA: Insufficient documentation

## 2022-11-14 DIAGNOSIS — Z7901 Long term (current) use of anticoagulants: Secondary | ICD-10-CM | POA: Insufficient documentation

## 2022-11-14 DIAGNOSIS — R531 Weakness: Secondary | ICD-10-CM | POA: Diagnosis not present

## 2022-11-14 DIAGNOSIS — R296 Repeated falls: Secondary | ICD-10-CM | POA: Diagnosis not present

## 2022-11-14 DIAGNOSIS — W06XXXA Fall from bed, initial encounter: Secondary | ICD-10-CM | POA: Diagnosis not present

## 2022-11-14 DIAGNOSIS — S39012A Strain of muscle, fascia and tendon of lower back, initial encounter: Secondary | ICD-10-CM

## 2022-11-14 DIAGNOSIS — Z8673 Personal history of transient ischemic attack (TIA), and cerebral infarction without residual deficits: Secondary | ICD-10-CM | POA: Insufficient documentation

## 2022-11-14 DIAGNOSIS — Y92003 Bedroom of unspecified non-institutional (private) residence as the place of occurrence of the external cause: Secondary | ICD-10-CM | POA: Insufficient documentation

## 2022-11-14 MED ORDER — ONDANSETRON HCL 4 MG/2ML IJ SOLN: 4.0000 mg | Freq: Once | INTRAMUSCULAR | Status: DC

## 2022-11-14 MED ORDER — OXYCODONE HCL 5 MG PO TABS
5.0000 mg | ORAL_TABLET | Freq: Once | ORAL | Status: AC
  Administered 2022-11-14: 5 mg via ORAL
  Filled 2022-11-14: qty 1

## 2022-11-14 MED ORDER — ACETAMINOPHEN 500 MG PO TABS
1000.0000 mg | ORAL_TABLET | Freq: Once | ORAL | Status: AC
  Administered 2022-11-14: 1000 mg via ORAL
  Filled 2022-11-14: qty 2

## 2022-11-14 MED ORDER — MORPHINE SULFATE (PF) 4 MG/ML IV SOLN: 4.0000 mg | Freq: Once | INTRAVENOUS | Status: DC

## 2022-11-14 MED ORDER — LIDOCAINE 5 % EX PTCH
1.0000 | MEDICATED_PATCH | CUTANEOUS | Status: DC
  Administered 2022-11-14: 1 via TRANSDERMAL
  Filled 2022-11-14: qty 1

## 2022-11-14 MED ORDER — MORPHINE SULFATE (PF) 4 MG/ML IV SOLN
4.0000 mg | Freq: Once | INTRAVENOUS | Status: AC
  Administered 2022-11-14: 4 mg via INTRAMUSCULAR
  Filled 2022-11-14: qty 1

## 2022-11-14 MED ORDER — METHYLPREDNISOLONE 4 MG PO TBPK: ORAL_TABLET | ORAL | 0 refills | Status: AC

## 2022-11-14 NOTE — ED Triage Notes (Signed)
Arrives EMS from home with c/o chronic lower back pain. Was seen at [facility] ED this morning for same.

## 2022-11-14 NOTE — Discharge Instructions (Addendum)
Happy belated birthday! You were seen today for a fall out of your bed in the background of having spinal stenosis seen on prior CT imaging and reasons for chronic back pain. I do not see signs of new traumatic injury today.  I recommend you use a bedrail as we discussed. Recommend the medrol dosepak, taking your other prescribed pain medications, lidocaine patch for pain and following up with the spinal doctor and primary care doctor regarding your pain.

## 2022-11-14 NOTE — ED Triage Notes (Signed)
Pt arrives from home via [facility] for back pain after falling out of bed this morning. Pt was able to get himself into his wheelchair prior to EMS arrival. No LOC. Pt denies hitting his head on anything. Pt is on Plavix.

## 2022-11-14 NOTE — ED Notes (Signed)
Help get patient undressed on the monitor patient is resting with call bell in reach 

## 2022-11-14 NOTE — ED Provider Notes (Signed)
[facility] EMERGENCY DEPARTMENT Provider Note   CSN: [id number] Arrival date & time: 11/14/22  0827     History  Chief Complaint  Patient presents with   George Frank is a 71 y.o. male.  HPI     71 year old male with a history of diabetes, hypertension, hyperlipidemia, o, CVA, DVT in August, spinal stenosis with back pain and multiple falls out of bed over last few weeks who presents with fall out of bed.  Reports he was watching TV and went to roll over to get the remote and fell out of bed. No LOC.  His back pain is worse now. He was able to get up into his wheelchair and call EMS> Typically uses wheelchair or walker to get around at home.  Feels sensation of weakness/numbness in back but denies this in legs. No loss control of bowel or bladder. Pain was an 8 but decreased after fentanyl given by EMS, now increasing again.  No headache, neck pain, chest pain, abdominal pain, nausea or vomiting. Reports dilaudid made him confused in the past but morphine has worked for his pain.  He reports they took him off of plavix and is on only eliquis right now.  Unclear on chart review if he completed 3 mos of eliquis for DVT on 11/13 and is now on plavix only.  (EMS had reported plavix, pt reports on eliquis)    He was seen in the [facility] emergency department on November 13 after a similar fall out of his bed.  At that time he had reported he fell out of bed 3 times in the last week prior to that.  He had CT thoracic and lumbar spine which showed no fracture, did show severe stenosis which had been demonstrated in the past, was also seen on November 9 at which time he had full trauma scans and blood work and was recommended follow-up with a spine specialist.  Past Medical History:  Diagnosis Date   Anxiety    Arthritis    "knees" (12/20/2015)   Aspiration pneumonia (HCC) 12/10/2015   Depression    Diabetes mellitus without complication (HCC)    DVT (deep  venous thrombosis) (HCC) 1990s   LLE   Enlarged heart    GERD (gastroesophageal reflux disease)    HCAP (healthcare-associated pneumonia) 12/20/2015   Hyperlipidemia    Hypertension    Kidney stones    "passed them"   On home oxygen therapy    "2L 24/7 last week; stopped 12/17/2015; dr said I could use it prn; had to use it again 12/19/2015"   Phlebitis    Sciatica    Sciatica of right side    Sleep apnea    Stroke (HCC) 12/10/2015   denies residual on 12/20/2015     Home Medications Prior to Admission medications   Medication Sig Start Date End Date Taking? Authorizing Provider  methylPREDNISolone (MEDROL DOSEPAK) 4 MG TBPK tablet Follow instructions on package 11/14/22  Yes Alvira Monday, MD  acetaminophen (TYLENOL) 500 MG tablet Take 1,000 mg by mouth 3 (three) times daily as needed.    [provider]  acetaminophen (TYLENOL) 500 MG tablet Take 1,000 mg by mouth every 8 (eight) hours. For pain and headache    [provider]  alum & mag hydroxide-simeth (MAALOX/MYLANTA) 200-200-20 MG/5ML suspension Take 30 mLs by mouth every 2 (two) hours as needed for indigestion or heartburn. For 24 hours    [provider]  Amino Acids-Protein Hydrolys (PRO-STAT 64 PO) Take 30 mLs by mouth 2 (two) times daily. To aid in wound healing    [provider]  apixaban (ELIQUIS) 5 MG TABS tablet Take 1 tablet (5 mg total) by mouth 2 (two) times daily. 02/16/21   Fargo, Amy E, NP  carvedilol (COREG) 12.5 MG tablet Take 1 tablet (12.5 mg total) by mouth 2 (two) times daily with a meal. 02/16/21   Fargo, Amy E, NP  fenofibrate (TRICOR) 145 MG tablet Take 1 tablet (145 mg total) by mouth daily. 02/16/21   Fargo, Amy E, NP  gabapentin (NEURONTIN) 300 MG capsule Take 1 capsule (300 mg total) by mouth daily in the afternoon. Middle Dose for Neuropathy 02/16/21   Octavia Heir, NP  gabapentin (NEURONTIN) 400 MG capsule Take 1 capsule (400 mg total) by mouth 2 (two) times daily  at 10 AM and 5 PM. 02/16/21   Fargo, Amy E, NP  melatonin 5 MG TABS Take 5 mg by mouth at bedtime. For insomnia    [provider]  OXYGEN Inhale into the lungs. Supplemental Oxygen 2-6 Liters / Min As Needed for Headache    [provider]  polyethylene glycol (MIRALAX) 17 g packet Take 17 g by mouth daily as needed.    [provider]      Allergies    Ivp dye [iodinated contrast media], Metrizamide, and Sertraline hcl    Review of Systems   Review of Systems  Physical Exam Updated Vital Signs BP (!) 146/84 (BP Location: Right Arm)   Pulse 63   Temp 97.9 F (36.6 C) (Oral)   Resp 16   Ht 5\' 11"  (1.803 m)   Wt 115.7 kg   SpO2 97%   BMI 35.57 kg/m  Physical Exam Vitals and nursing note reviewed.  Constitutional:      General: He is not in acute distress.    Appearance: He is well-developed. He is not diaphoretic.  HENT:     Head: Normocephalic and atraumatic.  Eyes:     Conjunctiva/sclera: Conjunctivae normal.  Cardiovascular:     Rate and Rhythm: Normal rate and regular rhythm.     Heart sounds: Normal heart sounds. No murmur heard.    No friction rub. No gallop.  Pulmonary:     Effort: Pulmonary effort is normal. No respiratory distress.     Breath sounds: Normal breath sounds. No wheezing or rales.  Abdominal:     General: There is no distension.     Palpations: Abdomen is soft.     Tenderness: There is no abdominal tenderness. There is no guarding.  Musculoskeletal:        General: Tenderness (diffuse midline thoracic and lumbar spine, bilateral lumbar) present.     Cervical back: Normal range of motion.  Skin:    General: Skin is warm and dry.  Neurological:     Mental Status: He is alert and oriented to person, place, and time.     ED Results / Procedures / Treatments   Labs (all labs ordered are listed, but only abnormal results are displayed) Labs Reviewed - No data to display  EKG EKG Interpretation  Date/Time:  Tuesday  November 14 2022 08:37:24 EST Ventricular Rate:  65 PR Interval:  235 QRS Duration: 154 QT Interval:  446 QTC Calculation: 464 R Axis:   205 Text Interpretation: Sinus rhythm Prolonged PR interval Right bundle branch block No significant change since last tracing Confirmed by 08-11-1999 (Alvira Monday)  on 11/14/2022 9:34:49 AM  Radiology CT Head Wo Contrast  Result Date: 11/14/2022 CLINICAL DATA:  Head trauma.  Fall. EXAM: CT HEAD WITHOUT CONTRAST TECHNIQUE: Contiguous axial images were obtained from the base of the skull through the vertex without intravenous contrast. RADIATION DOSE REDUCTION: This exam was performed according to the departmental dose-optimization program which includes automated exposure control, adjustment of the mA and/or kV according to patient size and/or use of iterative reconstruction technique. COMPARISON:  11/02/2022 FINDINGS: Brain: No evidence of acute infarction, hemorrhage, hydrocephalus, extra-axial collection or mass lesion/mass effect. Vascular: Prominence of the sulci and ventricles compatible with brain atrophy. There is mild diffuse low-attenuation within the subcortical and periventricular white matter compatible with chronic microvascular disease. Skull: Normal. Negative for fracture or focal lesion. Sinuses/Orbits: Mucosal thickening identified within the right maxillary sinus. No sinus fluid levels identified. Mastoid air cells are clear. Other: None. IMPRESSION: 1. No acute intracranial abnormalities. 2. Chronic microvascular disease and brain atrophy. Electronically Signed   By: Signa Kell M.D.   On: 11/14/2022 10:25   DG Lumbar Spine Complete  Result Date: 11/14/2022 CLINICAL DATA:  Low back pain after fall 3 weeks ago. EXAM: LUMBAR SPINE - COMPLETE 4+ VIEW COMPARISON:  None Available. FINDINGS: There is no evidence of lumbar spine fracture. Alignment is normal. Intervertebral disc spaces are maintained. Mild to moderate anterior osteophyte formation  is noted at all levels of the lumbar spine. IMPRESSION: Mild to moderate multilevel degenerative changes are noted. No acute abnormality is noted. Aortic Atherosclerosis (ICD10-I70.0). Electronically Signed   By: Lupita Raider M.D.   On: 11/14/2022 10:12   DG Thoracic Spine 2 View  Result Date: 11/14/2022 CLINICAL DATA:  Upper back pain after fall 3 weeks ago. EXAM: THORACIC SPINE 2 VIEWS COMPARISON:  October 25, 2019. FINDINGS: No fracture or spondylolisthesis is noted. Moderate osteophyte formation is noted at multiple levels in the middle and lower thoracic spine. IMPRESSION: Multilevel degenerative changes as described above. No acute abnormality seen. Electronically Signed   By: Lupita Raider M.D.   On: 11/14/2022 10:10    Procedures Procedures    Medications Ordered in ED Medications  lidocaine (LIDODERM) 5 % 1 patch (1 patch Transdermal Patch Applied 11/14/22 0919)  ondansetron (ZOFRAN) injection 4 mg (has no administration in time range)  morphine (PF) 4 MG/ML injection 4 mg (has no administration in time range)  acetaminophen (TYLENOL) tablet 1,000 mg (1,000 mg Oral Given 11/14/22 0918)  oxyCODONE (Oxy IR/ROXICODONE) immediate release tablet 5 mg (5 mg Oral Given 11/14/22 3557)    ED Course/ Medical Decision Making/ A&P                            71 year old male with a history of diabetes, hypertension, hyperlipidemia, o, CVA, DVT in August, spinal stenosis with back pain and multiple falls out of bed over last few weeks who presents with fall out of bed.  Has had multiple presentations for similar at other facilities, had CT T/L spine 11/13 which showed stenosis without fracture.  He does not appear to be focally tender on exam, more diffuse pain.  He does not appear to have occult weakness/numbness of lower extremities, has normal pulses. No loss of control of bowel or bladder, do not suspect cauda equina.  Consider epidural hematoma given anticoagulation but does not appear  to have acute weakness and has noted spinal stenosis and feel continued outpatient evaluation with spine specialist is  appropriate. No contusion on back or hemodynamic instability and doubt retroperitoneal hematoma.   CT head ordered given fall out of bed on anticoagulation shows no acute abnormalities.  XR T and L spine without acute fracture.  Given tylenol, oxycodone, lidocaine patch. Is requesting morphine. Does have history of significant opiate dependence in the past, per pt was on oxycodone 20, then was on suboxone and weaned off of that, now is taking oxycodone again since back pain began increasing.  He does likely have significant pain from his back pathology in the setting of poor pain tolerance from opiate use. Given dose of morphine to help with pain. Given rx for medrol dose pack and encourage him using bed rails, following up closely with spine doctors and PCP.           Final Clinical Impression(s) / ED Diagnoses Final diagnoses:  Fall, initial encounter  Chronic low back pain without sciatica, unspecified back pain laterality    Rx / DC Orders ED Discharge Orders          Ordered    methylPREDNISolone (MEDROL DOSEPAK) 4 MG TBPK tablet        11/14/22 1119              Alvira MondaySchlossman, Genevieve Arbaugh, MD 11/14/22 1159

## 2022-11-15 ENCOUNTER — Emergency Department (HOSPITAL_COMMUNITY): Admission: EM | Admit: 2022-11-15 | Payer: Medicare PPO | Source: Home / Self Care

## 2022-11-15 ENCOUNTER — Emergency Department (HOSPITAL_COMMUNITY): Payer: Medicare PPO

## 2022-11-15 MED ORDER — LIDOCAINE 5 % EX PTCH
1.0000 | MEDICATED_PATCH | CUTANEOUS | Status: DC
  Administered 2022-11-15: 1 via TRANSDERMAL
  Filled 2022-11-15: qty 1

## 2022-11-15 MED ORDER — ACETAMINOPHEN 500 MG PO TABS
1000.0000 mg | ORAL_TABLET | Freq: Once | ORAL | Status: AC
  Administered 2022-11-15: 1000 mg via ORAL
  Filled 2022-11-15: qty 2

## 2022-11-15 NOTE — Discharge Instructions (Addendum)
For pain control you may take 1000 mg of Tylenol every 8 hours as needed.  

## 2022-11-15 NOTE — ED Notes (Signed)
Pt called out for a urinal and tech gave him a urinal, then he called out again stating he urinated on the floor

## 2022-11-15 NOTE — ED Provider Notes (Signed)
[place] DEPT Provider Note  CSN: [id number] Arrival date & time: 11/14/22 2044  Chief Complaint(s) Back Pain  HPI George Frank is a 71 y.o. male with a past medical history listed below who presents to the emergency department with lower back pain.  He reports that he fell earlier this morning and was evaluated at [facility] and had negative x-rays.  After going home early this afternoon, patient reports that he fell again prompting his visit tonight.  He reports that he slipped and fell in his bedroom and landed on his buttocks.  He denied any head trauma or loss of consciousness.  He denies any neck pain, upper or mid back pain.  Pain is located in lumbar region described as a ache worse with movement and palpation.  Alleviated by immobility.  He denies any urinary or bowel continence.  No lower extremity weakness or loss of sensation.  The history is provided by the patient.    Past Medical History Past Medical History:  Diagnosis Date   Anxiety    Arthritis    "knees" (12/20/2015)   Aspiration pneumonia ([place]) 12/10/2015   Depression    Diabetes mellitus without complication ([place])    DVT (deep venous thrombosis) ([place]) 1990s   LLE   Enlarged heart    GERD (gastroesophageal reflux disease)    HCAP (healthcare-associated pneumonia) 12/20/2015   Hyperlipidemia    Hypertension    Kidney stones    "passed them"   On home oxygen therapy    "2L 24/7 last week; stopped 12/17/2015; dr said I could use it prn; had to use it again 12/19/2015"   Phlebitis    Sciatica    Sciatica of right side    Sleep apnea    Stroke ([place]) 12/10/2015   denies residual on 12/20/2015   Patient Active Problem List   Diagnosis Date Noted   Lower leg DVT (deep venous thromboembolism), chronic, bilateral ([place]) 02/15/2021   Venous insufficiency 02/15/2021   Cerebrovascular disease 02/15/2021   Unstageable pressure ulcer of right lower back ([place]) 02/15/2021   Shortness  of breath 10/24/2019   GERD (gastroesophageal reflux disease) 10/24/2019   Phlebitis 10/24/2019   DVT (deep venous thrombosis) ([place]) 10/24/2019   Fever of unknown origin 10/24/2019   Adrenal insufficiency ([place]) 09/15/2017   Anemia 09/13/2017   Orthostatic hypotension 09/12/2017   Acute deep vein thrombosis (DVT) of proximal vein of left lower extremity ([place]) 09/12/2017   AKI (acute kidney injury) ([place]) 09/12/2017   Mixed hyperlipidemia 02/23/2017   Type 2 diabetes mellitus with neurological complications ([place]) [id number]   Lacunar infarct, acute ([place]) 02/21/2016   HCAP (healthcare-associated pneumonia) 99991111   Illicit drug use [id number]   Pneumonia 12/20/2015   Drug abuse ([place])    Acute respiratory failure with hypoxia ([place]) 12/12/2015   Aspiration pneumonia ([place]) 12/12/2015   OSA (obstructive sleep apnea) 12/12/2015   CVA (cerebral infarction) 12/10/2015   Stroke with cerebral ischemia ([place]) 12/10/2015   Essential hypertension 12/10/2015   Leucocytosis 12/10/2015   Stroke (cerebrum) ([place]) 12/10/2015   Home Medication(s) Prior to Admission medications   Medication Sig Start Date End Date Taking? Authorizing Provider  acetaminophen (TYLENOL) 500 MG tablet Take 1,000 mg by mouth 3 (three) times daily as needed.    [provider]  acetaminophen (TYLENOL) 500 MG tablet Take 1,000 mg by mouth every 8 (eight) hours. For pain and headache    [provider]  alum & mag hydroxide-simeth (MAALOX/MYLANTA) 200-200-20 MG/5ML suspension  Take 30 mLs by mouth every 2 (two) hours as needed for indigestion or heartburn. For 24 hours    [provider]  Amino Acids-Protein Hydrolys (PRO-STAT 64 PO) Take 30 mLs by mouth 2 (two) times daily. To aid in wound healing    [provider]  apixaban (ELIQUIS) 5 MG TABS tablet Take 1 tablet (5 mg total) by mouth 2 (two) times daily. 02/16/21   Fargo, Amy E, NP  carvedilol (COREG) 12.5 MG tablet Take 1 tablet (12.5 mg  total) by mouth 2 (two) times daily with a meal. 02/16/21   Fargo, Amy E, NP  fenofibrate (TRICOR) 145 MG tablet Take 1 tablet (145 mg total) by mouth daily. 02/16/21   Fargo, Amy E, NP  gabapentin (NEURONTIN) 300 MG capsule Take 1 capsule (300 mg total) by mouth daily in the afternoon. Middle Dose for Neuropathy 02/16/21   Yvonna Alanis, NP  gabapentin (NEURONTIN) 400 MG capsule Take 1 capsule (400 mg total) by mouth 2 (two) times daily at 10 AM and 5 PM. 02/16/21   Fargo, Amy E, NP  melatonin 5 MG TABS Take 5 mg by mouth at bedtime. For insomnia    [provider]  methylPREDNISolone (MEDROL DOSEPAK) 4 MG TBPK tablet Follow instructions on package 11/14/22   Gareth Morgan, MD  OXYGEN Inhale into the lungs. Supplemental Oxygen 2-6 Liters / Min As Needed for Headache    [provider]  polyethylene glycol (MIRALAX) 17 g packet Take 17 g by mouth daily as needed.    [provider]                                                                                                                                    Allergies Iodinated contrast media, Metrizamide, Sertraline hcl, and Sertraline  Review of Systems Review of Systems As noted in HPI  Physical Exam Vital Signs  I have reviewed the triage vital signs BP (!) 172/138   Pulse 68   Temp 97.7 F (36.5 C) (Oral)   Resp 18   Ht 5\' 11"  (1.803 m)   Wt 113.4 kg   SpO2 (!) 87%   BMI 34.87 kg/m   Physical Exam Constitutional:      General: He is not in acute distress.    Appearance: He is well-developed. He is not diaphoretic.  HENT:     Head: Normocephalic.     Right Ear: External ear normal.     Left Ear: External ear normal.  Eyes:     General: No scleral icterus.       Right eye: No discharge.        Left eye: No discharge.     Conjunctiva/sclera: Conjunctivae normal.     Pupils: Pupils are equal, round, and reactive to light.  Cardiovascular:     Rate and Rhythm: Regular rhythm.     Pulses:  Radial pulses are 2+ on the right side and 2+ on the left side.       Dorsalis pedis pulses are 2+ on the right side and 2+ on the left side.     Heart sounds: Normal heart sounds. No murmur heard.    No friction rub. No gallop.  Pulmonary:     Effort: Pulmonary effort is normal. No respiratory distress.     Breath sounds: Normal breath sounds. No stridor.  Abdominal:     General: There is no distension.     Palpations: Abdomen is soft.     Tenderness: There is no abdominal tenderness.  Musculoskeletal:     Cervical back: Normal range of motion and neck supple. No bony tenderness.     Thoracic back: No bony tenderness.     Lumbar back: Tenderness present. No spasms or bony tenderness.       Back:     Comments: Clavicle stable. Chest stable to AP/Lat compression. Pelvis stable to Lat compression. No obvious extremity deformity. No chest or abdominal wall contusion.  Skin:    General: Skin is warm.  Neurological:     Mental Status: He is alert and oriented to person, place, and time.     GCS: GCS eye subscore is 4. GCS verbal subscore is 5. GCS motor subscore is 6.     Comments: Spine Exam: Strength: 5/5 throughout LE bilaterally (hip flexion/extension, adduction/abduction; knee flexion/extension; foot dorsiflexion/plantarflexion, inversion/eversion; great toe inversion) Sensation: Intact to light touch in proximal and distal LE bilaterally Reflexes: 1+ quadriceps and achilles reflexes       ED Results and Treatments Labs (all labs ordered are listed, but only abnormal results are displayed) Labs Reviewed - No data to display                                                                                                                       EKG  EKG Interpretation  Date/Time:    Ventricular Rate:    PR Interval:    QRS Duration:   QT Interval:    QTC Calculation:   R Axis:     Text Interpretation:         Radiology DG Lumbar Spine Complete  Result Date:  11/15/2022 CLINICAL DATA:  Fall EXAM: LUMBAR SPINE - COMPLETE 4+ VIEW COMPARISON:  11/14/2022 FINDINGS: There is no evidence of lumbar spine fracture. Minimal levocurvature. No listhesis. Multilevel degenerative changes with disc height loss and anterior osteophyte formation. IMPRESSION: No acute osseous abnormality. Electronically Signed   By: Wiliam Ke M.D.   On: 11/15/2022 03:17   CT Head Wo Contrast  Result Date: 11/14/2022 CLINICAL DATA:  Head trauma.  Fall. EXAM: CT HEAD WITHOUT CONTRAST TECHNIQUE: Contiguous axial images were obtained from the base of the skull through the vertex without intravenous contrast. RADIATION DOSE REDUCTION: This exam was performed according to the departmental dose-optimization program which includes automated exposure control, adjustment of the mA and/or kV according to patient size  and/or use of iterative reconstruction technique. COMPARISON:  11/02/2022 FINDINGS: Brain: No evidence of acute infarction, hemorrhage, hydrocephalus, extra-axial collection or mass lesion/mass effect. Vascular: Prominence of the sulci and ventricles compatible with brain atrophy. There is mild diffuse low-attenuation within the subcortical and periventricular white matter compatible with chronic microvascular disease. Skull: Normal. Negative for fracture or focal lesion. Sinuses/Orbits: Mucosal thickening identified within the right maxillary sinus. No sinus fluid levels identified. Mastoid air cells are clear. Other: None. IMPRESSION: 1. No acute intracranial abnormalities. 2. Chronic microvascular disease and brain atrophy. Electronically Signed   By: Kerby Moors M.D.   On: 11/14/2022 10:25   DG Lumbar Spine Complete  Result Date: 11/14/2022 CLINICAL DATA:  Low back pain after fall 3 weeks ago. EXAM: LUMBAR SPINE - COMPLETE 4+ VIEW COMPARISON:  None Available. FINDINGS: There is no evidence of lumbar spine fracture. Alignment is normal. Intervertebral disc spaces are maintained.  Mild to moderate anterior osteophyte formation is noted at all levels of the lumbar spine. IMPRESSION: Mild to moderate multilevel degenerative changes are noted. No acute abnormality is noted. Aortic Atherosclerosis (ICD10-I70.0). Electronically Signed   By: Marijo Conception M.D.   On: 11/14/2022 10:12   DG Thoracic Spine 2 View  Result Date: 11/14/2022 CLINICAL DATA:  Upper back pain after fall 3 weeks ago. EXAM: THORACIC SPINE 2 VIEWS COMPARISON:  October 25, 2019. FINDINGS: No fracture or spondylolisthesis is noted. Moderate osteophyte formation is noted at multiple levels in the middle and lower thoracic spine. IMPRESSION: Multilevel degenerative changes as described above. No acute abnormality seen. Electronically Signed   By: Marijo Conception M.D.   On: 11/14/2022 10:10    Medications Ordered in ED Medications  lidocaine (LIDODERM) 5 % 1 patch (1 patch Transdermal Patch Applied 11/15/22 0336)  acetaminophen (TYLENOL) tablet 1,000 mg (1,000 mg Oral Given 11/15/22 0336)                                                                                                                                     Procedures Procedures  (including critical care time)  Medical Decision Making / ED Course   Medical Decision Making Amount and/or Complexity of Data Reviewed Radiology: ordered and independent interpretation performed. Decision-making details documented in ED Course.  Risk OTC drugs. Prescription drug management.    Patient presents with low back pain following mechanical fall. To palpation over the paraspinal musculature. Neurovascular intact distally.  Not concerning for cauda equina. Will  repeat x-ray of the lumbar spine to rule out fracture.  No head trauma or headache concerning for ICH.  Provided with oral Tylenol and lidocaine patch  X-rays negative Pain improved Patient was able to ambulate without significant complication.       Final Clinical Impression(s) / ED  Diagnoses Final diagnoses:  Fall in home, initial encounter  Strain of lumbar region, initial encounter   The patient appears reasonably screened and/or  stabilized for discharge and I doubt any other medical condition or other [facility] requiring further screening, evaluation, or treatment in the ED at this time. I have discussed the findings, Dx and Tx plan with the patient/family who expressed understanding and agree(s) with the plan. Discharge instructions discussed at length. The patient/family was given strict return precautions who verbalized understanding of the instructions. No further questions at time of discharge.  Disposition: Discharge  Condition: Good  ED Discharge Orders     None         Follow Up: Lorelei Pont, MD [address] [place] [address] [phone]  Call  to schedule an appointment for close follow up           This chart was dictated using voice recognition software.  Despite best efforts to proofread,  errors can occur which can change the documentation meaning.    Fatima Blank, MD 11/15/22 [phone]
